# Patient Record
Sex: Female | Born: 1955
Health system: Southern US, Community
[De-identification: ages and names within clinical notes are randomized; demographics above are authoritative.]

## PROBLEM LIST (undated history)

## (undated) DIAGNOSIS — F419 Anxiety disorder, unspecified: Secondary | ICD-10-CM

## (undated) DIAGNOSIS — T7840XA Allergy, unspecified, initial encounter: Secondary | ICD-10-CM

## (undated) DIAGNOSIS — R42 Dizziness and giddiness: Secondary | ICD-10-CM

## (undated) DIAGNOSIS — Z8673 Personal history of transient ischemic attack (TIA), and cerebral infarction without residual deficits: Secondary | ICD-10-CM

## (undated) DIAGNOSIS — E785 Hyperlipidemia, unspecified: Secondary | ICD-10-CM

## (undated) DIAGNOSIS — C801 Malignant (primary) neoplasm, unspecified: Secondary | ICD-10-CM

## (undated) DIAGNOSIS — M1990 Unspecified osteoarthritis, unspecified site: Secondary | ICD-10-CM

## (undated) DIAGNOSIS — M810 Age-related osteoporosis without current pathological fracture: Secondary | ICD-10-CM

## (undated) DIAGNOSIS — R519 Headache, unspecified: Secondary | ICD-10-CM

## (undated) HISTORY — DX: Hyperlipidemia, unspecified: E78.5

## (undated) HISTORY — PX: TOTAL VAGINAL HYSTERECTOMY: SHX2548

## (undated) HISTORY — PX: VAGINAL HYSTERECTOMY: SUR661

## (undated) HISTORY — DX: Headache, unspecified: R51.9

## (undated) HISTORY — PX: MASTECTOMY: SHX3

## (undated) HISTORY — PX: LYMPH NODE BIOPSY: SHX201

## (undated) HISTORY — PX: COLONOSCOPY: SHX174

## (undated) HISTORY — DX: Personal history of transient ischemic attack (TIA), and cerebral infarction without residual deficits: Z86.73

## (undated) HISTORY — PX: BREAST LUMPECTOMY: SHX2

## (undated) HISTORY — PX: CARDIAC CATHETERIZATION: SHX172

## (undated) HISTORY — DX: Age-related osteoporosis without current pathological fracture: M81.0

## (undated) HISTORY — DX: Allergy, unspecified, initial encounter: T78.40XA

## (undated) HISTORY — PX: BREAST CYST ASPIRATION: SHX578

---

## 2004-04-12 DIAGNOSIS — C50919 Malignant neoplasm of unspecified site of unspecified female breast: Secondary | ICD-10-CM

## 2004-04-12 HISTORY — PX: BREAST EXCISIONAL BIOPSY: SUR124

## 2004-04-12 HISTORY — DX: Malignant neoplasm of unspecified site of unspecified female breast: C50.919

## 2004-08-12 ENCOUNTER — Ambulatory Visit: Payer: Self-pay | Admitting: Obstetrics and Gynecology

## 2004-08-20 ENCOUNTER — Ambulatory Visit: Payer: Self-pay | Admitting: General Surgery

## 2004-08-31 ENCOUNTER — Ambulatory Visit: Payer: Self-pay | Admitting: General Surgery

## 2004-08-31 ENCOUNTER — Ambulatory Visit: Payer: Self-pay | Admitting: Surgery

## 2004-09-30 ENCOUNTER — Ambulatory Visit: Payer: Self-pay | Admitting: Gynecologic Oncology

## 2005-01-06 ENCOUNTER — Ambulatory Visit: Payer: Self-pay | Admitting: General Surgery

## 2005-01-18 ENCOUNTER — Ambulatory Visit: Payer: Self-pay | Admitting: General Surgery

## 2005-01-25 ENCOUNTER — Ambulatory Visit: Payer: Self-pay | Admitting: Oncology

## 2005-05-26 ENCOUNTER — Ambulatory Visit: Payer: Self-pay | Admitting: Oncology

## 2005-06-16 ENCOUNTER — Ambulatory Visit: Payer: Self-pay | Admitting: Oncology

## 2005-06-16 ENCOUNTER — Ambulatory Visit: Payer: Self-pay | Admitting: Family Medicine

## 2005-06-22 ENCOUNTER — Encounter: Admission: RE | Admit: 2005-06-22 | Discharge: 2005-06-22 | Payer: Self-pay | Admitting: Oncology

## 2005-09-29 ENCOUNTER — Ambulatory Visit: Payer: Self-pay | Admitting: Oncology

## 2006-01-05 ENCOUNTER — Ambulatory Visit: Payer: Self-pay | Admitting: Oncology

## 2006-03-28 ENCOUNTER — Ambulatory Visit: Payer: Self-pay | Admitting: Gastroenterology

## 2006-04-29 ENCOUNTER — Ambulatory Visit: Payer: Self-pay | Admitting: Oncology

## 2006-06-29 ENCOUNTER — Ambulatory Visit: Payer: Self-pay | Admitting: Oncology

## 2006-07-06 ENCOUNTER — Ambulatory Visit: Payer: Self-pay | Admitting: Oncology

## 2006-07-11 ENCOUNTER — Ambulatory Visit: Payer: Self-pay | Admitting: Family Medicine

## 2006-07-12 ENCOUNTER — Ambulatory Visit: Payer: Self-pay | Admitting: Oncology

## 2006-08-03 ENCOUNTER — Ambulatory Visit: Payer: Self-pay | Admitting: Oncology

## 2006-12-23 ENCOUNTER — Ambulatory Visit: Payer: Self-pay | Admitting: Oncology

## 2007-01-11 ENCOUNTER — Ambulatory Visit: Payer: Self-pay | Admitting: Oncology

## 2007-02-06 ENCOUNTER — Ambulatory Visit: Payer: Self-pay | Admitting: Oncology

## 2007-06-11 ENCOUNTER — Ambulatory Visit: Payer: Self-pay | Admitting: Oncology

## 2007-06-23 ENCOUNTER — Ambulatory Visit: Payer: Self-pay | Admitting: Oncology

## 2007-08-09 ENCOUNTER — Ambulatory Visit: Payer: Self-pay | Admitting: Oncology

## 2007-12-12 ENCOUNTER — Ambulatory Visit: Payer: Self-pay | Admitting: Oncology

## 2007-12-25 ENCOUNTER — Ambulatory Visit: Payer: Self-pay | Admitting: Oncology

## 2008-01-11 ENCOUNTER — Ambulatory Visit: Payer: Self-pay | Admitting: Oncology

## 2008-02-11 ENCOUNTER — Ambulatory Visit: Payer: Self-pay | Admitting: Oncology

## 2008-03-10 ENCOUNTER — Encounter: Admission: RE | Admit: 2008-03-10 | Discharge: 2008-03-10 | Payer: Self-pay | Admitting: Oncology

## 2008-03-12 ENCOUNTER — Ambulatory Visit: Payer: Self-pay | Admitting: Oncology

## 2008-07-11 ENCOUNTER — Ambulatory Visit: Payer: Self-pay | Admitting: Oncology

## 2008-08-06 ENCOUNTER — Ambulatory Visit: Payer: Self-pay | Admitting: Oncology

## 2008-08-10 ENCOUNTER — Ambulatory Visit: Payer: Self-pay | Admitting: Oncology

## 2008-09-10 ENCOUNTER — Ambulatory Visit: Payer: Self-pay | Admitting: Oncology

## 2008-10-01 ENCOUNTER — Ambulatory Visit: Payer: Self-pay | Admitting: Oncology

## 2008-10-10 ENCOUNTER — Ambulatory Visit: Payer: Self-pay | Admitting: Oncology

## 2009-01-29 ENCOUNTER — Ambulatory Visit: Payer: Self-pay | Admitting: Oncology

## 2009-02-04 ENCOUNTER — Ambulatory Visit: Payer: Self-pay | Admitting: Oncology

## 2009-02-10 ENCOUNTER — Ambulatory Visit: Payer: Self-pay | Admitting: Oncology

## 2009-02-11 ENCOUNTER — Ambulatory Visit: Payer: Self-pay | Admitting: Oncology

## 2009-03-12 ENCOUNTER — Ambulatory Visit: Payer: Self-pay | Admitting: Oncology

## 2009-04-12 ENCOUNTER — Ambulatory Visit: Payer: Self-pay | Admitting: Oncology

## 2009-04-12 HISTORY — PX: AUGMENTATION MAMMAPLASTY: SUR837

## 2009-07-28 ENCOUNTER — Ambulatory Visit: Payer: Self-pay | Admitting: Oncology

## 2009-08-04 ENCOUNTER — Encounter: Admission: RE | Admit: 2009-08-04 | Discharge: 2009-08-04 | Payer: Self-pay | Admitting: Oncology

## 2009-08-10 ENCOUNTER — Ambulatory Visit: Payer: Self-pay | Admitting: Oncology

## 2009-08-12 ENCOUNTER — Ambulatory Visit: Payer: Self-pay | Admitting: Oncology

## 2009-09-10 ENCOUNTER — Ambulatory Visit: Payer: Self-pay | Admitting: Oncology

## 2010-02-11 ENCOUNTER — Ambulatory Visit: Payer: Self-pay | Admitting: Surgery

## 2010-02-25 ENCOUNTER — Inpatient Hospital Stay: Payer: Self-pay | Admitting: Surgery

## 2010-03-10 ENCOUNTER — Ambulatory Visit: Payer: Self-pay | Admitting: Cardiovascular Disease

## 2010-05-05 ENCOUNTER — Ambulatory Visit: Payer: Self-pay

## 2010-07-30 ENCOUNTER — Ambulatory Visit: Payer: Self-pay | Admitting: Surgery

## 2011-04-23 ENCOUNTER — Ambulatory Visit: Payer: Self-pay

## 2011-04-29 ENCOUNTER — Ambulatory Visit: Payer: Self-pay | Admitting: Internal Medicine

## 2011-08-19 ENCOUNTER — Ambulatory Visit: Payer: Self-pay | Admitting: Surgery

## 2012-11-14 ENCOUNTER — Ambulatory Visit: Payer: Self-pay | Admitting: Surgery

## 2013-02-21 ENCOUNTER — Ambulatory Visit: Payer: Self-pay | Admitting: Family Medicine

## 2013-11-28 ENCOUNTER — Ambulatory Visit: Payer: Self-pay | Admitting: Obstetrics and Gynecology

## 2014-09-13 ENCOUNTER — Encounter: Payer: Self-pay | Admitting: Family Medicine

## 2014-09-13 ENCOUNTER — Ambulatory Visit (INDEPENDENT_AMBULATORY_CARE_PROVIDER_SITE_OTHER): Payer: BLUE CROSS/BLUE SHIELD | Admitting: Family Medicine

## 2014-09-13 VITALS — BP 120/80 | HR 72 | Ht 63.0 in | Wt 106.0 lb

## 2014-09-13 DIAGNOSIS — C801 Malignant (primary) neoplasm, unspecified: Secondary | ICD-10-CM | POA: Insufficient documentation

## 2014-09-13 DIAGNOSIS — J309 Allergic rhinitis, unspecified: Secondary | ICD-10-CM | POA: Insufficient documentation

## 2014-09-13 DIAGNOSIS — M719 Bursopathy, unspecified: Secondary | ICD-10-CM | POA: Insufficient documentation

## 2014-09-13 DIAGNOSIS — H832X9 Labyrinthine dysfunction, unspecified ear: Secondary | ICD-10-CM

## 2014-09-13 DIAGNOSIS — R079 Chest pain, unspecified: Secondary | ICD-10-CM | POA: Diagnosis not present

## 2014-09-13 DIAGNOSIS — M2240 Chondromalacia patellae, unspecified knee: Secondary | ICD-10-CM | POA: Insufficient documentation

## 2014-09-13 DIAGNOSIS — M5090 Cervical disc disorder, unspecified, unspecified cervical region: Secondary | ICD-10-CM | POA: Insufficient documentation

## 2014-09-13 MED ORDER — MECLIZINE HCL 32 MG PO TABS: 32.0000 mg | ORAL_TABLET | Freq: Three times a day (TID) | ORAL | Status: DC | PRN

## 2014-09-13 NOTE — Progress Notes (Signed)
Name: Amanda Osborn   MRN: [id number]    DOB: 1956/02/02   Date:09/13/2014       Progress Note  Subjective  Chief Complaint  Chief Complaint  Patient presents with  . Dizziness    B/P was elevated yesterday- "feel drunk"  . Chest Pain    Dizziness This is a new problem. The current episode started 1 to 4 weeks ago. The problem occurs intermittently. The problem has been waxing and waning. Associated symptoms include vertigo. Pertinent negatives include no abdominal pain, chest pain, chills, coughing, fatigue, nausea, sore throat or vomiting. The symptoms are aggravated by bending and twisting. She has tried nothing for the symptoms. The treatment provided mild relief.  Chest Pain  This is a new problem. The current episode started in the past 7 days. The onset quality is sudden. The problem has been waxing and waning. The pain is present in the substernal region. The quality of the pain is described as sharp. Associated symptoms include dizziness. Pertinent negatives include no abdominal pain, cough, nausea or vomiting. There are no known risk factors.    No problem-specific assessment & plan notes found for this encounter.   Past Medical History  Diagnosis Date  . Allergy   . Osteoporosis     Past Surgical History  Procedure Laterality Date  . Vaginal hysterectomy    . Breast lumpectomy    . Mastectomy Bilateral     History reviewed. No pertinent family history.  History   Social History  . Marital Status: Married    Spouse Name: N/A  . Number of Children: N/A  . Years of Education: N/A   Occupational History  . Not on file.   Social History Main Topics  . Smoking status: Never Smoker   . Smokeless tobacco: Not on file  . Alcohol Use: No  . Drug Use: No  . Sexual Activity: Not on file   Other Topics Concern  . Not on file   Social History Narrative  . No narrative on file     No Known Allergies   Review of Systems  Constitutional: Negative for  chills and fatigue.  HENT: Negative for sore throat.   Respiratory: Negative for cough.   Cardiovascular: Negative for chest pain.  Gastrointestinal: Negative for nausea, vomiting and abdominal pain.  Neurological: Positive for dizziness and vertigo.     Objective  Filed Vitals:   09/13/14 1502  BP: 120/80  Pulse: 72  Height: 5\' 3"  (1.6 m)  Weight: 106 lb (48.081 kg)    Physical Exam  Constitutional: She is oriented to person, place, and time and well-developed, well-nourished, and in no distress. No distress.  HENT:  Head: Normocephalic.  Right Ear: External ear normal.  Left Ear: External ear normal.  Eyes: Conjunctivae and EOM are normal. Pupils are equal, round, and reactive to light.  Neck: No JVD present.  Cardiovascular: Normal rate and regular rhythm.   No murmur heard. Pulmonary/Chest: Effort normal and breath sounds normal.  Neurological: She is alert and oriented to person, place, and time. She has normal reflexes. No cranial nerve deficit.  Skin: Skin is warm and dry. She is not diaphoretic. No erythema.  Psychiatric: Mood and affect normal.      No results found for this or any previous visit (from the past 2160 hour(s)).   Assessment & Plan  Problem List Items Addressed This Visit    None    Visit Diagnoses    Hyperactive labyrinth, unspecified laterality    -  Primary    Relevant Medications    meclizine (ANTIVERT) 32 MG tablet    Chest pain at rest        Relevant Orders    EKG 12-Lead         Dr. Otilio Miu [facility]  09/13/2014

## 2014-09-23 ENCOUNTER — Other Ambulatory Visit: Payer: Self-pay | Admitting: Unknown Physician Specialty

## 2014-09-23 DIAGNOSIS — R42 Dizziness and giddiness: Secondary | ICD-10-CM

## 2014-09-30 ENCOUNTER — Ambulatory Visit
Admission: RE | Admit: 2014-09-30 | Discharge: 2014-09-30 | Disposition: A | Discharge status: Home / Self Care | Payer: BLUE CROSS/BLUE SHIELD | Source: Ambulatory Visit | Attending: Unknown Physician Specialty | Admitting: Unknown Physician Specialty

## 2014-09-30 DIAGNOSIS — R42 Dizziness and giddiness: Secondary | ICD-10-CM | POA: Diagnosis not present

## 2014-09-30 MED ORDER — GADOBENATE DIMEGLUMINE 529 MG/ML IV SOLN
10.0000 mL | Freq: Once | INTRAVENOUS | Status: AC | PRN
  Administered 2014-09-30: 9 mL via INTRAVENOUS

## 2014-11-01 ENCOUNTER — Other Ambulatory Visit: Payer: Self-pay | Admitting: Obstetrics and Gynecology

## 2014-11-01 DIAGNOSIS — N644 Mastodynia: Secondary | ICD-10-CM

## 2014-11-08 ENCOUNTER — Other Ambulatory Visit: Payer: Self-pay | Admitting: Obstetrics and Gynecology

## 2014-11-08 DIAGNOSIS — N644 Mastodynia: Secondary | ICD-10-CM

## 2014-11-11 ENCOUNTER — Ambulatory Visit
Admission: RE | Admit: 2014-11-11 | Discharge: 2014-11-11 | Disposition: A | Discharge status: Home / Self Care | Payer: BLUE CROSS/BLUE SHIELD | Source: Ambulatory Visit | Attending: Obstetrics and Gynecology | Admitting: Obstetrics and Gynecology

## 2014-11-11 ENCOUNTER — Ambulatory Visit: Payer: BLUE CROSS/BLUE SHIELD

## 2014-11-11 DIAGNOSIS — N644 Mastodynia: Secondary | ICD-10-CM | POA: Insufficient documentation

## 2014-11-11 DIAGNOSIS — Z9882 Breast implant status: Secondary | ICD-10-CM | POA: Insufficient documentation

## 2014-12-03 ENCOUNTER — Other Ambulatory Visit: Payer: Self-pay | Admitting: Obstetrics and Gynecology

## 2014-12-03 DIAGNOSIS — N644 Mastodynia: Secondary | ICD-10-CM

## 2014-12-13 ENCOUNTER — Ambulatory Visit
Admission: RE | Admit: 2014-12-13 | Discharge: 2014-12-13 | Disposition: A | Discharge status: Home / Self Care | Payer: BLUE CROSS/BLUE SHIELD | Source: Ambulatory Visit | Attending: Obstetrics and Gynecology | Admitting: Obstetrics and Gynecology

## 2014-12-13 DIAGNOSIS — N644 Mastodynia: Secondary | ICD-10-CM

## 2014-12-13 MED ORDER — GADOBENATE DIMEGLUMINE 529 MG/ML IV SOLN
9.0000 mL | Freq: Once | INTRAVENOUS | Status: AC | PRN
  Administered 2014-12-13: 9 mL via INTRAVENOUS

## 2015-03-18 ENCOUNTER — Ambulatory Visit (INDEPENDENT_AMBULATORY_CARE_PROVIDER_SITE_OTHER): Payer: BLUE CROSS/BLUE SHIELD | Admitting: Family Medicine

## 2015-03-18 ENCOUNTER — Encounter: Payer: Self-pay | Admitting: Family Medicine

## 2015-03-18 VITALS — BP 120/70 | HR 70 | Ht 63.0 in | Wt 106.0 lb

## 2015-03-18 DIAGNOSIS — J40 Bronchitis, not specified as acute or chronic: Secondary | ICD-10-CM | POA: Diagnosis not present

## 2015-03-18 DIAGNOSIS — J0100 Acute maxillary sinusitis, unspecified: Secondary | ICD-10-CM | POA: Diagnosis not present

## 2015-03-18 DIAGNOSIS — C801 Malignant (primary) neoplasm, unspecified: Secondary | ICD-10-CM | POA: Insufficient documentation

## 2015-03-18 MED ORDER — LEVOFLOXACIN 500 MG PO TABS: 500.0000 mg | ORAL_TABLET | Freq: Every day | ORAL | Status: DC

## 2015-03-18 NOTE — Progress Notes (Signed)
Name: Amanda Osborn   MRN: [id number]    DOB: 07-23-1955   Date:03/18/2015       Progress Note  Subjective  Chief Complaint  Chief Complaint  Patient presents with  . Sinusitis    congestion- went to walk in clinic 6 days ago- gave Amox TID x 10 days- not getting any better    Sinusitis This is a new problem. The current episode started in the past 7 days. The problem has been waxing and waning since onset. The maximum temperature recorded prior to her arrival was 100.4 - 100.9 F. The pain is mild. Associated symptoms include congestion, coughing, diaphoresis, ear pain, headaches, a hoarse voice, shortness of breath, sinus pressure, sneezing and a sore throat. Pertinent negatives include no chills, neck pain or swollen glands. Past treatments include acetaminophen and antibiotics. The treatment provided no relief.  Cough This is a new problem. The current episode started in the past 7 days. The problem has been waxing and waning. The cough is non-productive. Associated symptoms include ear pain, headaches, nasal congestion, postnasal drip, a sore throat and shortness of breath. Pertinent negatives include no chest pain, chills, fever, heartburn, myalgias, rash, weight loss or wheezing. She has tried OTC cough suppressant for the symptoms. The treatment provided no relief. There is no history of asthma, COPD or environmental allergies.    No problem-specific assessment & plan notes found for this encounter.   Past Medical History  Diagnosis Date  . Allergy   . Osteoporosis     Past Surgical History  Procedure Laterality Date  . Vaginal hysterectomy    . Breast lumpectomy    . Mastectomy Bilateral   . Breast excisional biopsy Left 2006    breast ca  . Breast cyst aspiration Bilateral   . Augmentation mammaplasty Bilateral 2011    Family History  Problem Relation Age of Onset  . Breast cancer Paternal Aunt 2  . Breast cancer Paternal Grandmother 58    Social History    Social History  . Marital Status: Married    Spouse Name: N/A  . Number of Children: N/A  . Years of Education: N/A   Occupational History  . Not on file.   Social History Main Topics  . Smoking status: Never Smoker   . Smokeless tobacco: Not on file  . Alcohol Use: No  . Drug Use: No  . Sexual Activity: Yes   Other Topics Concern  . Not on file   Social History Narrative    No Known Allergies   Review of Systems  Constitutional: Positive for diaphoresis. Negative for fever, chills, weight loss and malaise/fatigue.  HENT: Positive for congestion, ear pain, hoarse voice, postnasal drip, sinus pressure, sneezing and sore throat. Negative for ear discharge.   Eyes: Negative for blurred vision.  Respiratory: Positive for cough and shortness of breath. Negative for sputum production and wheezing.   Cardiovascular: Negative for chest pain, palpitations and leg swelling.  Gastrointestinal: Negative for heartburn, nausea, abdominal pain, diarrhea, constipation, blood in stool and melena.  Genitourinary: Negative for dysuria, urgency, frequency and hematuria.  Musculoskeletal: Negative for myalgias, back pain, joint pain and neck pain.  Skin: Negative for rash.  Neurological: Positive for headaches. Negative for dizziness, tingling, sensory change and focal weakness.  Endo/Heme/Allergies: Negative for environmental allergies and polydipsia. Does not bruise/bleed easily.  Psychiatric/Behavioral: Negative for depression and suicidal ideas. The patient is not nervous/anxious and does not have insomnia.      Objective  Filed  Vitals:   03/18/15 1349  BP: 120/70  Pulse: 70  Height: 5\' 3"  (1.6 m)  Weight: 106 lb (48.081 kg)    Physical Exam  Constitutional: She is well-developed, well-nourished, and in no distress. No distress.  HENT:  Head: Normocephalic and atraumatic.  Right Ear: External ear normal.  Left Ear: External ear normal.  Nose: Nose normal.  Mouth/Throat:  Oropharynx is clear and moist.  Eyes: Conjunctivae and EOM are normal. Pupils are equal, round, and reactive to light. Right eye exhibits no discharge. Left eye exhibits no discharge.  Neck: Normal range of motion. Neck supple. No JVD present. No thyromegaly present.  Cardiovascular: Normal rate, regular rhythm, normal heart sounds and intact distal pulses.  Exam reveals no gallop and no friction rub.   No murmur heard. Pulmonary/Chest: Effort normal and breath sounds normal. She has no wheezes. She has no rales. She exhibits no tenderness.  Abdominal: Soft. Bowel sounds are normal. She exhibits no mass. There is no tenderness. There is no guarding.  Musculoskeletal: Normal range of motion. She exhibits no edema.  Lymphadenopathy:    She has no cervical adenopathy.  Neurological: She is alert. She has normal reflexes.  Skin: Skin is warm and dry. She is not diaphoretic.  Psychiatric: Mood and affect normal.      Assessment & Plan  Problem List Items Addressed This Visit    None    Visit Diagnoses    Acute maxillary sinusitis, recurrence not specified    -  Primary    Relevant Medications    levofloxacin (LEVAQUIN) 500 MG tablet    Bronchitis        Relevant Medications    levofloxacin (LEVAQUIN) 500 MG tablet         Dr. Deanna Jones [facility]  03/18/2015

## 2015-03-25 ENCOUNTER — Ambulatory Visit
Admission: RE | Admit: 2015-03-25 | Discharge: 2015-03-25 | Disposition: A | Discharge status: Home / Self Care | Payer: BLUE CROSS/BLUE SHIELD | Source: Ambulatory Visit | Attending: Family Medicine | Admitting: Family Medicine

## 2015-03-25 ENCOUNTER — Encounter: Payer: Self-pay | Admitting: Family Medicine

## 2015-03-25 ENCOUNTER — Ambulatory Visit (INDEPENDENT_AMBULATORY_CARE_PROVIDER_SITE_OTHER): Payer: BLUE CROSS/BLUE SHIELD | Admitting: Family Medicine

## 2015-03-25 VITALS — BP 120/80 | HR 72 | Ht 63.0 in | Wt 106.0 lb

## 2015-03-25 DIAGNOSIS — J0110 Acute frontal sinusitis, unspecified: Secondary | ICD-10-CM

## 2015-03-25 DIAGNOSIS — J40 Bronchitis, not specified as acute or chronic: Secondary | ICD-10-CM

## 2015-03-25 DIAGNOSIS — R509 Fever, unspecified: Secondary | ICD-10-CM | POA: Diagnosis not present

## 2015-03-25 LAB — POCT URINALYSIS DIPSTICK
Bilirubin, UA: NEGATIVE
Glucose, UA: NEGATIVE
Ketones, UA: NEGATIVE
Leukocytes, UA: NEGATIVE
Nitrite, UA: NEGATIVE
PROTEIN UA: NEGATIVE
SPEC GRAV UA: 1.015
UROBILINOGEN UA: 0.2
pH, UA: 6

## 2015-03-25 MED ORDER — DOXYCYCLINE HYCLATE 100 MG PO TABS: 100.0000 mg | ORAL_TABLET | Freq: Two times a day (BID) | ORAL | Status: DC

## 2015-03-25 MED ORDER — MONTELUKAST SODIUM 10 MG PO TABS: 10.0000 mg | ORAL_TABLET | Freq: Every day | ORAL | Status: DC

## 2015-03-25 NOTE — Progress Notes (Signed)
Name: Amanda Osborn   MRN: [id number]    DOB: September 12, 1955   Date:03/25/2015       Progress Note  Subjective  Chief Complaint  Chief Complaint  Patient presents with  . Sinusitis    finished Levaquin yesterday  . Back Pain    noticed a spot of blood in urine- started drinking water- back feels better today    Sinusitis This is a chronic problem. The current episode started 1 to 4 weeks ago. The problem has been gradually worsening since onset. There has been no fever. The pain is mild. Associated symptoms include congestion, coughing, headaches, neck pain, shortness of breath and sinus pressure. Pertinent negatives include no chills, diaphoresis, ear pain, hoarse voice, sneezing, sore throat or swollen glands. Past treatments include antibiotics and oral decongestants. The treatment provided mild relief.  Back Pain Associated symptoms include headaches. Pertinent negatives include no abdominal pain, chest pain, dysuria, fever, tingling or weight loss.    No problem-specific assessment & plan notes found for this encounter.   Past Medical History  Diagnosis Date  . Allergy   . Osteoporosis     Past Surgical History  Procedure Laterality Date  . Vaginal hysterectomy    . Breast lumpectomy    . Mastectomy Bilateral   . Breast excisional biopsy Left 2006    breast ca  . Breast cyst aspiration Bilateral   . Augmentation mammaplasty Bilateral 2011    Family History  Problem Relation Age of Onset  . Breast cancer Paternal Aunt 4  . Breast cancer Paternal Grandmother 18    Social History   Social History  . Marital Status: Married    Spouse Name: N/A  . Number of Children: N/A  . Years of Education: N/A   Occupational History  . Not on file.   Social History Main Topics  . Smoking status: Never Smoker   . Smokeless tobacco: Not on file  . Alcohol Use: No  . Drug Use: No  . Sexual Activity: Yes   Other Topics Concern  . Not on file   Social History  Narrative    No Known Allergies   Review of Systems  Constitutional: Negative for fever, chills, weight loss, malaise/fatigue and diaphoresis.  HENT: Positive for congestion and sinus pressure. Negative for ear discharge, ear pain, hoarse voice, sneezing and sore throat.   Eyes: Negative for blurred vision.  Respiratory: Positive for cough and shortness of breath. Negative for sputum production and wheezing.   Cardiovascular: Negative for chest pain, palpitations and leg swelling.  Gastrointestinal: Negative for heartburn, nausea, abdominal pain, diarrhea, constipation, blood in stool and melena.  Genitourinary: Negative for dysuria, urgency, frequency and hematuria.  Musculoskeletal: Positive for back pain and neck pain. Negative for myalgias and joint pain.  Skin: Negative for rash.  Neurological: Positive for headaches. Negative for dizziness, tingling, sensory change and focal weakness.  Endo/Heme/Allergies: Negative for environmental allergies and polydipsia. Does not bruise/bleed easily.  Psychiatric/Behavioral: Negative for depression and suicidal ideas. The patient is not nervous/anxious and does not have insomnia.      Objective  Filed Vitals:   03/25/15 1059  BP: 120/80  Pulse: 72  Height: 5\' 3"  (1.6 m)  Weight: 106 lb (48.081 kg)  SpO2: 99%    Physical Exam  Constitutional: She is well-developed, well-nourished, and in no distress. No distress.  HENT:  Head: Normocephalic and atraumatic.  Right Ear: External ear normal.  Left Ear: External ear normal.  Nose: Nose normal.  Mouth/Throat: Oropharynx is clear and moist.  Eyes: Conjunctivae and EOM are normal. Pupils are equal, round, and reactive to light. Right eye exhibits no discharge. Left eye exhibits no discharge.  Neck: Normal range of motion. Neck supple. No JVD present. No thyromegaly present.  Cardiovascular: Normal rate, regular rhythm, normal heart sounds and intact distal pulses.  Exam reveals no gallop  and no friction rub.   No murmur heard. Pulmonary/Chest: Effort normal and breath sounds normal. No respiratory distress. She has no wheezes. She has no rales. She exhibits no tenderness.  Abdominal: Soft. Bowel sounds are normal. She exhibits no mass. There is no tenderness. There is no guarding.  Musculoskeletal: Normal range of motion. She exhibits no edema.  Lymphadenopathy:    She has no cervical adenopathy.  Neurological: She is alert.  Skin: Skin is warm and dry. She is not diaphoretic.  Psychiatric: Mood and affect normal.  Nursing note and vitals reviewed.     Assessment & Plan  Problem List Items Addressed This Visit    None    Visit Diagnoses    Bronchitis    -  Primary    Relevant Orders    CBC w/Diff/Platelet    DG Chest 2 View    Acute frontal sinusitis, recurrence not specified        Relevant Medications    doxycycline (VIBRA-TABS) 100 MG tablet    Other Relevant Orders    CBC w/Diff/Platelet    Fever and chills        Relevant Orders    POCT Urinalysis Dipstick (Completed)         Dr. [facility]  03/25/2015

## 2015-03-26 LAB — CBC WITH DIFFERENTIAL/PLATELET
BASOS ABS: 0 10*3/uL (ref 0.0–0.2)
Basos: 1 %
EOS (ABSOLUTE): 0.1 10*3/uL (ref 0.0–0.4)
Eos: 1 %
HEMATOCRIT: 38.8 % (ref 34.0–46.6)
HEMOGLOBIN: 13 g/dL (ref 11.1–15.9)
Immature Grans (Abs): 0 10*3/uL (ref 0.0–0.1)
Immature Granulocytes: 0 %
LYMPHS ABS: 1.9 10*3/uL (ref 0.7–3.1)
LYMPHS: 38 %
MCH: 30.7 pg (ref 26.6–33.0)
MCHC: 33.5 g/dL (ref 31.5–35.7)
MCV: 92 fL (ref 79–97)
MONOS ABS: 0.3 10*3/uL (ref 0.1–0.9)
Monocytes: 5 %
NEUTROS ABS: 2.7 10*3/uL (ref 1.4–7.0)
Neutrophils: 55 %
Platelets: 216 10*3/uL (ref 150–379)
RBC: 4.24 x10E6/uL (ref 3.77–5.28)
RDW: 13.4 % (ref 12.3–15.4)
WBC: 5 10*3/uL (ref 3.4–10.8)

## 2015-03-31 ENCOUNTER — Ambulatory Visit (INDEPENDENT_AMBULATORY_CARE_PROVIDER_SITE_OTHER): Payer: BLUE CROSS/BLUE SHIELD | Admitting: Family Medicine

## 2015-03-31 ENCOUNTER — Encounter: Payer: Self-pay | Admitting: Family Medicine

## 2015-03-31 ENCOUNTER — Ambulatory Visit
Admission: RE | Admit: 2015-03-31 | Discharge: 2015-03-31 | Disposition: A | Discharge status: Home / Self Care | Payer: BLUE CROSS/BLUE SHIELD | Source: Ambulatory Visit | Attending: Family Medicine | Admitting: Family Medicine

## 2015-03-31 VITALS — BP 120/80 | HR 78 | Ht 63.0 in | Wt 106.0 lb

## 2015-03-31 DIAGNOSIS — M5136 Other intervertebral disc degeneration, lumbar region: Secondary | ICD-10-CM | POA: Insufficient documentation

## 2015-03-31 DIAGNOSIS — M519 Unspecified thoracic, thoracolumbar and lumbosacral intervertebral disc disorder: Secondary | ICD-10-CM

## 2015-03-31 DIAGNOSIS — M8580 Other specified disorders of bone density and structure, unspecified site: Secondary | ICD-10-CM | POA: Insufficient documentation

## 2015-03-31 DIAGNOSIS — M4647 Discitis, unspecified, lumbosacral region: Secondary | ICD-10-CM | POA: Diagnosis present

## 2015-03-31 DIAGNOSIS — M545 Low back pain: Secondary | ICD-10-CM | POA: Insufficient documentation

## 2015-03-31 DIAGNOSIS — G8929 Other chronic pain: Secondary | ICD-10-CM | POA: Insufficient documentation

## 2015-03-31 LAB — POCT URINALYSIS DIPSTICK
BILIRUBIN UA: NEGATIVE
Glucose, UA: NEGATIVE
KETONES UA: NEGATIVE
LEUKOCYTES UA: NEGATIVE
NITRITE UA: NEGATIVE
PH UA: 5
PROTEIN UA: NEGATIVE
RBC UA: NEGATIVE
Spec Grav, UA: 1.01
Urobilinogen, UA: 0.2

## 2015-03-31 MED ORDER — CYCLOBENZAPRINE HCL 10 MG PO TABS: 10.0000 mg | ORAL_TABLET | Freq: Three times a day (TID) | ORAL | Status: DC | PRN

## 2015-03-31 MED ORDER — PREDNISONE 10 MG PO TABS: 10.0000 mg | ORAL_TABLET | Freq: Every day | ORAL | Status: DC

## 2015-03-31 MED ORDER — TRAMADOL HCL 50 MG PO TABS: 50.0000 mg | ORAL_TABLET | Freq: Three times a day (TID) | ORAL | Status: DC | PRN

## 2015-03-31 NOTE — Patient Instructions (Addendum)
Sciatica Sciatica is pain, weakness, numbness, or tingling along the path of the sciatic nerve. The nerve starts in the lower back and runs down the back of each leg. The nerve controls the muscles in the lower leg and in the back of the knee, while also providing sensation to the back of the thigh, lower leg, and the sole of your foot. Sciatica is a symptom of another medical condition. For instance, nerve damage or certain conditions, such as a herniated disk or bone spur on the spine, pinch or put pressure on the sciatic nerve. This causes the pain, weakness, or other sensations normally associated with sciatica. Generally, sciatica only affects one side of the body. CAUSES   Herniated or slipped disc.  Degenerative disk disease.  A pain disorder involving the narrow muscle in the buttocks (piriformis syndrome).  Pelvic injury or fracture.  Pregnancy.  Tumor (rare). SYMPTOMS  Symptoms can vary from mild to very severe. The symptoms usually travel from the low back to the buttocks and down the back of the leg. Symptoms can include:  Mild tingling or dull aches in the lower back, leg, or hip.  Numbness in the back of the calf or sole of the foot.  Burning sensations in the lower back, leg, or hip.  Sharp pains in the lower back, leg, or hip.  Leg weakness.  Severe back pain inhibiting movement. These symptoms may get worse with coughing, sneezing, laughing, or prolonged sitting or standing. Also, being overweight may worsen symptoms. DIAGNOSIS  Your caregiver will perform a physical exam to look for common symptoms of sciatica. He or she may ask you to do certain movements or activities that would trigger sciatic nerve pain. Other tests may be performed to find the cause of the sciatica. These may include:  Blood tests.  X-rays.  Imaging tests, such as an MRI or CT scan. TREATMENT  Treatment is directed at the cause of the sciatic pain. Sometimes, treatment is not necessary  and the pain and discomfort goes away on its own. If treatment is needed, your caregiver may suggest:  Over-the-counter medicines to relieve pain.  Prescription medicines, such as anti-inflammatory medicine, muscle relaxants, or narcotics.  Applying heat or ice to the painful area.  Steroid injections to lessen pain, irritation, and inflammation around the nerve.  Reducing activity during periods of pain.  Exercising and stretching to strengthen your abdomen and improve flexibility of your spine. Your caregiver may suggest losing weight if the extra weight makes the back pain worse.  Physical therapy.  Surgery to eliminate what is pressing or pinching the nerve, such as a bone spur or part of a herniated disk. HOME CARE INSTRUCTIONS   Only take over-the-counter or prescription medicines for pain or discomfort as directed by your caregiver.  Apply ice to the affected area for 20 minutes, 3-4 times a day for the first 48-72 hours. Then try heat in the same way.  Exercise, stretch, or perform your usual activities if these do not aggravate your pain.  Attend physical therapy sessions as directed by your caregiver.  Keep all follow-up appointments as directed by your caregiver.  Do not wear high heels or shoes that do not provide proper support.  Check your mattress to see if it is too soft. A firm mattress may lessen your pain and discomfort. SEEK IMMEDIATE MEDICAL CARE IF:   You lose control of your bowel or bladder (incontinence).  You have increasing weakness in the lower back, pelvis, buttocks,   or legs.  You have redness or swelling of your back.  You have a burning sensation when you urinate.  You have pain that gets worse when you lie down or awakens you at night.  Your pain is worse than you have experienced in the past.  Your pain is lasting longer than 4 weeks.  You are suddenly losing weight without reason. MAKE SURE YOU:  Understand these  instructions.  Will watch your condition.  Will get help right away if you are not doing well or get worse.   This information is not intended to replace advice given to you by your health care provider. Make sure you discuss any questions you have with your health care provider.   Document Released: 03/23/2001 Document Revised: 12/18/2014 Document Reviewed: 08/08/2011 Elsevier Interactive Patient Education 2016 Elsevier Inc[employer] Degenerative Disk Disease Degenerative disk disease is a condition caused by the changes that occur in spinal disks as you grow older. Spinal disks are soft and compressible disks located between the bones of your spine (vertebrae). These disks act like shock absorbers. Degenerative disk disease can affect the whole spine. However, the neck and lower back are most commonly affected. Many changes can occur in the spinal disks with aging, such as:  The spinal disks may dry and shrink.  Small tears may occur in the tough, outer covering of the disk (annulus).  The disk space may become smaller due to loss of water.  Abnormal growths in the bone (spurs) may occur. This can put pressure on the nerve roots exiting the spinal canal, causing pain.  The spinal canal may become narrowed. RISK FACTORS   Being overweight.  Having a family history of degenerative disk disease.  Smoking.  There is increased risk if you are doing heavy lifting or have a sudden injury. SIGNS AND SYMPTOMS  Symptoms vary from person to person and may include:  Pain that varies in intensity. Some people have no pain, while others have severe pain. The location of the pain depends on the part of your backbone that is affected.  You will have neck or arm pain if a disk in the neck area is affected.  You will have pain in your back, buttocks, or legs if a disk in the lower back is affected.  Pain that becomes worse while bending, reaching up, or with twisting movements.  Pain that may start  gradually and then get worse as time passes. It may also start after a major or minor injury.  Numbness or tingling in the arms or legs. DIAGNOSIS  Your health care provider will ask you about your symptoms and about activities or habits that may cause the pain. He or she may also ask about any injuries, diseases, or treatments you have had. Your health care provider will examine you to check for the range of movement that is possible in the affected area, to check for strength in your extremities, and to check for sensation in the areas of the arms and legs supplied by different nerve roots. You may also have:   An X-ray of the spine.  Other imaging tests, such as MRI. TREATMENT  Your health care provider will advise you on the best plan for treatment. Treatment may include:  Medicines.  Rehabilitation exercises. HOME CARE INSTRUCTIONS   Follow proper lifting and walking techniques as advised by your health care provider.  Maintain good posture.  Exercise regularly as advised by your health care provider.  Perform relaxation exercises.  Change your sitting, standing, and sleeping habits as advised by your health care provider.  Change positions frequently.  Lose weight or maintain a healthy weight as advised by your health care provider.  Do not use any tobacco products, including cigarettes, chewing tobacco, or electronic cigarettes. If you need help quitting, ask your health care provider.  Wear supportive footwear.  Take medicines only as directed by your health care provider. SEEK MEDICAL CARE IF:   Your pain does not go away within 1-4 weeks.  You have significant appetite or weight loss. SEEK IMMEDIATE MEDICAL CARE IF:   Your pain is severe.  You notice weakness in your arms, hands, or legs.  You begin to lose control of your bladder or bowel movements.  You have fevers or night sweats. MAKE SURE YOU:   Understand these instructions.  Will watch your  condition.  Will get help right away if you are not doing well or get worse.   This information is not intended to replace advice given to you by your health care provider. Make sure you discuss any questions you have with your health care provider.   Document Released: 01/24/2007 Document Revised: 04/19/2014 Document Reviewed: 07/31/2013 Elsevier Interactive Patient Education 2016 Elsevier Inc. Back Pain, Adult Back pain is very common in adults.The cause of back pain is rarely dangerous and the pain often gets better over time.The cause of your back pain may not be known. Some common causes of back pain include:  Strain of the muscles or ligaments supporting the spine.  Wear and tear (degeneration) of the spinal disks.  Arthritis.  Direct injury to the back. For many people, back pain may return. Since back pain is rarely dangerous, most people can learn to manage this condition on their own. HOME CARE INSTRUCTIONS Watch your back pain for any changes. The following actions may help to lessen any discomfort you are feeling:  Remain active. It is stressful on your back to sit or stand in one place for long periods of time. Do not sit, drive, or stand in one place for more than 30 minutes at a time. Take short walks on even surfaces as soon as you are able.Try to increase the length of time you walk each day.  Exercise regularly as directed by your health care provider. Exercise helps your back heal faster. It also helps avoid future injury by keeping your muscles strong and flexible.  Do not stay in bed.Resting more than 1-2 days can delay your recovery.  Pay attention to your body when you bend and lift. The most comfortable positions are those that put less stress on your recovering back. Always use proper lifting techniques, including:  Bending your knees.  Keeping the load close to your body.  Avoiding twisting.  Find a comfortable position to sleep. Use a firm mattress  and lie on your side with your knees slightly bent. If you lie on your back, put a pillow under your knees.  Avoid feeling anxious or stressed.Stress increases muscle tension and can worsen back pain.It is important to recognize when you are anxious or stressed and learn ways to manage it, such as with exercise.  Take medicines only as directed by your health care provider. Over-the-counter medicines to reduce pain and inflammation are often the most helpful.Your health care provider may prescribe muscle relaxant drugs.These medicines help dull your pain so you can more quickly return to your normal activities and healthy exercise.  Apply ice to the injured area:  Put ice in a plastic bag.  Place a towel between your skin and the bag.  Leave the ice on for 20 minutes, 2-3 times a day for the first 2-3 days. After that, ice and heat may be alternated to reduce pain and spasms.  Maintain a healthy weight. Excess weight puts extra stress on your back and makes it difficult to maintain good posture. SEEK MEDICAL CARE IF:  You have pain that is not relieved with rest or medicine.  You have increasing pain going down into the legs or buttocks.  You have pain that does not improve in one week.  You have night pain.  You lose weight.  You have a fever or chills. SEEK IMMEDIATE MEDICAL CARE IF:   You develop new bowel or bladder control problems.  You have unusual weakness or numbness in your arms or legs.  You develop nausea or vomiting.  You develop abdominal pain.  You feel faint.   This information is not intended to replace advice given to you by your health care provider. Make sure you discuss any questions you have with your health care provider.   Document Released: 03/29/2005 Document Revised: 04/19/2014 Document Reviewed: 07/31/2013 Elsevier Interactive Patient Education [employer].

## 2015-03-31 NOTE — Progress Notes (Signed)
Name: Amanda Osborn   MRN: [id number]    DOB: [birth year]   Date:03/31/2015       Progress Note  Subjective  Chief Complaint  Chief Complaint  Patient presents with  . Back Pain    seeing blood in urine now    Back Pain This is a new problem. The current episode started in the past 7 days. The problem occurs constantly. The problem has been waxing and waning since onset. The pain is present in the sacro-iliac and lumbar spine. The quality of the pain is described as aching. The pain radiates to the left thigh. The pain is at a severity of 8/10. The pain is moderate. The symptoms are aggravated by bending and twisting. Pertinent negatives include no abdominal pain, bladder incontinence, bowel incontinence, chest pain, dysuria, fever, headaches, leg pain, numbness, paresis, paresthesias, pelvic pain, perianal numbness, tingling, weakness or weight loss. She has tried heat and NSAIDs for the symptoms. The treatment provided mild relief.    No problem-specific assessment & plan notes found for this encounter.   Past Medical History  Diagnosis Date  . Allergy   . Osteoporosis     Past Surgical History  Procedure Laterality Date  . Vaginal hysterectomy    . Breast lumpectomy    . Mastectomy Bilateral   . Breast excisional biopsy Left 2006    breast ca  . Breast cyst aspiration Bilateral   . Augmentation mammaplasty Bilateral 2011    Family History  Problem Relation Age of Onset  . Breast cancer Paternal Aunt 77  . Breast cancer Paternal Grandmother 67    Social History   Social History  . Marital Status: Married    Spouse Name: N/A  . Number of Children: N/A  . Years of Education: N/A   Occupational History  . Not on file.   Social History Main Topics  . Smoking status: Never Smoker   . Smokeless tobacco: Not on file  . Alcohol Use: No  . Drug Use: No  . Sexual Activity: Yes   Other Topics Concern  . Not on file   Social History Narrative    No Known  Allergies   Review of Systems  Constitutional: Negative for fever, chills, weight loss and malaise/fatigue.  HENT: Negative for ear discharge, ear pain and sore throat.   Eyes: Negative for blurred vision.  Respiratory: Negative for cough, sputum production, shortness of breath and wheezing.   Cardiovascular: Negative for chest pain, palpitations and leg swelling.  Gastrointestinal: Negative for heartburn, nausea, abdominal pain, diarrhea, constipation, blood in stool, melena and bowel incontinence.  Genitourinary: Positive for frequency. Negative for bladder incontinence, dysuria, urgency, hematuria and pelvic pain.  Musculoskeletal: Positive for back pain. Negative for myalgias, joint pain and neck pain.  Skin: Negative for rash.  Neurological: Negative for dizziness, tingling, sensory change, focal weakness, weakness, numbness, headaches and paresthesias.  Endo/Heme/Allergies: Negative for environmental allergies and polydipsia. Does not bruise/bleed easily.  Psychiatric/Behavioral: Negative for depression and suicidal ideas. The patient is not nervous/anxious and does not have insomnia.      Objective  Filed Vitals:   03/31/15 1050  BP: 120/80  Pulse: 78  Height: 5\' 3"  (1.6 m)  Weight: 106 lb (48.081 kg)    Physical Exam  Constitutional: She is well-developed, well-nourished, and in no distress. No distress.  HENT:  Head: Normocephalic and atraumatic.  Right Ear: External ear normal.  Left Ear: External ear normal.  Nose: Nose normal.  Mouth/Throat: Oropharynx is  clear and moist.  Eyes: Conjunctivae and EOM are normal. Pupils are equal, round, and reactive to light. Right eye exhibits no discharge. Left eye exhibits no discharge.  Neck: Normal range of motion. Neck supple. No JVD present. No thyromegaly present.  Cardiovascular: Normal rate, regular rhythm, normal heart sounds and intact distal pulses.  Exam reveals no gallop and no friction rub.   No murmur  heard. Pulmonary/Chest: Effort normal and breath sounds normal.  Abdominal: Soft. Bowel sounds are normal. She exhibits no mass. There is no tenderness. There is no guarding.  Musculoskeletal: Normal range of motion. She exhibits no edema.  Lymphadenopathy:    She has no cervical adenopathy.  Neurological: She is alert. She has normal reflexes.  Skin: Skin is warm and dry. She is not diaphoretic.  Psychiatric: Mood and affect normal.      Assessment & Plan  Problem List Items Addressed This Visit    None    Visit Diagnoses    Lumbosacral disc disease    -  Primary    Relevant Medications    cyclobenzaprine (FLEXERIL) 10 MG tablet    traMADol (ULTRAM) 50 MG tablet    predniSONE (DELTASONE) 10 MG tablet    Other Relevant Orders    DG Lumbar Spine Complete (Completed)    POCT Urinalysis Dipstick (Completed)         Dr. [facility]  03/31/2015

## 2015-04-25 ENCOUNTER — Other Ambulatory Visit: Payer: Self-pay | Admitting: Family Medicine

## 2015-05-16 ENCOUNTER — Encounter: Payer: BLUE CROSS/BLUE SHIELD | Admitting: Physical Therapy

## 2015-05-29 ENCOUNTER — Ambulatory Visit: Payer: BLUE CROSS/BLUE SHIELD

## 2015-05-29 ENCOUNTER — Ambulatory Visit: Payer: BLUE CROSS/BLUE SHIELD | Attending: Neurology

## 2015-05-29 DIAGNOSIS — M545 Low back pain, unspecified: Secondary | ICD-10-CM

## 2015-05-29 NOTE — Therapy (Signed)
[place] MAIN [facility] SERVICES [address] [place], [place], [address] Phone: [phone]   Fax:  [phone]  Physical Therapy Evaluation  Patient Details  Name: Amanda Osborn MRN: [id number] Date of Birth: November 14, 1955 Referring Provider: Dr Dorise Hiss  Encounter Date: 05/29/2015      PT End of Session - 05/29/15 1748    Visit Number 1   Number of Visits 9   Date for PT Re-Evaluation 06/26/15   PT Start Time 1630   PT Stop Time 1730   PT Time Calculation (min) 60 min   Activity Tolerance Patient tolerated treatment well   Behavior During Therapy [facility] for tasks assessed/performed      Past Medical History  Diagnosis Date  . Allergy   . Osteoporosis     Past Surgical History  Procedure Laterality Date  . Vaginal hysterectomy    . Breast lumpectomy    . Mastectomy Bilateral   . Breast excisional biopsy Left 2006    breast ca  . Breast cyst aspiration Bilateral   . Augmentation mammaplasty Bilateral 2011    There were no vitals filed for this visit.  Visit Diagnosis:  Bilateral low back pain without sciatica - Plan: PT plan of care cert/re-cert      Subjective Assessment - 05/29/15 1634    Subjective pt states she had onset of LBP 6 months ago that began after she lifted boxes from the floor and has not changed since onset. pt states that she had Xray that showed thinning space between vertebrae. pt states that the pain travels down leg occasionally and has had no previous back pain. pt states tossing and turning at night increases pain but can usually sleep best on back. pt reports that she walks on treadmill a few miles 3 times a week.   Limitations Sitting;Lifting;Standing   How long can you sit comfortably? 30 to 45 min   How long can you stand comfortably? 30 to 45 min   Currently in Pain? Yes   Pain Score 4    Pain Location Back   Pain Orientation Lower   Pain Descriptors / Indicators Aching;Sharp   Pain Type  Chronic pain   Pain Onset More than a month ago   Aggravating Factors  sitting to standing, lifting boxes   Pain Relieving Factors ibiprofen 600 mg, heat            [facility] PT Assessment - 05/29/15 0001    Assessment   Medical Diagnosis LBP   Referring Provider Dr Dorise Hiss   Onset Date/Surgical Date 11/26/14   Next MD Visit none scheduled   Prior Therapy none   Precautions   Precautions None   Restrictions   Weight Bearing Restrictions No   Balance Screen   Has the patient fallen in the past 6 months No   [place] residence   Living Arrangements Spouse/significant other   Available Help at Discharge Family   Type of [place] to enter   Entrance Stairs-Number of Steps 5   Entrance Stairs-Rails Left   Home Layout Two level   Alternate Level Stairs-Number of Steps 15   Alternate Level Stairs-Rails Right   Home Equipment None   Prior Function   Level of [place] Independent   Vocation Full time employment   Vocation Requirements bending, lifting boxes        POSTURE/OBSERVATION: Pt has slight rounded shoulders bilaterally with  forward head posture and increased lumbar lordosis  PROM/AROM:  Lumbar flexion WNL 50% loss of lumbar extension sidebending WNL but painful to each side STRENGTH:  Graded on a 0-5 scale Muscle Group  Left  Right    Shoulder Flex       Shoulder Abd       Shoulder Ex       Horizontal Abd       Horizontal Add       Elbow Flex      Elbow Ex    Wrist Flex    Wrist Ex    Hip Flex  5 5  Hip Abd  4+ 4+  Hip Add  4 4  Hip Ext  4- 4-  Hip IR/ER       Knee Flex  3+ 4-  Knee Ext  4+ 5  Ankle DF  5 5  Ankle PF        Dermatomes: WNL bilaterally Mytomes: WNL bilaterally  Palpation: Pt had tenderness to palpation near S1 to the coccyx but no reproducible pain. Pt has hypomobility of lumbar spine near L4 to pelvis  SPECIAL TESTS: Elys (-) [place] Mczeal test (+) Prone  instability test (-) SLS (-)     Therapeutic exercise:  Diaphragmatic breathing in supine 10 x 3 sets Lumbar flexion stretch (extended child's pose) hold 25 sec x 2 sets  Pt required mod cues for correct sequencing        PT Education - 05/29/15 1747    Education provided Yes   Education Details pt was educated in plan of care, exam findings, work Writer) Educated Patient   Methods Explanation;Demonstration;Tactile cues;Verbal cues   Comprehension Verbalized understanding;Returned demonstration;Verbal cues required;Tactile cues required             PT Long Term Goals - 05/29/15 1803    PT LONG TERM GOAL #1   Title pt will increase sitting time without pain to 1 hr in order to be able sit at work   Baseline 30 min   Time 4   Period Weeks   Status New   PT LONG TERM GOAL #2   Title pt will have a decrease in LBP to 2/10 moving from sit to stand    Baseline 4/10   Time 4   Period Weeks   Status New   PT LONG TERM GOAL #3   Title pt will be independent in HEP for long term pain relief   Time 4   Period Weeks   Status New   PT LONG TERM GOAL #4   Title pt will be able to lift 25 lbs from floor to chest level with no more than 2/10 pain level   Time 4   Period Weeks   Status New            Plan - 05/29/15 1749    Clinical Impression Statement pt presents with LBP occasionally into buttock bilaterally onset of 6 months ago while lifting boxes at work with no change in symptoms. pt denies any numbness or tingling. pt presents with increased tension of paraspinal muscles in lumbar spine with increased  lumbar lordosis and anterior pelvic tilt. pt demonstrates hip and core weakness, impaired ROM, and reduced flexibility. pt has difficulty and increased pain in positional transfers with an increase in pain in sidebending. Pt demonstrates hypomobility of lumbar spine with PA mobilizations. pt will benefit from skilled therapy in order to decrease  LBP and  return to ADLs.   Pt will benefit from skilled therapeutic intervention in order to improve on the following deficits Decreased activity tolerance;Hypomobility;Pain;Decreased strength;Impaired flexibility;Decreased range of motion   Rehab Potential Good   PT Frequency 2x / week   PT Duration 4 weeks   PT Treatment/Interventions ADLs/Self Care Home Management;Electrical Stimulation;Traction;Ultrasound;Therapeutic exercise;Manual techniques;Neuromuscular re-education;Taping   PT Next Visit Plan HEP   PT Home Exercise Plan diaphragmatic breathing, lumbar flexion stretch (extended child pose)   Consulted and Agree with Plan of Care Patient              Problem List Patient Active Problem List   Diagnosis Date Noted  . Malignant neoplastic disease ([place]) 03/18/2015  . Chondromalacia of patella 09/13/2014  . Bursitis 09/13/2014  . Allergic rhinitis 09/13/2014  . Cancer ([place]) 09/13/2014  . Cervical disc disease 09/13/2014   Domingo Pulse, SPT This entire session was performed under direct supervision and direction of a licensed therapist/therapist assistant . I have personally read, edited and approve of the note as written. Gorden Harms. Tortorici, PT, DPT [phone]  Tortorici,Ashley 05/29/2015, 6:46 PM  [place] MAIN [facility] SERVICES [address] [place], [place], [address] Phone: [phone]   Fax:  [phone]  Name: BRITTLEY SOBERS MRN: [id number] Date of Birth: [birth year]

## 2015-06-08 ENCOUNTER — Other Ambulatory Visit: Payer: Self-pay | Admitting: Family Medicine

## 2015-06-09 ENCOUNTER — Ambulatory Visit: Payer: BLUE CROSS/BLUE SHIELD

## 2015-06-09 DIAGNOSIS — M545 Low back pain, unspecified: Secondary | ICD-10-CM

## 2015-06-09 NOTE — Therapy (Signed)
[place] MAIN [facility] SERVICES [address] [place], [place], [address] Phone: [phone]   Fax:  [phone]  Physical Therapy Treatment  Patient Details  Name: Amanda Osborn MRN: [id number] Date of Birth: Nov 28, 1955 Referring Provider: Dr Dorise Hiss  Encounter Date: 06/09/2015      PT End of Session - 06/09/15 1818    Visit Number 2   Number of Visits 9   Date for PT Re-Evaluation 06/26/15   PT Start Time 1700   PT Stop Time 1745   PT Time Calculation (min) 45 min   Activity Tolerance Patient tolerated treatment well   Behavior During Therapy [facility] for tasks assessed/performed      Past Medical History  Diagnosis Date  . Allergy   . Osteoporosis     Past Surgical History  Procedure Laterality Date  . Vaginal hysterectomy    . Breast lumpectomy    . Mastectomy Bilateral   . Breast excisional biopsy Left 2006    breast ca  . Breast cyst aspiration Bilateral   . Augmentation mammaplasty Bilateral 2011    There were no vitals filed for this visit.  Visit Diagnosis:  Bilateral low back pain without sciatica      Subjective Assessment - 06/09/15 1713    Subjective pt reports her pain is a little better. she reports she has the most difficulty getting out of a chair without pain.    Limitations Sitting;Lifting;Standing   How long can you sit comfortably? 30 to 45 min   How long can you stand comfortably? 30 to 45 min   Currently in Pain? Yes   Pain Score 2    Pain Descriptors / Indicators --  lower back   Pain Onset More than a month ago      therex   Diaphragmatic breathing (initiating TA contraction) with march  Diaphragmatic breathing (initiating TA contraction) with bridge Diaphragmatic breathing (initiating TA contraction) with LTR  Side plank from knees 15s x 3 each side Prone swimmer LE only 2x10  Low row red band 2x10 pavloc press red band 2x10 each side     Pt requires min verbal and tactile cues  for proper exercise performance                              PT Long Term Goals - 05/29/15 1803    PT LONG TERM GOAL #1   Title pt will increase sitting time without pain to 1 hr in order to be able sit at work   Baseline 30 min   Time 4   Period Weeks   Status New   PT LONG TERM GOAL #2   Title pt will have a decrease in LBP to 2/10 moving from sit to stand    Baseline 4/10   Time 4   Period Weeks   Status New   PT LONG TERM GOAL #3   Title pt will be independent in HEP for long term pain relief   Time 4   Period Weeks   Status New   PT LONG TERM GOAL #4   Title pt will be able to lift 25 lbs from floor to chest level with no more than 2/10 pain level   Time 4   Period Weeks   Status New               Plan - 06/09/15 1818    Clinical  Impression Statement pt did well with progression of therex. some pain with prone swimmers with over active paraspinals. PT was able to modify. did include repeated extension exercises due to reported activities of extension preference in history.    Pt will benefit from skilled therapeutic intervention in order to improve on the following deficits Decreased activity tolerance;Hypomobility;Pain;Decreased strength;Impaired flexibility;Decreased range of motion;Dizziness   Rehab Potential Good   PT Frequency 2x / week   PT Duration 4 weeks   PT Treatment/Interventions ADLs/Self Care Home Management;Electrical Stimulation;Traction;Ultrasound;Therapeutic exercise;Manual techniques;Neuromuscular re-education;Taping   PT Next Visit Plan HEP   PT Home Exercise Plan diaphragmatic breathing, lumbar flexion stretch (extended child pose)   Consulted and Agree with Plan of Care Patient        Problem List Patient Active Problem List   Diagnosis Date Noted  . Malignant neoplastic disease ([place]) 03/18/2015  . Chondromalacia of patella 09/13/2014  . Bursitis 09/13/2014  . Allergic rhinitis 09/13/2014  . Cancer ([place])  09/13/2014  . Cervical disc disease 09/13/2014   Gorden Harms. [name], PT, DPT [phone]  [name] 06/09/2015, 6:37 PM  [place] MAIN [facility] SERVICES [address] [place], [place], [address] Phone: [phone]   Fax:  [phone]  Name: Amanda Osborn MRN: [id number] Date of Birth: 08-03-1955

## 2015-06-09 NOTE — Patient Instructions (Addendum)
Diaphragmatic breathing (initiating TA contraction) with march  Diaphragmatic breathing (initiating TA contraction) with bridge Diaphragmatic breathing (initiating TA contraction) with LTR  Side plank from knees 15s x 3 each side Prone swimmer LE only 2x10  Low row red band 2x10 pavloc press red band 2x10 each side

## 2015-06-12 ENCOUNTER — Ambulatory Visit: Payer: BLUE CROSS/BLUE SHIELD

## 2015-06-14 ENCOUNTER — Other Ambulatory Visit: Payer: Self-pay | Admitting: Family Medicine

## 2015-06-16 ENCOUNTER — Ambulatory Visit: Payer: BLUE CROSS/BLUE SHIELD | Attending: Neurology

## 2015-06-16 DIAGNOSIS — M545 Low back pain, unspecified: Secondary | ICD-10-CM

## 2015-06-16 NOTE — Therapy (Signed)
[place] MAIN [facility] SERVICES [address] [place], [place], [address] Phone: [phone]   Fax:  [phone]  Physical Therapy Treatment  Patient Details  Name: Amanda Osborn MRN: [id number] Date of Birth: October 05, 1955 Referring Provider: Dr Dorise Hiss  Encounter Date: 06/16/2015      PT End of Session - 06/16/15 1712    Visit Number 3   Number of Visits 9   Date for PT Re-Evaluation 06/26/15   PT Start Time 1700   PT Stop Time 1730   PT Time Calculation (min) 30 min   Activity Tolerance Patient tolerated treatment well   Behavior During Therapy [facility] for tasks assessed/performed      Past Medical History  Diagnosis Date  . Allergy   . Osteoporosis     Past Surgical History  Procedure Laterality Date  . Vaginal hysterectomy    . Breast lumpectomy    . Mastectomy Bilateral   . Breast excisional biopsy Left 2006    breast ca  . Breast cyst aspiration Bilateral   . Augmentation mammaplasty Bilateral 2011    There were no vitals filed for this visit.  Visit Diagnosis:  Bilateral low back pain without sciatica      Subjective Assessment - 06/16/15 1710    Subjective pt reports her pain is a little better. she reports she has the most difficulty getting out of a chair without pain. she reports she has not done her HEP much   Limitations Sitting;Lifting;Standing   How long can you sit comfortably? 30 to 45 min   How long can you stand comfortably? 30 to 45 min   Currently in Pain? Yes   Pain Score 2    Pain Location Back   Pain Orientation Right;Left   Pain Descriptors / Indicators Aching   Pain Onset More than a month ago   Aggravating Factors  sit to stand           Therex:    Diaphragmatic breathing (initiating TA contraction) with march  Diaphragmatic breathing (initiating TA contraction) with bridge Diaphragmatic breathing (initiating TA contraction) with LTR  Side plank from knees 15s x 3 each  side Prone swimmer LE only 2x10  Low row red band 2x10 pavloc press red band 2x10 each side   Pt requires min verbal and tactile cues for proper exercise performance                     PT Education - 06/16/15 1711    Education provided Yes   Education Details improtance of HEP   Person(s) Educated Patient   Methods Explanation   Comprehension Verbalized understanding             PT Long Term Goals - 05/29/15 1803    PT LONG TERM GOAL #1   Title pt will increase sitting time without pain to 1 hr in order to be able sit at work   Baseline 30 min   Time 4   Period Weeks   Status New   PT LONG TERM GOAL #2   Title pt will have a decrease in LBP to 2/10 moving from sit to stand    Baseline 4/10   Time 4   Period Weeks   Status New   PT LONG TERM GOAL #3   Title pt will be independent in HEP for long term pain relief   Time 4   Period Weeks   Status New  PT LONG TERM GOAL #4   Title pt will be able to lift 25 lbs from floor to chest level with no more than 2/10 pain level   Time 4   Period Weeks   Status New               Plan - 06/16/15 1712    Clinical Impression Statement pt has not made much progress since eval, however she reports she hasnt been very compliant to HEP. PT strongly recommends she resume her HEP. session today consisted of reinstruction of HEP with pt needing mod cues.    Pt will benefit from skilled therapeutic intervention in order to improve on the following deficits Decreased activity tolerance;Hypomobility;Pain;Decreased strength;Impaired flexibility;Decreased range of motion;Dizziness   Rehab Potential Good   PT Frequency 2x / week   PT Duration 4 weeks   PT Treatment/Interventions ADLs/Self Care Home Management;Electrical Stimulation;Traction;Ultrasound;Therapeutic exercise;Manual techniques;Neuromuscular re-education;Taping   PT Next Visit Plan HEP   PT Home Exercise Plan diaphragmatic breathing, lumbar flexion  stretch (extended child pose)   Consulted and Agree with Plan of Care Patient        Problem List Patient Active Problem List   Diagnosis Date Noted  . Malignant neoplastic disease ([place]) 03/18/2015  . Chondromalacia of patella 09/13/2014  . Bursitis 09/13/2014  . Allergic rhinitis 09/13/2014  . Cancer ([place]) 09/13/2014  . Cervical disc disease 09/13/2014   Gorden Harms. Zyeir Dymek, PT, DPT [phone]  [name] 06/16/2015, 5:14 PM  [place] [facility] MAIN [facility] SERVICES [address] [place], [place], [address] Phone: [phone]   Fax:  [phone]  Name: Amanda Osborn MRN: [id number] Date of Birth: Jan 23, 1956

## 2015-06-18 ENCOUNTER — Ambulatory Visit: Payer: BLUE CROSS/BLUE SHIELD

## 2015-06-23 ENCOUNTER — Ambulatory Visit: Payer: BLUE CROSS/BLUE SHIELD

## 2015-06-23 DIAGNOSIS — M545 Low back pain, unspecified: Secondary | ICD-10-CM

## 2015-06-24 NOTE — Therapy (Signed)
[place] MAIN [facility] SERVICES [address] [place], [place], [address] Phone: [phone]   Fax:  [phone]  Physical Therapy Treatment  Patient Details  Name: KAIDEN LICEA MRN: [id number] Date of Birth: [birth year] Referring Provider: Dr Dorise Hiss  Encounter Date: 06/23/2015      PT End of Session - 06/24/15 0914    Visit Number 4   Number of Visits 9   Date for PT Re-Evaluation 06/26/15   Activity Tolerance Patient tolerated treatment well   Behavior During Therapy [facility] for tasks assessed/performed      Past Medical History  Diagnosis Date  . Allergy   . Osteoporosis     Past Surgical History  Procedure Laterality Date  . Vaginal hysterectomy    . Breast lumpectomy    . Mastectomy Bilateral   . Breast excisional biopsy Left 2006    breast ca  . Breast cyst aspiration Bilateral   . Augmentation mammaplasty Bilateral 2011    There were no vitals filed for this visit.  Visit Diagnosis:  Bilateral low back pain without sciatica      Subjective Assessment - 06/24/15 0913    Subjective pt reports her pain is about the same. she reports she did her exercises "some" but not as she should. she feels like her back is weak.    Limitations Sitting;Lifting;Standing   How long can you sit comfortably? 30 to 45 min   How long can you stand comfortably? 30 to 45 min   Currently in Pain? Yes   Pain Score 5    Pain Location Back   Pain Orientation Lower   Pain Onset More than a month ago     MHP to lower back x 5 min prior to manual therapy no charge   Manual therapy: Extensive Soft tissue massage and AISTM using edge tool to lumbar paraspinals including efflurage, muscle kneading and muscle stripping Pt has good fascial mobility CPA mobilizations to L1-5 grade 2-3 2x20s each side  Unilateral PAs to L4-5 grade 3 mobilizations 2 x 15s Sacral distraction 10s hold x 5   Hi Volt Estim to lumbar paraspinals 40mA Bilaterally x  10 min with MHP combo. Pt in prone                          PT Education - 06/24/15 0914    Education provided Yes   Education Details exercsie compliance    Person(s) Educated Patient   Methods Explanation             PT Long Term Goals - 05/29/15 1803    PT LONG TERM GOAL #1   Title pt will increase sitting time without pain to 1 hr in order to be able sit at work   Baseline 30 min   Time 4   Period Weeks   Status New   PT LONG TERM GOAL #2   Title pt will have a decrease in LBP to 2/10 moving from sit to stand    Baseline 4/10   Time 4   Period Weeks   Status New   PT LONG TERM GOAL #3   Title pt will be independent in HEP for long term pain relief   Time 4   Period Weeks   Status New   PT LONG TERM GOAL #4   Title pt will be able to lift 25 lbs from floor to chest level with no more than  2/10 pain level   Time 4   Period Weeks   Status New               Plan - 06/24/15 0915    Clinical Impression Statement pt responded well to manual therapy today reporting no pain at end of session. majority of pain seems to be muscular into lumbar paraspinals and multifidi, like due to over use due to weakness. again encouraged pt to be compliant with HEP despite her busy schedule.    Pt will benefit from skilled therapeutic intervention in order to improve on the following deficits Decreased activity tolerance;Hypomobility;Pain;Decreased strength;Impaired flexibility;Decreased range of motion;Dizziness   Rehab Potential Good   PT Frequency 2x / week   PT Duration 4 weeks   PT Treatment/Interventions ADLs/Self Care Home Management;Electrical Stimulation;Traction;Ultrasound;Therapeutic exercise;Manual techniques;Neuromuscular re-education;Taping   PT Next Visit Plan HEP   PT Home Exercise Plan diaphragmatic breathing, lumbar flexion stretch (extended child pose)   Consulted and Agree with Plan of Care Patient        Problem List Patient Active  Problem List   Diagnosis Date Noted  . Malignant neoplastic disease ([place]) 03/18/2015  . Chondromalacia of patella 09/13/2014  . Bursitis 09/13/2014  . Allergic rhinitis 09/13/2014  . Cancer ([place]) 09/13/2014  . Cervical disc disease 09/13/2014   Gorden Harms. Keyvin Rison, PT, DPT [phone]  Deunte Bledsoe 06/24/2015, 9:48 AM  [place] MAIN [facility] SERVICES [address] [place], [place], [address] Phone: [phone]   Fax:  [phone]  Name: GIULIANNA KRENKE MRN: [id number] Date of Birth: 27-Apr-1955

## 2015-06-25 ENCOUNTER — Ambulatory Visit: Payer: BLUE CROSS/BLUE SHIELD

## 2015-06-25 DIAGNOSIS — M545 Low back pain, unspecified: Secondary | ICD-10-CM

## 2015-06-25 NOTE — Therapy (Signed)
[place] MAIN [facility] SERVICES [address] [place], [place], [address] Phone: [phone]   Fax:  [phone]  Physical Therapy Treatment  Patient Details  Name: ALEXEA GARRAHAN MRN: [id number] Date of Birth: 08-30-1955 Referring Provider: Dr Dorise Hiss  Encounter Date: 06/25/2015      PT End of Session - 06/25/15 1736    Visit Number 5   Number of Visits 9   Date for PT Re-Evaluation 06/26/15   PT Start Time [id number]   PT Stop Time 1802   PT Time Calculation (min) 37 min   Activity Tolerance Patient tolerated treatment well   Behavior During Therapy [facility] for tasks assessed/performed      Past Medical History  Diagnosis Date  . Allergy   . Osteoporosis     Past Surgical History  Procedure Laterality Date  . Vaginal hysterectomy    . Breast lumpectomy    . Mastectomy Bilateral   . Breast excisional biopsy Left 2006    breast ca  . Breast cyst aspiration Bilateral   . Augmentation mammaplasty Bilateral 2011    There were no vitals filed for this visit.  Visit Diagnosis:  Bilateral low back pain without sciatica      Subjective Assessment - 06/25/15 1734    Subjective pt reports her back pain is a little better    Limitations Sitting;Lifting;Standing   How long can you sit comfortably? 30 to 45 min   How long can you stand comfortably? 30 to 45 min   Currently in Pain? Yes   Pain Score 4    Pain Location Back   Pain Orientation Lower   Pain Onset More than a month ago             MHP to lower back x 5 min prior to manual therapy no charge   Manual therapy: Extensive Soft tissue massage to lumbar paraspinals including efflurage, muscle kneading and muscle stripping  CPA mobilizations to L1-5 grade 3 2x20s each level Unilateral PAs to L4-5 grade 3 mobilizations 2 x 15s Sacral distraction 10s hold x 5                                 PT Education - 06/25/15 1736    Education provided  Yes   Education Details exercise complinace    Person(s) Educated Patient   Methods Explanation   Comprehension Verbalized understanding             PT Long Term Goals - 05/29/15 1803    PT LONG TERM GOAL #1   Title pt will increase sitting time without pain to 1 hr in order to be able sit at work   Baseline 30 min   Time 4   Period Weeks   Status New   PT LONG TERM GOAL #2   Title pt will have a decrease in LBP to 2/10 moving from sit to stand    Baseline 4/10   Time 4   Period Weeks   Status New   PT LONG TERM GOAL #3   Title pt will be independent in HEP for long term pain relief   Time 4   Period Weeks   Status New   PT LONG TERM GOAL #4   Title pt will be able to lift 25 lbs from floor to chest level with no more than 2/10 pain level   Time 4  Period Weeks   Status New               Plan - 06/25/15 1803    Clinical Impression Statement pt again responds well to manual therapy, however pt likely continue to have returning symptoms if she is not compliant to her HEP. pt again is asked to do her HEP to carry over results from sessions.    Pt will benefit from skilled therapeutic intervention in order to improve on the following deficits Decreased activity tolerance;Hypomobility;Pain;Decreased strength;Impaired flexibility;Decreased range of motion;Dizziness   Rehab Potential Good   PT Frequency 2x / week   PT Duration 4 weeks   PT Treatment/Interventions ADLs/Self Care Home Management;Electrical Stimulation;Traction;Ultrasound;Therapeutic exercise;Manual techniques;Neuromuscular re-education;Taping   PT Next Visit Plan HEP   PT Home Exercise Plan diaphragmatic breathing, lumbar flexion stretch (extended child pose)   Consulted and Agree with Plan of Care Patient        Problem List Patient Active Problem List   Diagnosis Date Noted  . Malignant neoplastic disease ([place]) 03/18/2015  . Chondromalacia of patella 09/13/2014  . Bursitis 09/13/2014  .  Allergic rhinitis 09/13/2014  . Cancer ([place]) 09/13/2014  . Cervical disc disease 09/13/2014   Gorden Harms. Germaine Shenker, PT, DPT [phone]  Marliyah Reid 06/25/2015, 6:04 PM  [place] MAIN [facility] SERVICES [address] [place], [place], [address] Phone: [phone]   Fax:  [phone]  Name: JENNY MURDICK MRN: [id number] Date of Birth: [birth year]

## 2015-06-26 ENCOUNTER — Ambulatory Visit (INDEPENDENT_AMBULATORY_CARE_PROVIDER_SITE_OTHER): Payer: BLUE CROSS/BLUE SHIELD | Admitting: Family Medicine

## 2015-06-26 ENCOUNTER — Encounter: Payer: Self-pay | Admitting: Family Medicine

## 2015-06-26 VITALS — BP 120/70 | HR 80 | Ht 63.0 in | Wt 109.0 lb

## 2015-06-26 DIAGNOSIS — M5126 Other intervertebral disc displacement, lumbar region: Secondary | ICD-10-CM

## 2015-06-26 DIAGNOSIS — M519 Unspecified thoracic, thoracolumbar and lumbosacral intervertebral disc disorder: Secondary | ICD-10-CM

## 2015-06-26 DIAGNOSIS — M545 Low back pain: Secondary | ICD-10-CM | POA: Diagnosis not present

## 2015-06-26 DIAGNOSIS — M4647 Discitis, unspecified, lumbosacral region: Secondary | ICD-10-CM

## 2015-06-26 DIAGNOSIS — M47816 Spondylosis without myelopathy or radiculopathy, lumbar region: Secondary | ICD-10-CM

## 2015-06-26 MED ORDER — TRAMADOL HCL 50 MG PO TABS: 50.0000 mg | ORAL_TABLET | Freq: Three times a day (TID) | ORAL | Status: DC | PRN

## 2015-06-26 NOTE — Progress Notes (Signed)
Name: Amanda Osborn   MRN: [id number]    DOB: 03-29-1956   Date:06/26/2015       Progress Note  Subjective  Chief Complaint  Chief Complaint  Patient presents with  . Back Pain    going to PT 2 times per week- needs refill on Tramadol    Back Pain This is a recurrent problem. The current episode started more than 1 month ago. The problem occurs intermittently. The problem has been waxing and waning since onset. The pain is present in the lumbar spine. The quality of the pain is described as aching. The pain is at a severity of 9/10. The symptoms are aggravated by bending and twisting. Associated symptoms include leg pain. Pertinent negatives include no abdominal pain, bladder incontinence, bowel incontinence, chest pain, dysuria, fever, headaches, paresthesias, tingling, weakness or weight loss. She has tried analgesics and NSAIDs (physical therapy) for the symptoms. The treatment provided mild relief.    No problem-specific assessment & plan notes found for this encounter.   Past Medical History  Diagnosis Date  . Allergy   . Osteoporosis     Past Surgical History  Procedure Laterality Date  . Vaginal hysterectomy    . Breast lumpectomy    . Mastectomy Bilateral   . Breast excisional biopsy Left 2006    breast ca  . Breast cyst aspiration Bilateral   . Augmentation mammaplasty Bilateral 2011    Family History  Problem Relation Age of Onset  . Breast cancer Paternal Aunt 79  . Breast cancer Paternal Grandmother 50    Social History   Social History  . Marital Status: Married    Spouse Name: N/A  . Number of Children: N/A  . Years of Education: N/A   Occupational History  . Not on file.   Social History Main Topics  . Smoking status: Never Smoker   . Smokeless tobacco: Not on file  . Alcohol Use: No  . Drug Use: No  . Sexual Activity: Yes   Other Topics Concern  . Not on file   Social History Narrative    No Known Allergies   Review of Systems   Constitutional: Negative for fever, chills, weight loss and malaise/fatigue.  HENT: Negative for ear discharge, ear pain and sore throat.   Eyes: Negative for blurred vision.  Respiratory: Negative for cough, sputum production, shortness of breath and wheezing.   Cardiovascular: Negative for chest pain, palpitations and leg swelling.  Gastrointestinal: Negative for heartburn, nausea, abdominal pain, diarrhea, constipation, blood in stool, melena and bowel incontinence.  Genitourinary: Negative for bladder incontinence, dysuria, urgency, frequency and hematuria.  Musculoskeletal: Positive for back pain. Negative for myalgias, joint pain and neck pain.  Skin: Negative for rash.  Neurological: Negative for dizziness, tingling, sensory change, focal weakness, weakness, headaches and paresthesias.  Endo/Heme/Allergies: Negative for environmental allergies and polydipsia. Does not bruise/bleed easily.  Psychiatric/Behavioral: Negative for depression and suicidal ideas. The patient is not nervous/anxious and does not have insomnia.      Objective  Filed Vitals:   06/26/15 1540  BP: 120/70  Pulse: 80  Height: 5\' 3"  (1.6 m)  Weight: 109 lb (49.442 kg)    Physical Exam  Constitutional: She is well-developed, well-nourished, and in no distress. No distress.  HENT:  Head: Normocephalic and atraumatic.  Right Ear: External ear normal.  Left Ear: External ear normal.  Nose: Nose normal.  Mouth/Throat: Oropharynx is clear and moist.  Eyes: Conjunctivae and EOM are normal. Pupils are equal,  round, and reactive to light. Right eye exhibits no discharge. Left eye exhibits no discharge.  Neck: Normal range of motion. Neck supple. No JVD present. No thyromegaly present.  Cardiovascular: Normal rate, regular rhythm, normal heart sounds and intact distal pulses.  Exam reveals no gallop and no friction rub.   No murmur heard. Pulmonary/Chest: Effort normal and breath sounds normal.  Abdominal: Soft.  Bowel sounds are normal. She exhibits no mass. There is no tenderness. There is no guarding.  Musculoskeletal: Normal range of motion. She exhibits no edema.       Lumbar back: She exhibits spasm. She exhibits no tenderness and no bony tenderness.  Lymphadenopathy:    She has no cervical adenopathy.  Neurological: She is alert. She has normal motor skills, normal sensation, normal strength and normal reflexes. She has a normal Straight Leg Raise Test.  Skin: Skin is warm and dry. She is not diaphoretic.  Psychiatric: Mood and affect normal.  Nursing note and vitals reviewed.     Assessment & Plan  Problem List Items Addressed This Visit    None    Visit Diagnoses    Lumbar herniated disc    -  Primary    Lumbosacral disc disease        Relevant Medications    traMADol (ULTRAM) 50 MG tablet    Other Relevant Orders    MR Lumbar Spine Wo Contrast    Facet syndrome, lumbar        Relevant Medications    traMADol (ULTRAM) 50 MG tablet         Dr. Wah [facility]  06/26/2015

## 2015-07-01 ENCOUNTER — Other Ambulatory Visit: Payer: Self-pay

## 2015-07-01 ENCOUNTER — Ambulatory Visit
Admission: RE | Admit: 2015-07-01 | Discharge: 2015-07-01 | Disposition: A | Discharge status: Home / Self Care | Payer: BLUE CROSS/BLUE SHIELD | Source: Ambulatory Visit | Attending: Family Medicine | Admitting: Family Medicine

## 2015-07-01 DIAGNOSIS — M5136 Other intervertebral disc degeneration, lumbar region: Secondary | ICD-10-CM | POA: Insufficient documentation

## 2015-07-01 DIAGNOSIS — M4647 Discitis, unspecified, lumbosacral region: Secondary | ICD-10-CM | POA: Diagnosis not present

## 2015-07-01 DIAGNOSIS — M519 Unspecified thoracic, thoracolumbar and lumbosacral intervertebral disc disorder: Secondary | ICD-10-CM

## 2015-07-22 ENCOUNTER — Encounter: Payer: Self-pay | Admitting: Family Medicine

## 2015-07-22 ENCOUNTER — Ambulatory Visit (INDEPENDENT_AMBULATORY_CARE_PROVIDER_SITE_OTHER): Payer: BLUE CROSS/BLUE SHIELD | Admitting: Family Medicine

## 2015-07-22 VITALS — BP 104/64 | HR 98 | Ht 63.0 in | Wt 107.0 lb

## 2015-07-22 DIAGNOSIS — J40 Bronchitis, not specified as acute or chronic: Secondary | ICD-10-CM | POA: Diagnosis not present

## 2015-07-22 DIAGNOSIS — J0100 Acute maxillary sinusitis, unspecified: Secondary | ICD-10-CM

## 2015-07-22 MED ORDER — DOXYCYCLINE HYCLATE 100 MG PO TABS: 100.0000 mg | ORAL_TABLET | Freq: Two times a day (BID) | ORAL | Status: DC

## 2015-07-22 MED ORDER — FLUTICASONE PROPIONATE 50 MCG/ACT NA SUSP: 2.0000 | Freq: Every day | NASAL | Status: DC

## 2015-07-22 MED ORDER — ALBUTEROL SULFATE HFA 108 (90 BASE) MCG/ACT IN AERS: 2.0000 | INHALATION_SPRAY | Freq: Four times a day (QID) | RESPIRATORY_TRACT | Status: DC | PRN

## 2015-07-22 MED ORDER — MONTELUKAST SODIUM 10 MG PO TABS: 10.0000 mg | ORAL_TABLET | Freq: Every day | ORAL | Status: DC

## 2015-07-22 MED ORDER — GUAIFENESIN-CODEINE 100-10 MG/5ML PO SYRP: 5.0000 mL | ORAL_SOLUTION | Freq: Three times a day (TID) | ORAL | Status: DC | PRN

## 2015-07-22 NOTE — Progress Notes (Signed)
Name: Amanda Osborn   MRN: [id number]    DOB: 1955-07-09   Date:07/22/2015       Progress Note  Subjective  Chief Complaint  Chief Complaint  Patient presents with  . Sinusitis    cough- no production, cong    Sinusitis This is a recurrent problem. The current episode started 1 to 4 weeks ago. The problem has been gradually worsening since onset. There has been no fever. Associated symptoms include congestion, coughing, sinus pressure, sneezing, a sore throat and swollen glands. Pertinent negatives include no chills, diaphoresis, ear pain, headaches, hoarse voice, neck pain or shortness of breath. Past treatments include acetaminophen. The treatment provided mild relief.    No problem-specific assessment & plan notes found for this encounter.   Past Medical History  Diagnosis Date  . Allergy   . Osteoporosis     Past Surgical History  Procedure Laterality Date  . Vaginal hysterectomy    . Breast lumpectomy    . Mastectomy Bilateral   . Breast excisional biopsy Left 2006    breast ca  . Breast cyst aspiration Bilateral   . Augmentation mammaplasty Bilateral 2011    Family History  Problem Relation Age of Onset  . Breast cancer Paternal Aunt 70  . Breast cancer Paternal Grandmother 66    Social History   Social History  . Marital Status: Married    Spouse Name: N/A  . Number of Children: N/A  . Years of Education: N/A   Occupational History  . Not on file.   Social History Main Topics  . Smoking status: Never Smoker   . Smokeless tobacco: Not on file  . Alcohol Use: No  . Drug Use: No  . Sexual Activity: Yes   Other Topics Concern  . Not on file   Social History Narrative    No Known Allergies   Review of Systems  Constitutional: Negative for fever, chills, weight loss, malaise/fatigue and diaphoresis.  HENT: Positive for congestion, sinus pressure, sneezing and sore throat. Negative for ear discharge, ear pain and hoarse voice.   Eyes: Negative  for blurred vision.  Respiratory: Positive for cough. Negative for sputum production, shortness of breath and wheezing.   Cardiovascular: Negative for chest pain, palpitations and leg swelling.  Gastrointestinal: Negative for heartburn, nausea, abdominal pain, diarrhea, constipation, blood in stool and melena.  Genitourinary: Negative for dysuria, urgency, frequency and hematuria.  Musculoskeletal: Negative for myalgias, back pain, joint pain and neck pain.  Skin: Negative for rash.  Neurological: Negative for dizziness, tingling, sensory change, focal weakness and headaches.  Endo/Heme/Allergies: Negative for environmental allergies and polydipsia. Does not bruise/bleed easily.  Psychiatric/Behavioral: Negative for depression and suicidal ideas. The patient is not nervous/anxious and does not have insomnia.      Objective  Filed Vitals:   07/22/15 1107  BP: 104/64  Pulse: 98  Height: 5\' 3"  (1.6 m)  Weight: 107 lb (48.535 kg)    Physical Exam  Constitutional: She is well-developed, well-nourished, and in no distress. No distress.  HENT:  Head: Normocephalic and atraumatic.  Right Ear: External ear normal.  Left Ear: External ear normal.  Nose: Nose normal.  Mouth/Throat: Oropharynx is clear and moist.  Eyes: Conjunctivae and EOM are normal. Pupils are equal, round, and reactive to light. Right eye exhibits no discharge. Left eye exhibits no discharge.  Neck: Normal range of motion. Neck supple. No JVD present. No thyromegaly present.  Cardiovascular: Normal rate, regular rhythm, normal heart sounds and intact  distal pulses.  Exam reveals no gallop and no friction rub.   No murmur heard. Pulmonary/Chest: Effort normal and breath sounds normal.  Abdominal: Soft. Bowel sounds are normal. She exhibits no mass. There is no tenderness. There is no guarding.  Musculoskeletal: Normal range of motion. She exhibits no edema.  Lymphadenopathy:    She has no cervical adenopathy.   Neurological: She is alert. She has normal reflexes.  Skin: Skin is warm and dry. She is not diaphoretic.  Psychiatric: Mood and affect normal.  Nursing note and vitals reviewed.     Assessment & Plan  Problem List Items Addressed This Visit    None    Visit Diagnoses    Bronchitis    -  Primary    Relevant Medications    montelukast (SINGULAIR) 10 MG tablet    doxycycline (VIBRA-TABS) 100 MG tablet    albuterol (PROVENTIL HFA;VENTOLIN HFA) 108 (90 Base) MCG/ACT inhaler    guaiFENesin-codeine (ROBITUSSIN AC) 100-10 MG/5ML syrup    fluticasone (FLONASE) 50 MCG/ACT nasal spray    Acute maxillary sinusitis, recurrence not specified        Relevant Medications    montelukast (SINGULAIR) 10 MG tablet    doxycycline (VIBRA-TABS) 100 MG tablet    albuterol (PROVENTIL HFA;VENTOLIN HFA) 108 (90 Base) MCG/ACT inhaler    guaiFENesin-codeine (ROBITUSSIN AC) 100-10 MG/5ML syrup    fluticasone (FLONASE) 50 MCG/ACT nasal spray         Dr. Tiney Zipper [facility]  07/22/2015

## 2015-08-06 DIAGNOSIS — M5136 Other intervertebral disc degeneration, lumbar region: Secondary | ICD-10-CM | POA: Diagnosis not present

## 2015-08-06 DIAGNOSIS — M5416 Radiculopathy, lumbar region: Secondary | ICD-10-CM | POA: Diagnosis not present

## 2015-08-28 DIAGNOSIS — M5136 Other intervertebral disc degeneration, lumbar region: Secondary | ICD-10-CM | POA: Diagnosis not present

## 2015-08-28 DIAGNOSIS — M5416 Radiculopathy, lumbar region: Secondary | ICD-10-CM | POA: Diagnosis not present

## 2015-09-29 DIAGNOSIS — M545 Low back pain: Secondary | ICD-10-CM | POA: Diagnosis not present

## 2015-09-29 DIAGNOSIS — G8929 Other chronic pain: Secondary | ICD-10-CM | POA: Diagnosis not present

## 2015-09-29 DIAGNOSIS — M5126 Other intervertebral disc displacement, lumbar region: Secondary | ICD-10-CM | POA: Diagnosis not present

## 2015-11-06 ENCOUNTER — Other Ambulatory Visit: Payer: Self-pay | Admitting: Family Medicine

## 2015-11-06 DIAGNOSIS — J0100 Acute maxillary sinusitis, unspecified: Secondary | ICD-10-CM

## 2015-11-06 DIAGNOSIS — J40 Bronchitis, not specified as acute or chronic: Secondary | ICD-10-CM

## 2016-01-01 DIAGNOSIS — N941 Unspecified dyspareunia: Secondary | ICD-10-CM | POA: Diagnosis not present

## 2016-01-01 DIAGNOSIS — N952 Postmenopausal atrophic vaginitis: Secondary | ICD-10-CM | POA: Diagnosis not present

## 2016-01-01 DIAGNOSIS — M810 Age-related osteoporosis without current pathological fracture: Secondary | ICD-10-CM | POA: Diagnosis not present

## 2016-01-01 DIAGNOSIS — Z01411 Encounter for gynecological examination (general) (routine) with abnormal findings: Secondary | ICD-10-CM | POA: Diagnosis not present

## 2016-01-01 DIAGNOSIS — Z1211 Encounter for screening for malignant neoplasm of colon: Secondary | ICD-10-CM | POA: Diagnosis not present

## 2016-01-05 ENCOUNTER — Other Ambulatory Visit: Payer: Self-pay | Admitting: Obstetrics and Gynecology

## 2016-01-05 DIAGNOSIS — Z1239 Encounter for other screening for malignant neoplasm of breast: Secondary | ICD-10-CM

## 2016-01-23 ENCOUNTER — Ambulatory Visit
Admission: RE | Admit: 2016-01-23 | Discharge: 2016-01-23 | Disposition: A | Discharge status: Home / Self Care | Payer: BLUE CROSS/BLUE SHIELD | Source: Ambulatory Visit | Attending: Obstetrics and Gynecology | Admitting: Obstetrics and Gynecology

## 2016-01-23 DIAGNOSIS — Z1239 Encounter for other screening for malignant neoplasm of breast: Secondary | ICD-10-CM

## 2016-01-23 DIAGNOSIS — M810 Age-related osteoporosis without current pathological fracture: Secondary | ICD-10-CM | POA: Diagnosis not present

## 2016-01-23 DIAGNOSIS — Z853 Personal history of malignant neoplasm of breast: Secondary | ICD-10-CM | POA: Diagnosis not present

## 2016-01-23 MED ORDER — GADOBENATE DIMEGLUMINE 529 MG/ML IV SOLN
9.0000 mL | Freq: Once | INTRAVENOUS | Status: AC | PRN
  Administered 2016-01-23: 9 mL via INTRAVENOUS

## 2016-02-04 ENCOUNTER — Other Ambulatory Visit: Payer: Self-pay | Admitting: Family Medicine

## 2016-02-04 DIAGNOSIS — J40 Bronchitis, not specified as acute or chronic: Secondary | ICD-10-CM

## 2016-02-04 DIAGNOSIS — J0100 Acute maxillary sinusitis, unspecified: Secondary | ICD-10-CM

## 2016-02-09 ENCOUNTER — Ambulatory Visit (INDEPENDENT_AMBULATORY_CARE_PROVIDER_SITE_OTHER): Payer: BLUE CROSS/BLUE SHIELD | Admitting: Family Medicine

## 2016-02-09 ENCOUNTER — Encounter: Payer: Self-pay | Admitting: Family Medicine

## 2016-02-09 VITALS — BP 122/82 | HR 64 | Temp 98.1°F | Ht 63.0 in | Wt 110.0 lb

## 2016-02-09 DIAGNOSIS — J0100 Acute maxillary sinusitis, unspecified: Secondary | ICD-10-CM | POA: Diagnosis not present

## 2016-02-09 MED ORDER — GUAIFENESIN-CODEINE 100-10 MG/5ML PO SYRP: 5.0000 mL | ORAL_SOLUTION | Freq: Three times a day (TID) | ORAL | 0 refills | Status: DC | PRN

## 2016-02-09 MED ORDER — FLUTICASONE PROPIONATE 50 MCG/ACT NA SUSP: 2.0000 | Freq: Every day | NASAL | 6 refills | Status: DC

## 2016-02-09 MED ORDER — CEFUROXIME AXETIL 500 MG PO TABS: 500.0000 mg | ORAL_TABLET | Freq: Two times a day (BID) | ORAL | 0 refills | Status: DC

## 2016-02-09 NOTE — Progress Notes (Signed)
Name: ASAL TIGNOR   MRN: [id number]    DOB: 1955-11-16   Date:02/09/2016       Progress Note  Subjective  Chief Complaint  Chief Complaint  Patient presents with  . Allergic Rhinitis     "messed in flowers" aggravated allergies. Has taken 8 days of Doxy- feels better but "can't get over the hump"    Sinusitis  This is a new problem. The current episode started in the past 7 days. The problem has been waxing and waning since onset. The fever has been present for 5 days or more. Her pain is at a severity of 6/10. The pain is moderate. Pertinent negatives include no chills, congestion, coughing, diaphoresis, ear pain, headaches, hoarse voice, neck pain, shortness of breath, sinus pressure, sneezing, sore throat or swollen glands. Past treatments include nothing. The treatment provided moderate relief.    No problem-specific Assessment & Plan notes found for this encounter.   Past Medical History:  Diagnosis Date  . Allergy   . Osteoporosis     Past Surgical History:  Procedure Laterality Date  . AUGMENTATION MAMMAPLASTY Bilateral 2011  . BREAST CYST ASPIRATION Bilateral   . BREAST EXCISIONAL BIOPSY Left 2006   breast ca  . BREAST LUMPECTOMY    . MASTECTOMY Bilateral   . VAGINAL HYSTERECTOMY      Family History  Problem Relation Age of Onset  . Breast cancer Paternal Aunt 78  . Breast cancer Paternal Grandmother 41    Social History   Social History  . Marital status: Married    Spouse name: N/A  . Number of children: N/A  . Years of education: N/A   Occupational History  . Not on file.   Social History Main Topics  . Smoking status: Never Smoker  . Smokeless tobacco: Not on file  . Alcohol use No  . Drug use: No  . Sexual activity: Yes   Other Topics Concern  . Not on file   Social History Narrative  . No narrative on file    Allergies  Allergen Reactions  . Singulair [Montelukast]     headache     Review of Systems  Constitutional:  Negative for chills, diaphoresis, fever, malaise/fatigue and weight loss.  HENT: Negative for congestion, ear discharge, ear pain, hoarse voice, sinus pressure, sneezing and sore throat.   Eyes: Negative for blurred vision.  Respiratory: Negative for cough, sputum production, shortness of breath and wheezing.   Cardiovascular: Negative for chest pain, palpitations and leg swelling.  Gastrointestinal: Negative for abdominal pain, blood in stool, constipation, diarrhea, heartburn, melena and nausea.  Genitourinary: Negative for dysuria, frequency, hematuria and urgency.  Musculoskeletal: Negative for back pain, joint pain, myalgias and neck pain.  Skin: Negative for rash.  Neurological: Negative for dizziness, tingling, sensory change, focal weakness and headaches.  Endo/Heme/Allergies: Negative for environmental allergies and polydipsia. Does not bruise/bleed easily.  Psychiatric/Behavioral: Negative for depression and suicidal ideas. The patient is not nervous/anxious and does not have insomnia.      Objective  Vitals:   02/09/16 1335  BP: 122/82  Pulse: 64  Temp: 98.1 F (36.7 C)  SpO2: 99%  Weight: 110 lb (49.9 kg)  Height: 5\' 3"  (1.6 m)    Physical Exam  Constitutional: She is well-developed, well-nourished, and in no distress. No distress.  HENT:  Head: Normocephalic and atraumatic.  Right Ear: External ear normal.  Left Ear: External ear normal.  Nose: Nose normal.  Mouth/Throat: Oropharynx is clear and moist.  Eyes: Conjunctivae and EOM are normal. Pupils are equal, round, and reactive to light. Right eye exhibits no discharge. Left eye exhibits no discharge.  Neck: Normal range of motion. Neck supple. No JVD present. No thyromegaly present.  Cardiovascular: Normal rate, regular rhythm, normal heart sounds and intact distal pulses.  Exam reveals no gallop and no friction rub.   No murmur heard. Pulmonary/Chest: Effort normal and breath sounds normal. She has no wheezes.  She has no rales.  Abdominal: Soft. Bowel sounds are normal. She exhibits no mass. There is no tenderness. There is no guarding.  Musculoskeletal: Normal range of motion. She exhibits no edema.  Lymphadenopathy:    She has no cervical adenopathy.  Neurological: She is alert.  Skin: Skin is warm and dry. She is not diaphoretic.  Psychiatric: Mood and affect normal.  Nursing note and vitals reviewed.     Assessment & Plan  Problem List Items Addressed This Visit    None    Visit Diagnoses    Acute maxillary sinusitis, recurrence not specified    -  Primary   adding sudafed to loratadine   Relevant Medications   cefUROXime (CEFTIN) 500 MG tablet   guaiFENesin-codeine (ROBITUSSIN AC) 100-10 MG/5ML syrup   fluticasone (FLONASE) 50 MCG/ACT nasal spray        Dr. Carlisia Geno [facility]  02/09/16

## 2016-02-27 DIAGNOSIS — Z1211 Encounter for screening for malignant neoplasm of colon: Secondary | ICD-10-CM | POA: Diagnosis not present

## 2016-03-08 ENCOUNTER — Encounter: Payer: Self-pay | Admitting: Internal Medicine

## 2016-03-08 ENCOUNTER — Ambulatory Visit (INDEPENDENT_AMBULATORY_CARE_PROVIDER_SITE_OTHER): Payer: BLUE CROSS/BLUE SHIELD | Admitting: Internal Medicine

## 2016-03-08 VITALS — BP 118/82 | HR 84 | Temp 98.3°F | Resp 16 | Ht 63.0 in | Wt 109.0 lb

## 2016-03-08 DIAGNOSIS — J0110 Acute frontal sinusitis, unspecified: Secondary | ICD-10-CM | POA: Diagnosis not present

## 2016-03-08 MED ORDER — SULFAMETHOXAZOLE-TRIMETHOPRIM 800-160 MG PO TABS: 1.0000 | ORAL_TABLET | Freq: Two times a day (BID) | ORAL | 0 refills | Status: DC

## 2016-03-08 NOTE — Progress Notes (Signed)
Date:  03/08/2016   Name:  Amanda Osborn   DOB:  [birth year]   MRN:  [id number]   Chief Complaint: Allergies (Feels this is from dust and sinuses. ) Sinus Problem  This is a recurrent problem. The current episode started in the past 7 days. The problem has been rapidly worsening since onset. There has been no fever. Associated symptoms include congestion, coughing, sinus pressure and a sore throat. Pertinent negatives include no chills or ear pain. Past treatments include spray decongestants and oral decongestants (claritin and singulair). The treatment provided no relief.  Started after being in her attic to get out Christmas decorations.  Review of Systems  Constitutional: Negative for chills, fever and unexpected weight change.  HENT: Positive for congestion, sinus pressure, sore throat and voice change. Negative for ear pain, hearing loss and trouble swallowing.   Respiratory: Positive for cough. Negative for chest tightness, wheezing and stridor.   Cardiovascular: Negative for chest pain and palpitations.  Neurological: Negative for dizziness.    Patient Active Problem List   Diagnosis Date Noted  . Malignant neoplastic disease ([place]) 03/18/2015  . Chondromalacia of patella 09/13/2014  . Bursitis 09/13/2014  . Allergic rhinitis 09/13/2014  . Cancer ([place]) 09/13/2014  . Cervical disc disease 09/13/2014    Prior to Admission medications   Medication Sig Start Date End Date Taking? Authorizing Provider  acyclovir (ZOVIRAX) 800 MG tablet Take 1 tablet by mouth 2 (two) times daily.   Yes Historical Provider, MD  albuterol (PROVENTIL HFA;VENTOLIN HFA) 108 (90 Base) MCG/ACT inhaler Inhale 2 puffs into the lungs every 6 (six) hours as needed for wheezing or shortness of breath. 07/22/15  Yes Juline Patch, MD  fluticasone (FLONASE) 50 MCG/ACT nasal spray Place 2 sprays into both nostrils daily. 07/22/15  Yes Juline Patch, MD  guaiFENesin-codeine (ROBITUSSIN AC) 100-10 MG/5ML syrup  Take 5 mLs by mouth 3 (three) times daily as needed for cough. 02/09/16  Yes Juline Patch, MD  ibandronate (BONIVA) 150 MG tablet Take 1 tablet by mouth every 30 (thirty) days. 11/28/13  Yes Historical Provider, MD  loratadine (CLARITIN) 10 MG tablet Take 1 tablet by mouth daily. otc   Yes Historical Provider, MD  montelukast (SINGULAIR) 10 MG tablet Take 10 mg by mouth at bedtime.   Yes Historical Provider, MD  Multiple Vitamin (MULTI-VITAMINS) TABS Take 1 tablet by mouth daily.   Yes Historical Provider, MD  polyethylene glycol powder (GLYCOLAX/MIRALAX) powder Take by mouth. 02/27/16  Yes Historical Provider, MD  zolpidem (AMBIEN) 5 MG tablet Take by mouth. 02/24/16  Yes Historical Provider, MD    No Known Allergies  Past Surgical History:  Procedure Laterality Date  . AUGMENTATION MAMMAPLASTY Bilateral 2011  . BREAST CYST ASPIRATION Bilateral   . BREAST EXCISIONAL BIOPSY Left 2006   breast ca  . BREAST LUMPECTOMY    . MASTECTOMY Bilateral   . VAGINAL HYSTERECTOMY      Social History  Substance Use Topics  . Smoking status: Never Smoker  . Smokeless tobacco: Never Used  . Alcohol use No     Medication list has been reviewed and updated.   Physical Exam  Constitutional: She is oriented to person, place, and time. She appears well-developed and well-nourished.  HENT:  Right Ear: Tympanic membrane, external ear and ear canal normal. Tympanic membrane is not erythematous and not retracted.  Left Ear: Tympanic membrane, external ear and ear canal normal. Tympanic membrane is not erythematous and not retracted.  Nose: Right sinus exhibits frontal sinus tenderness. Right sinus exhibits no maxillary sinus tenderness. Left sinus exhibits frontal sinus tenderness. Left sinus exhibits no maxillary sinus tenderness.  Mouth/Throat: Uvula is midline and mucous membranes are normal. No oral lesions. Posterior oropharyngeal erythema present. No oropharyngeal exudate.  Cardiovascular:  Normal rate, regular rhythm and normal heart sounds.   Pulmonary/Chest: Breath sounds normal. She has no wheezes. She has no rales.  Lymphadenopathy:    She has no cervical adenopathy.  Neurological: She is alert and oriented to person, place, and time.    BP 118/82   Pulse 84   Temp 98.3 F (36.8 C) (Oral)   Resp 16   Ht 5\' 3"  (1.6 m)   Wt 109 lb (49.4 kg)   SpO2 98%   BMI 19.31 kg/m   Assessment and Plan: 1. Acute non-recurrent frontal sinusitis Continue singulair, flonase sudafed and claritin Consider ENT/allergy evaluation for recurrent sinus infections - sulfamethoxazole-trimethoprim (BACTRIM DS,SEPTRA DS) 800-160 MG tablet; Take 1 tablet by mouth 2 (two) times daily.  Dispense: 20 tablet; Refill: 0   Halina Maidens, MD [facility]  03/08/2016

## 2016-03-17 DIAGNOSIS — R5383 Other fatigue: Secondary | ICD-10-CM | POA: Diagnosis not present

## 2016-03-17 DIAGNOSIS — J309 Allergic rhinitis, unspecified: Secondary | ICD-10-CM | POA: Diagnosis not present

## 2016-03-17 DIAGNOSIS — R51 Headache: Secondary | ICD-10-CM | POA: Diagnosis not present

## 2016-03-23 ENCOUNTER — Encounter: Payer: Self-pay | Admitting: Student

## 2016-04-27 DIAGNOSIS — R42 Dizziness and giddiness: Secondary | ICD-10-CM | POA: Diagnosis not present

## 2016-04-27 DIAGNOSIS — F5104 Psychophysiologic insomnia: Secondary | ICD-10-CM | POA: Diagnosis not present

## 2016-04-27 DIAGNOSIS — R51 Headache: Secondary | ICD-10-CM | POA: Diagnosis not present

## 2016-04-27 DIAGNOSIS — G43109 Migraine with aura, not intractable, without status migrainosus: Secondary | ICD-10-CM | POA: Diagnosis not present

## 2016-05-03 ENCOUNTER — Other Ambulatory Visit: Payer: Self-pay | Admitting: Neurology

## 2016-05-03 DIAGNOSIS — R42 Dizziness and giddiness: Secondary | ICD-10-CM

## 2016-05-07 ENCOUNTER — Other Ambulatory Visit
Admission: RE | Admit: 2016-05-07 | Discharge: 2016-05-07 | Disposition: A | Discharge status: Home / Self Care | Payer: BLUE CROSS/BLUE SHIELD | Source: Ambulatory Visit | Attending: Neurology | Admitting: Neurology

## 2016-05-07 ENCOUNTER — Ambulatory Visit
Admission: RE | Admit: 2016-05-07 | Discharge: 2016-05-07 | Disposition: A | Discharge status: Home / Self Care | Payer: BLUE CROSS/BLUE SHIELD | Source: Ambulatory Visit | Attending: Neurology | Admitting: Neurology

## 2016-05-07 DIAGNOSIS — R51 Headache: Secondary | ICD-10-CM | POA: Diagnosis not present

## 2016-05-07 DIAGNOSIS — G43109 Migraine with aura, not intractable, without status migrainosus: Secondary | ICD-10-CM | POA: Diagnosis not present

## 2016-05-07 DIAGNOSIS — Z8673 Personal history of transient ischemic attack (TIA), and cerebral infarction without residual deficits: Secondary | ICD-10-CM | POA: Insufficient documentation

## 2016-05-07 DIAGNOSIS — R9082 White matter disease, unspecified: Secondary | ICD-10-CM | POA: Diagnosis not present

## 2016-05-07 DIAGNOSIS — R42 Dizziness and giddiness: Secondary | ICD-10-CM | POA: Insufficient documentation

## 2016-05-07 LAB — CREATININE, SERUM: Creatinine, Ser: 0.82 mg/dL (ref 0.44–1.00)

## 2016-05-07 LAB — BUN: BUN: 27 mg/dL — AB (ref 6–20)

## 2016-05-07 MED ORDER — GADOBENATE DIMEGLUMINE 529 MG/ML IV SOLN
9.0000 mL | Freq: Once | INTRAVENOUS | Status: AC | PRN
  Administered 2016-05-07: 9 mL via INTRAVENOUS

## 2016-06-25 ENCOUNTER — Encounter: Payer: Self-pay | Admitting: *Deleted

## 2016-06-28 ENCOUNTER — Encounter: Payer: Self-pay | Admitting: Anesthesiology

## 2016-06-28 ENCOUNTER — Ambulatory Visit: Payer: BLUE CROSS/BLUE SHIELD | Admitting: Anesthesiology

## 2016-06-28 ENCOUNTER — Ambulatory Visit
Admission: RE | Admit: 2016-06-28 | Discharge: 2016-06-28 | Disposition: A | Discharge status: Home / Self Care | Payer: BLUE CROSS/BLUE SHIELD | Source: Ambulatory Visit | Attending: Gastroenterology | Admitting: Gastroenterology

## 2016-06-28 ENCOUNTER — Encounter
Admission: RE | Disposition: A | Discharge status: Home / Self Care | Payer: Self-pay | Source: Ambulatory Visit | Attending: Gastroenterology

## 2016-06-28 DIAGNOSIS — Z79899 Other long term (current) drug therapy: Secondary | ICD-10-CM | POA: Insufficient documentation

## 2016-06-28 DIAGNOSIS — Z1211 Encounter for screening for malignant neoplasm of colon: Secondary | ICD-10-CM | POA: Diagnosis not present

## 2016-06-28 DIAGNOSIS — M810 Age-related osteoporosis without current pathological fracture: Secondary | ICD-10-CM | POA: Insufficient documentation

## 2016-06-28 DIAGNOSIS — D122 Benign neoplasm of ascending colon: Secondary | ICD-10-CM | POA: Diagnosis not present

## 2016-06-28 DIAGNOSIS — K635 Polyp of colon: Secondary | ICD-10-CM | POA: Diagnosis not present

## 2016-06-28 HISTORY — DX: Malignant (primary) neoplasm, unspecified: C80.1

## 2016-06-28 HISTORY — PX: COLONOSCOPY WITH PROPOFOL: SHX5780

## 2016-06-28 SURGERY — COLONOSCOPY WITH PROPOFOL
Anesthesia: General

## 2016-06-28 MED ORDER — LIDOCAINE 2% (20 MG/ML) 5 ML SYRINGE
INTRAMUSCULAR | Status: DC | PRN
  Administered 2016-06-28: 30 mg via INTRAVENOUS

## 2016-06-28 MED ORDER — FENTANYL CITRATE (PF) 100 MCG/2ML IJ SOLN
INTRAMUSCULAR | Status: DC | PRN
  Administered 2016-06-28: 50 ug via INTRAVENOUS

## 2016-06-28 MED ORDER — MIDAZOLAM HCL 2 MG/2ML IJ SOLN
INTRAMUSCULAR | Status: AC
  Filled 2016-06-28: qty 2

## 2016-06-28 MED ORDER — PROPOFOL 10 MG/ML IV BOLUS
INTRAVENOUS | Status: DC | PRN
  Administered 2016-06-28: 100 mg via INTRAVENOUS

## 2016-06-28 MED ORDER — SODIUM CHLORIDE 0.9 % IV SOLN
INTRAVENOUS | Status: DC
  Administered 2016-06-28: 1000 mL via INTRAVENOUS

## 2016-06-28 MED ORDER — LIDOCAINE HCL (PF) 2 % IJ SOLN
INTRAMUSCULAR | Status: AC
  Filled 2016-06-28: qty 2

## 2016-06-28 MED ORDER — FENTANYL CITRATE (PF) 100 MCG/2ML IJ SOLN
INTRAMUSCULAR | Status: AC
  Filled 2016-06-28: qty 2

## 2016-06-28 MED ORDER — PROPOFOL 10 MG/ML IV BOLUS
INTRAVENOUS | Status: AC
  Filled 2016-06-28: qty 40

## 2016-06-28 MED ORDER — PHENYLEPHRINE HCL 10 MG/ML IJ SOLN
INTRAMUSCULAR | Status: AC
  Filled 2016-06-28: qty 1

## 2016-06-28 MED ORDER — SODIUM CHLORIDE 0.9 % IV SOLN
INTRAVENOUS | Status: DC
  Administered 2016-06-28: 07:00:00 via INTRAVENOUS

## 2016-06-28 MED ORDER — MIDAZOLAM HCL 5 MG/5ML IJ SOLN
INTRAMUSCULAR | Status: DC | PRN
  Administered 2016-06-28: 1 mg via INTRAVENOUS

## 2016-06-28 MED ORDER — GLYCOPYRROLATE 0.2 MG/ML IJ SOLN
INTRAMUSCULAR | Status: AC
  Filled 2016-06-28: qty 1

## 2016-06-28 MED ORDER — PROPOFOL 10 MG/ML IV BOLUS
INTRAVENOUS | Status: AC
  Filled 2016-06-28: qty 20

## 2016-06-28 MED ORDER — SODIUM CHLORIDE 0.9 % IJ SOLN
INTRAMUSCULAR | Status: AC
  Filled 2016-06-28: qty 10

## 2016-06-28 MED ORDER — PROPOFOL 500 MG/50ML IV EMUL
INTRAVENOUS | Status: DC | PRN
  Administered 2016-06-28: 140 ug/kg/min via INTRAVENOUS

## 2016-06-28 MED ORDER — EPHEDRINE SULFATE 50 MG/ML IJ SOLN
INTRAMUSCULAR | Status: AC
  Filled 2016-06-28: qty 1

## 2016-06-28 MED ORDER — PROPOFOL 500 MG/50ML IV EMUL
INTRAVENOUS | Status: AC
  Filled 2016-06-28: qty 50

## 2016-06-28 NOTE — Transfer of Care (Signed)
Immediate Anesthesia Transfer of Care Note  Patient: Amanda Osborn  Procedure(s) Performed: Procedure(s): COLONOSCOPY WITH PROPOFOL (N/A)  Patient Location: PACU, Short Stay and Endoscopy Unit  Anesthesia Type:General  Level of Consciousness: sedated  Airway & Oxygen Therapy: Patient Spontanous Breathing and Patient connected to nasal cannula oxygen  Post-op Assessment: Report given to RN and Post -op Vital signs reviewed and stable  Post vital signs: Reviewed and stable  Last Vitals:  Vitals:   06/28/16 0712  BP: 106/85  Pulse: 74  Resp: 16  Temp: (!) 35.6 C    Last Pain:  Vitals:   06/28/16 0712  TempSrc: Tympanic         Complications: No apparent anesthesia complications

## 2016-06-28 NOTE — Anesthesia Post-op Follow-up Note (Cosign Needed)
Anesthesia QCDR form completed.        

## 2016-06-28 NOTE — Op Note (Signed)
[facility] Gastroenterology Patient Name: Amanda Osborn Procedure Date: 06/28/2016 7:36 AM MRN: [id number] Account #: [id number] Date of Birth: 29-Apr-1955 Admit Type: Outpatient Age: 61 Room: [facility] ENDO ROOM 3 Gender: Female Note Status: Finalized Procedure:            Colonoscopy Indications:          Screening for colorectal malignant neoplasm Providers:            Lollie Sails, MD Referring MD:         Juline Patch, MD (Referring MD) Medicines:            Monitored Anesthesia Care Complications:        No immediate complications. Procedure:            Pre-Anesthesia Assessment:                       - ASA Grade Assessment: II - A patient with mild                        systemic disease.                       After obtaining informed consent, the colonoscope was                        passed under direct vision. Throughout the procedure,                        the patient's blood pressure, pulse, and oxygen                        saturations were monitored continuously. The                        Colonoscope was introduced through the anus and                        advanced to the the cecum, identified by appendiceal                        orifice and ileocecal valve. The colonoscopy was                        unusually difficult due to significant looping and a                        tortuous colon. Successful completion of the procedure                        was aided by changing the patient to a supine position,                        changing the patient to a prone position, using manual                        pressure and withdrawing and reinserting the scope. The                        patient tolerated the procedure well. The quality of  the bowel preparation was good. Findings:      A 3 mm polyp was found in the ascending colon. The polyp was sessile.       The polyp was removed with a cold biopsy forceps. Resection  and       retrieval were complete.      The exam was otherwise without abnormality.      The digital rectal exam was normal. Impression:           - One 3 mm polyp in the ascending colon, removed with a                        cold biopsy forceps. Resected and retrieved.                       - The examination was otherwise normal. Recommendation:       - Await pathology results.                       - Telephone GI clinic for pathology results in 1 week. Procedure Code(s):    --- Professional ---                       [phone], Colonoscopy, flexible; with biopsy, single or                        multiple Diagnosis Code(s):    --- Professional ---                       Z12.11, Encounter for screening for malignant neoplasm                        of colon                       D12.2, Benign neoplasm of ascending colon CPT copyright 2016 American Medical Association. All rights reserved. The codes documented in this report are preliminary and upon coder review may  be revised to meet current compliance requirements. Lollie Sails, MD 06/28/2016 8:26:56 AM This report has been signed electronically. Number of Addenda: 0 Note Initiated On: 06/28/2016 7:36 AM Scope Withdrawal Time: 0 hours 19 minutes 1 second  Total Procedure Duration: 0 hours 39 minutes 49 seconds       [facility]

## 2016-06-28 NOTE — H&P (Signed)
Outpatient short stay form Pre-procedure 06/28/2016 7:29 AM Amanda Sails MD  Primary Physician: Dr. Laverta Baltimore  Reason for visit:  Colonoscopy  History of present illness:  Patient is a 61 year old female presenting today as above. She tolerated her prep well. She takes no aspirin or blood thinning agents. She is listed as taking a 81 mg aspirin apparently does not at the present. Her last colonoscopy was about 10 years ago.    Current Facility-Administered Medications:  .  0.9 %  sodium chloride infusion, , Intravenous, Continuous, Amanda Sails, MD .  0.9 %  sodium chloride infusion, , Intravenous, Continuous, Amanda Sails, MD  Prescriptions Prior to Admission  Medication Sig Dispense Refill Last Dose  . traMADol (ULTRAM) 50 MG tablet Take by mouth every 6 (six) hours as needed.     Marland Kitchen acyclovir (ZOVIRAX) 800 MG tablet Take 1 tablet by mouth 2 (two) times daily.   Taking  . ibandronate (BONIVA) 150 MG tablet Take 1 tablet by mouth every 30 (thirty) days.   Taking  . loratadine (CLARITIN) 10 MG tablet Take 1 tablet by mouth daily. otc   Taking  . Multiple Vitamin (MULTI-VITAMINS) TABS Take 1 tablet by mouth daily.   Taking  . polyethylene glycol powder (GLYCOLAX/MIRALAX) powder Take by mouth.   Taking  . zolpidem (AMBIEN) 5 MG tablet Take by mouth.   Taking  . [DISCONTINUED] albuterol (PROVENTIL HFA;VENTOLIN HFA) 108 (90 Base) MCG/ACT inhaler Inhale 2 puffs into the lungs every 6 (six) hours as needed for wheezing or shortness of breath. 1 Inhaler 0 Taking  . [DISCONTINUED] fluticasone (FLONASE) 50 MCG/ACT nasal spray Place 2 sprays into both nostrils daily. 16 g 6 Taking  . [DISCONTINUED] guaiFENesin-codeine (ROBITUSSIN AC) 100-10 MG/5ML syrup Take 5 mLs by mouth 3 (three) times daily as needed for cough. 150 mL 0 Taking  . [DISCONTINUED] sulfamethoxazole-trimethoprim (BACTRIM DS,SEPTRA DS) 800-160 MG tablet Take 1 tablet by mouth 2 (two) times daily. 20 tablet 0       Allergies  Allergen Reactions  . Montelukast Sodium      Past Medical History:  Diagnosis Date  . Allergy   . Cancer ([place])   . Osteoporosis     Review of systems:      Physical Exam    Heart and lungs: Regular rate and rhythm without rub or gallop, lungs are bilaterally clear.    HEENT: Normocephalic atraumatic eyes are anicteric    Other:     Pertinant exam for procedure: Soft nontender nondistended bowel sounds positive and normoactive.    Planned proceedures: Colonoscopy and indicated procedures.    Amanda Sails, MD Gastroenterology 06/28/2016  7:29 AM

## 2016-06-28 NOTE — Anesthesia Postprocedure Evaluation (Signed)
Anesthesia Post Note  Patient: Amanda Osborn  Procedure(s) Performed: Procedure(s) (LRB): COLONOSCOPY WITH PROPOFOL (N/A)  Patient location during evaluation: Endoscopy Anesthesia Type: General Level of consciousness: awake and alert Pain management: pain level controlled Vital Signs Assessment: post-procedure vital signs reviewed and stable Respiratory status: spontaneous breathing, nonlabored ventilation, respiratory function stable and patient connected to nasal cannula oxygen Cardiovascular status: blood pressure returned to baseline and stable Postop Assessment: no signs of nausea or vomiting Anesthetic complications: no     Last Vitals:  Vitals:   06/28/16 0850 06/28/16 0900  BP: 116/76 132/76  Pulse: 66 63  Resp: (!) 22 20  Temp:      Last Pain:  Vitals:   06/28/16 0830  TempSrc: Tympanic                 [name] S

## 2016-06-28 NOTE — Anesthesia Preprocedure Evaluation (Signed)
Anesthesia Evaluation  Patient identified by MRN, date of birth, ID band Patient awake    Reviewed: Allergy & Precautions, NPO status , Patient's Chart, lab work & pertinent test results, reviewed documented beta blocker date and time   Airway Mallampati: II  TM Distance: >3 FB     Dental  (+) Chipped   Pulmonary           Cardiovascular      Neuro/Psych    GI/Hepatic   Endo/Other    Renal/GU      Musculoskeletal   Abdominal   Peds  Hematology   Anesthesia Other Findings   Reproductive/Obstetrics                             Anesthesia Physical Anesthesia Plan  ASA: III  Anesthesia Plan: General   Post-op Pain Management:    Induction: Intravenous  Airway Management Planned:   Additional Equipment:   Intra-op Plan:   Post-operative Plan:   Informed Consent: I have reviewed the patients History and Physical, chart, labs and discussed the procedure including the risks, benefits and alternatives for the proposed anesthesia with the patient or authorized representative who has indicated his/her understanding and acceptance.     Plan Discussed with: CRNA  Anesthesia Plan Comments:         Anesthesia Quick Evaluation

## 2016-06-29 ENCOUNTER — Encounter: Payer: Self-pay | Admitting: Gastroenterology

## 2016-06-30 LAB — SURGICAL PATHOLOGY

## 2016-07-14 ENCOUNTER — Ambulatory Visit (INDEPENDENT_AMBULATORY_CARE_PROVIDER_SITE_OTHER): Payer: BLUE CROSS/BLUE SHIELD | Admitting: Family Medicine

## 2016-07-14 VITALS — BP 120/80 | HR 80 | Ht 63.0 in | Wt 102.0 lb

## 2016-07-14 DIAGNOSIS — J301 Allergic rhinitis due to pollen: Secondary | ICD-10-CM | POA: Diagnosis not present

## 2016-07-14 DIAGNOSIS — J0100 Acute maxillary sinusitis, unspecified: Secondary | ICD-10-CM

## 2016-07-14 DIAGNOSIS — J40 Bronchitis, not specified as acute or chronic: Secondary | ICD-10-CM | POA: Diagnosis not present

## 2016-07-14 DIAGNOSIS — M5090 Cervical disc disorder, unspecified, unspecified cervical region: Secondary | ICD-10-CM | POA: Diagnosis not present

## 2016-07-14 MED ORDER — DOXYCYCLINE HYCLATE 100 MG PO TABS: 100.0000 mg | ORAL_TABLET | Freq: Two times a day (BID) | ORAL | 1 refills | Status: DC

## 2016-07-14 MED ORDER — TRAMADOL HCL 50 MG PO TABS: 50.0000 mg | ORAL_TABLET | Freq: Four times a day (QID) | ORAL | 0 refills | Status: DC | PRN

## 2016-07-14 MED ORDER — GUAIFENESIN-CODEINE 100-10 MG/5ML PO SYRP: 5.0000 mL | ORAL_SOLUTION | Freq: Three times a day (TID) | ORAL | 0 refills | Status: DC | PRN

## 2016-07-14 NOTE — Progress Notes (Signed)
Name: Amanda Osborn   MRN: [id number]    DOB: 1956/01/18   Date:07/14/2016       Progress Note  Subjective  Chief Complaint  Chief Complaint  Patient presents with  . Sore Throat    otc sudafed and xyzal- loosened everything up but throat is still "killing me"    Sore Throat   This is a new problem. The current episode started in the past 7 days. The problem has been gradually worsening. The pain is worse on the right side. There has been no fever. The pain is mild. Associated symptoms include congestion, coughing and a hoarse voice. Pertinent negatives include no abdominal pain, diarrhea, drooling, ear discharge, ear pain, headaches, neck pain, shortness of breath, stridor, swollen glands or trouble swallowing. She has had no exposure to strep or mono. The treatment provided mild relief.  Sinusitis  This is a new problem. The current episode started in the past 7 days. There has been no fever. Associated symptoms include congestion, coughing and a hoarse voice. Pertinent negatives include no chills, ear pain, headaches, neck pain, shortness of breath, sore throat or swollen glands. Past treatments include nothing. The treatment provided mild relief.    No problem-specific Assessment & Plan notes found for this encounter.   Past Medical History:  Diagnosis Date  . Allergy   . Cancer ([place])   . Osteoporosis     Past Surgical History:  Procedure Laterality Date  . AUGMENTATION MAMMAPLASTY Bilateral 2011  . BREAST CYST ASPIRATION Bilateral   . BREAST EXCISIONAL BIOPSY Left 2006   breast ca  . BREAST LUMPECTOMY    . CARDIAC CATHETERIZATION    . COLONOSCOPY    . COLONOSCOPY WITH PROPOFOL N/A 06/28/2016   Procedure: COLONOSCOPY WITH PROPOFOL;  Surgeon: Lollie Sails, MD;  Location: [facility] ENDOSCOPY;  Service: Endoscopy;  Laterality: N/A;  . MASTECTOMY Bilateral   . VAGINAL HYSTERECTOMY      Family History  Problem Relation Age of Onset  . Breast cancer Paternal Aunt 73  .  Breast cancer Paternal Grandmother 54    Social History   Social History  . Marital status: Married    Spouse name: N/A  . Number of children: N/A  . Years of education: N/A   Occupational History  . Not on file.   Social History Main Topics  . Smoking status: Never Smoker  . Smokeless tobacco: Never Used  . Alcohol use No  . Drug use: No  . Sexual activity: Yes   Other Topics Concern  . Not on file   Social History Narrative  . No narrative on file    Allergies  Allergen Reactions  . Montelukast Sodium     Outpatient Medications Prior to Visit  Medication Sig Dispense Refill  . acyclovir (ZOVIRAX) 800 MG tablet Take 1 tablet by mouth 2 (two) times daily.    Marland Kitchen ibandronate (BONIVA) 150 MG tablet Take 1 tablet by mouth every 30 (thirty) days.    Marland Kitchen loratadine (CLARITIN) 10 MG tablet Take 1 tablet by mouth daily. otc    . Multiple Vitamin (MULTI-VITAMINS) TABS Take 1 tablet by mouth daily.    Marland Kitchen zolpidem (AMBIEN) 5 MG tablet Take by mouth. Dr Ouida Sills    . traMADol (ULTRAM) 50 MG tablet Take by mouth every 6 (six) hours as needed.    . polyethylene glycol powder (GLYCOLAX/MIRALAX) powder Take by mouth.     No facility-administered medications prior to visit.     Review of  Systems  Constitutional: Negative for chills, fever, malaise/fatigue and weight loss.  HENT: Positive for congestion, hoarse voice and sinus pain. Negative for drooling, ear discharge, ear pain, sore throat and trouble swallowing.   Eyes: Negative for blurred vision.  Respiratory: Positive for cough. Negative for sputum production, shortness of breath, wheezing and stridor.   Cardiovascular: Negative for chest pain, palpitations and leg swelling.  Gastrointestinal: Negative for abdominal pain, blood in stool, constipation, diarrhea, heartburn, melena and nausea.  Genitourinary: Negative for dysuria, frequency, hematuria and urgency.  Musculoskeletal: Negative for back pain, joint pain, myalgias  and neck pain.  Skin: Negative for itching and rash.  Neurological: Negative for dizziness, tingling, sensory change, focal weakness and headaches.  Endo/Heme/Allergies: Negative for environmental allergies and polydipsia. Does not bruise/bleed easily.  Psychiatric/Behavioral: Negative for depression and suicidal ideas. The patient is not nervous/anxious and does not have insomnia.      Objective  Vitals:   07/14/16 0814  BP: 120/80  Pulse: 80  Weight: 102 lb (46.3 kg)  Height: 5\' 3"  (1.6 m)    Physical Exam  Constitutional: She is well-developed, well-nourished, and in no distress. No distress.  HENT:  Head: Normocephalic and atraumatic.  Right Ear: External ear normal.  Left Ear: External ear normal.  Nose: Nose normal.  Mouth/Throat: Oropharynx is clear and moist.  Eyes: Conjunctivae and EOM are normal. Pupils are equal, round, and reactive to light. Right eye exhibits no discharge. Left eye exhibits no discharge.  Neck: Normal range of motion. Neck supple. No JVD present. No thyromegaly present.  Cardiovascular: Normal rate, regular rhythm, normal heart sounds and intact distal pulses.  Exam reveals no gallop and no friction rub.   No murmur heard. Pulmonary/Chest: Effort normal and breath sounds normal. She has no wheezes. She has no rales.  Abdominal: Soft. Bowel sounds are normal. She exhibits no mass. There is no tenderness. There is no guarding.  Musculoskeletal: Normal range of motion. She exhibits no edema.       Cervical back: She exhibits spasm. She exhibits normal range of motion, no tenderness and no bony tenderness.  Lymphadenopathy:    She has no cervical adenopathy.  Neurological: She is alert. She has normal reflexes.  Skin: Skin is warm and dry. She is not diaphoretic.  Psychiatric: Mood and affect normal.  Nursing note and vitals reviewed.     Assessment & Plan  Problem List Items Addressed This Visit      Respiratory   Allergic rhinitis      Musculoskeletal and Integument   Cervical disc disease   Relevant Medications   traMADol (ULTRAM) 50 MG tablet    Other Visit Diagnoses    Acute maxillary sinusitis, recurrence not specified    -  Primary   Relevant Medications   doxycycline (VIBRA-TABS) 100 MG tablet   guaiFENesin-codeine (ROBITUSSIN AC) 100-10 MG/5ML syrup   Bronchitis       Relevant Medications   doxycycline (VIBRA-TABS) 100 MG tablet   guaiFENesin-codeine (ROBITUSSIN AC) 100-10 MG/5ML syrup      Meds ordered this encounter  Medications  . doxycycline (VIBRA-TABS) 100 MG tablet    Sig: Take 1 tablet (100 mg total) by mouth 2 (two) times daily.    Dispense:  20 tablet    Refill:  1  . guaiFENesin-codeine (ROBITUSSIN AC) 100-10 MG/5ML syrup    Sig: Take 5 mLs by mouth 3 (three) times daily as needed for cough.    Dispense:  150 mL  Refill:  0  . traMADol (ULTRAM) 50 MG tablet    Sig: Take 1 tablet (50 mg total) by mouth every 6 (six) hours as needed.    Dispense:  30 tablet    Refill:  0      Dr. Deanna Jones [facility]  07/14/16

## 2016-08-13 DIAGNOSIS — L281 Prurigo nodularis: Secondary | ICD-10-CM | POA: Diagnosis not present

## 2017-01-07 DIAGNOSIS — G8929 Other chronic pain: Secondary | ICD-10-CM | POA: Diagnosis not present

## 2017-01-07 DIAGNOSIS — M4316 Spondylolisthesis, lumbar region: Secondary | ICD-10-CM | POA: Diagnosis not present

## 2017-01-07 DIAGNOSIS — M65341 Trigger finger, right ring finger: Secondary | ICD-10-CM | POA: Diagnosis not present

## 2017-01-07 DIAGNOSIS — M545 Low back pain: Secondary | ICD-10-CM | POA: Diagnosis not present

## 2017-01-17 ENCOUNTER — Other Ambulatory Visit: Payer: Self-pay

## 2017-01-17 ENCOUNTER — Telehealth: Payer: Self-pay

## 2017-01-17 NOTE — Telephone Encounter (Signed)
Called [place] to see if she can be seen for Dizziness, fatigue, and all together not well. She said her balance is off. She also reports having MRI and other testing done about a year ago for this and given anti depression meds to assist her and she is worse now.   I asked if she is having chest pains and she reports yes on R side Saturday but not today. She also reports increased stress in having a sick parent. I talked to Baxter Flattery who reviewed notes in chart that said she saw Manuella Ghazi in Neuro in January for this and after reading MRI results Baxter Flattery feels it best to send her back to Manuella Ghazi to monitor any changes and compare to last issues.

## 2017-01-24 DIAGNOSIS — R079 Chest pain, unspecified: Secondary | ICD-10-CM | POA: Diagnosis not present

## 2017-01-24 DIAGNOSIS — F5104 Psychophysiologic insomnia: Secondary | ICD-10-CM | POA: Diagnosis not present

## 2017-01-24 DIAGNOSIS — G43109 Migraine with aura, not intractable, without status migrainosus: Secondary | ICD-10-CM | POA: Diagnosis not present

## 2017-01-25 DIAGNOSIS — Z853 Personal history of malignant neoplasm of breast: Secondary | ICD-10-CM | POA: Diagnosis not present

## 2017-01-25 DIAGNOSIS — Z1211 Encounter for screening for malignant neoplasm of colon: Secondary | ICD-10-CM | POA: Diagnosis not present

## 2017-01-25 DIAGNOSIS — R5383 Other fatigue: Secondary | ICD-10-CM | POA: Diagnosis not present

## 2017-01-25 DIAGNOSIS — R42 Dizziness and giddiness: Secondary | ICD-10-CM | POA: Diagnosis not present

## 2017-01-25 DIAGNOSIS — I208 Other forms of angina pectoris: Secondary | ICD-10-CM | POA: Diagnosis not present

## 2017-01-25 DIAGNOSIS — R0602 Shortness of breath: Secondary | ICD-10-CM | POA: Diagnosis not present

## 2017-01-25 DIAGNOSIS — F419 Anxiety disorder, unspecified: Secondary | ICD-10-CM | POA: Diagnosis not present

## 2017-01-25 DIAGNOSIS — Z01419 Encounter for gynecological examination (general) (routine) without abnormal findings: Secondary | ICD-10-CM | POA: Diagnosis not present

## 2017-01-31 DIAGNOSIS — R899 Unspecified abnormal finding in specimens from other organs, systems and tissues: Secondary | ICD-10-CM | POA: Diagnosis not present

## 2017-02-03 DIAGNOSIS — F411 Generalized anxiety disorder: Secondary | ICD-10-CM | POA: Diagnosis not present

## 2017-02-03 DIAGNOSIS — F5105 Insomnia due to other mental disorder: Secondary | ICD-10-CM | POA: Diagnosis not present

## 2017-02-09 ENCOUNTER — Other Ambulatory Visit: Payer: Self-pay | Admitting: Family Medicine

## 2017-02-09 DIAGNOSIS — J0100 Acute maxillary sinusitis, unspecified: Secondary | ICD-10-CM

## 2017-02-09 DIAGNOSIS — J40 Bronchitis, not specified as acute or chronic: Secondary | ICD-10-CM

## 2017-02-14 ENCOUNTER — Encounter: Payer: Self-pay | Admitting: Family Medicine

## 2017-02-14 ENCOUNTER — Ambulatory Visit: Payer: BLUE CROSS/BLUE SHIELD | Admitting: Family Medicine

## 2017-02-14 VITALS — BP 100/60 | HR 64 | Ht 63.0 in | Wt 103.0 lb

## 2017-02-14 DIAGNOSIS — F4323 Adjustment disorder with mixed anxiety and depressed mood: Secondary | ICD-10-CM

## 2017-02-14 DIAGNOSIS — R5383 Other fatigue: Secondary | ICD-10-CM | POA: Diagnosis not present

## 2017-02-14 DIAGNOSIS — C801 Malignant (primary) neoplasm, unspecified: Secondary | ICD-10-CM

## 2017-02-14 DIAGNOSIS — R5381 Other malaise: Secondary | ICD-10-CM | POA: Diagnosis not present

## 2017-02-14 DIAGNOSIS — R7989 Other specified abnormal findings of blood chemistry: Secondary | ICD-10-CM

## 2017-02-14 NOTE — Progress Notes (Signed)
Name: Amanda Osborn   MRN: [id number]    DOB: Apr 25, 1955   Date:02/14/2017       Progress Note  Subjective  Chief Complaint  Chief Complaint  Patient presents with  . Follow-up    discuss thyroid labs as directed by [facility]  . sneezing    allergies- takes claritin as needed but not every day    Thyroid Problem  Presents for initial visit. Symptoms include anxiety, depressed mood and fatigue. Patient reports no cold intolerance, constipation, diaphoresis, diarrhea, dry skin, hair loss, heat intolerance, hoarse voice, leg swelling, nail problem, palpitations, tremors, visual change, weight gain or weight loss. The symptoms have been stable. Treatments tried: citalopram. The treatment provided mild relief. Her past medical history is significant for hyperlipidemia.    No problem-specific Assessment & Plan notes found for this encounter.   Past Medical History:  Diagnosis Date  . Allergy   . Cancer ([place])   . Osteoporosis     Past Surgical History:  Procedure Laterality Date  . AUGMENTATION MAMMAPLASTY Bilateral 2011  . BREAST CYST ASPIRATION Bilateral   . BREAST EXCISIONAL BIOPSY Left 2006   breast ca  . BREAST LUMPECTOMY    . CARDIAC CATHETERIZATION    . COLONOSCOPY    . MASTECTOMY Bilateral   . VAGINAL HYSTERECTOMY      Family History  Problem Relation Age of Onset  . Breast cancer Paternal Aunt 19  . Breast cancer Paternal Grandmother 71    Social History   Socioeconomic History  . Marital status: Married    Spouse name: Not on file  . Number of children: Not on file  . Years of education: Not on file  . Highest education level: Not on file  Social Needs  . Financial resource strain: Not on file  . Food insecurity - worry: Not on file  . Food insecurity - inability: Not on file  . Transportation needs - medical: Not on file  . Transportation needs - non-medical: Not on file  Occupational History  . Not on file  Tobacco Use  . Smoking status: Never  Smoker  . Smokeless tobacco: Never Used  Substance and Sexual Activity  . Alcohol use: No    Alcohol/week: 0.0 oz  . Drug use: No  . Sexual activity: Yes  Other Topics Concern  . Not on file  Social History Narrative  . Not on file    Allergies  Allergen Reactions  . Montelukast Sodium     Outpatient Medications Prior to Visit  Medication Sig Dispense Refill  . acyclovir (ZOVIRAX) 800 MG tablet Take 1 tablet by mouth 2 (two) times daily.    Marland Kitchen ibandronate (BONIVA) 150 MG tablet Take 1 tablet by mouth every 30 (thirty) days.    Marland Kitchen loratadine (CLARITIN) 10 MG tablet Take 1 tablet by mouth daily. otc    . Multiple Vitamin (MULTI-VITAMINS) TABS Take 1 tablet by mouth daily.    Marland Kitchen aspirin EC 81 MG tablet Take 1 tablet daily by mouth.    . meloxicam (MOBIC) 7.5 MG tablet Take 7.5 mg daily by mouth. Reche Dixon  1  . traMADol (ULTRAM) 50 MG tablet Take 1 tablet (50 mg total) by mouth every 6 (six) hours as needed. (Patient not taking: Reported on 02/14/2017) 30 tablet 0  . traZODone (DESYREL) 50 MG tablet Take 2 tablets at bedtime by mouth. Dr Nicolasa Ducking  0  . doxycycline (VIBRA-TABS) 100 MG tablet Take 1 tablet (100 mg total) by mouth 2 (  two) times daily. 20 tablet 1  . guaiFENesin-codeine (ROBITUSSIN AC) 100-10 MG/5ML syrup Take 5 mLs by mouth 3 (three) times daily as needed for cough. 150 mL 0  . polyethylene glycol powder (GLYCOLAX/MIRALAX) powder Take by mouth.    . zolpidem (AMBIEN) 5 MG tablet Take by mouth. Dr Ouida Sills     No facility-administered medications prior to visit.     Review of Systems  Constitutional: Positive for fatigue. Negative for chills, diaphoresis, fever, malaise/fatigue, weight gain and weight loss.  HENT: Negative for ear discharge, ear pain, hoarse voice and sore throat.   Eyes: Negative for blurred vision.  Respiratory: Negative for cough, sputum production, shortness of breath and wheezing.   Cardiovascular: Negative for chest pain, palpitations and leg  swelling.  Gastrointestinal: Negative for abdominal pain, blood in stool, constipation, diarrhea, heartburn, melena and nausea.  Genitourinary: Negative for dysuria, frequency, hematuria and urgency.  Musculoskeletal: Negative for back pain, joint pain, myalgias and neck pain.  Skin: Negative for rash.  Neurological: Negative for dizziness, tingling, tremors, sensory change, focal weakness and headaches.  Endo/Heme/Allergies: Negative for environmental allergies, cold intolerance, heat intolerance and polydipsia. Does not bruise/bleed easily.  Psychiatric/Behavioral: Negative for depression and suicidal ideas. The patient is nervous/anxious. The patient does not have insomnia.      Objective  Vitals:   02/14/17 0839  BP: 100/60  Pulse: 64  Weight: 103 lb (46.7 kg)  Height: 5\' 3"  (1.6 m)    Physical Exam  Constitutional: She is well-developed, well-nourished, and in no distress. No distress.  HENT:  Head: Normocephalic and atraumatic.  Right Ear: External ear normal.  Left Ear: External ear normal.  Nose: Nose normal.  Mouth/Throat: Oropharynx is clear and moist.  Eyes: Conjunctivae and EOM are normal. Pupils are equal, round, and reactive to light. Right eye exhibits no discharge. Left eye exhibits no discharge.  Neck: Normal range of motion. Neck supple. No JVD present. No thyromegaly present.  Cardiovascular: Normal rate, regular rhythm, normal heart sounds and intact distal pulses. Exam reveals no gallop and no friction rub.  No murmur heard. Pulmonary/Chest: Effort normal and breath sounds normal. She has no wheezes. She has no rales.  Abdominal: Soft. Bowel sounds are normal. She exhibits no mass. There is no tenderness. There is no guarding.  Musculoskeletal: Normal range of motion. She exhibits no edema.  Lymphadenopathy:    She has no cervical adenopathy.  Neurological: She is alert. She has normal reflexes.  Skin: Skin is warm and dry. She is not diaphoretic.   Psychiatric: Mood and affect normal.  Nursing note and vitals reviewed.     Assessment & Plan  Problem List Items Addressed This Visit      Other   Malignant neoplastic disease ([place])   Relevant Medications   aspirin EC 81 MG tablet    Other Visit Diagnoses    Malaise and fatigue    -  Primary   Relevant Orders   Ambulatory referral to Endocrinology   Adjustment reaction with anxiety and depression       recheck with Dr Nicolasa Ducking   Abnormal thyroid blood test       Relevant Orders   Ambulatory referral to Endocrinology      No orders of the defined types were placed in this encounter.     Dr. [facility]  02/14/17

## 2017-02-14 NOTE — Patient Instructions (Signed)
Venlafaxine tablets What is this medicine? VENLAFAXINE (VEN la fax een) is used to treat depression, anxiety and panic disorder. This medicine may be used for other purposes; ask your health care provider or pharmacist if you have questions. COMMON BRAND NAME(S): Effexor What should I tell my health care provider before I take this medicine? They need to know if you have any of these conditions: -bleeding disorders -glaucoma -heart disease -high blood pressure -high cholesterol -kidney disease -liver disease -low levels of sodium in the blood -mania or bipolar disorder -seizures -suicidal thoughts, plans, or attempt; a previous suicide attempt by you or a family -take medicines that treat or prevent blood clots -thyroid disease -an unusual or allergic reaction to venlafaxine, desvenlafaxine, other medicines, foods, dyes, or preservatives -pregnant or trying to get pregnant -breast-feeding How should I use this medicine? Take this medicine by mouth with a glass of water. Follow the directions on the prescription label. Take it with food. Take your medicine at regular intervals. Do not take your medicine more often than directed. Do not stop taking this medicine suddenly except upon the advice of your doctor. Stopping this medicine too quickly may cause serious side effects or your condition may worsen. A special MedGuide will be given to you by the pharmacist with each prescription and refill. Be sure to read this information carefully each time. Talk to your pediatrician regarding the use of this medicine in children. Special care may be needed. Overdosage: If you think you have taken too much of this medicine contact a poison control center or emergency room at once. NOTE: This medicine is only for you. Do not share this medicine with others. What if I miss a dose? If you miss a dose, take it as soon as you can. If it is almost time for your next dose, take only that dose. Do not take  double or extra doses. What may interact with this medicine? Do not take this medicine with any of the following medications: -certain medicines for fungal infections like fluconazole, itraconazole, ketoconazole, posaconazole, voriconazole -cisapride -desvenlafaxine -dofetilide -dronedarone -duloxetine -levomilnacipran -linezolid -MAOIs like Carbex, Eldepryl, Marplan, Nardil, and Parnate -methylene blue (injected into a vein) -milnacipran -pimozide -thioridazine -ziprasidone This medicine may also interact with the following medications: -amphetamines -aspirin and aspirin-like medicines -certain medicines for depression, anxiety, or psychotic disturbances -certain medicines for migraine headaches like almotriptan, eletriptan, frovatriptan, naratriptan, rizatriptan, sumatriptan, zolmitriptan -certain medicines for sleep -certain medicines that treat or prevent blood clots like dalteparin, enoxaparin, warfarin -cimetidine -clozapine -diuretics -fentanyl -furazolidone -indinavir -isoniazid -lithium -metoprolol -NSAIDS, medicines for pain and inflammation, like ibuprofen or naproxen -other medicines that prolong the QT interval (cause an abnormal heart rhythm) -procarbazine -rasagiline -supplements like St. John's wort, kava kava, valerian -tramadol -tryptophan This list may not describe all possible interactions. Give your health care provider a list of all the medicines, herbs, non-prescription drugs, or dietary supplements you use. Also tell them if you smoke, drink alcohol, or use illegal drugs. Some items may interact with your medicine. What should I watch for while using this medicine? Tell your doctor if your symptoms do not get better or if they get worse. Visit your doctor or health care professional for regular checks on your progress. Because it may take several weeks to see the full effects of this medicine, it is important to continue your treatment as prescribed  by your doctor. Patients and their families should watch out for new or worsening thoughts of suicide or depression. Also   watch out for sudden changes in feelings such as feeling anxious, agitated, panicky, irritable, hostile, aggressive, impulsive, severely restless, overly excited and hyperactive, or not being able to sleep. If this happens, especially at the beginning of treatment or after a change in dose, call your health care professional. This medicine can cause an increase in blood pressure. Check with your doctor for instructions on monitoring your blood pressure while taking this medicine. You may get drowsy or dizzy. Do not drive, use machinery, or do anything that needs mental alertness until you know how this medicine affects you. Do not stand or sit up quickly, especially if you are an older patient. This reduces the risk of dizzy or fainting spells. Alcohol may interfere with the effect of this medicine. Avoid alcoholic drinks. Your mouth may get dry. Chewing sugarless gum, sucking hard candy and drinking plenty of water will help. Contact your doctor if the problem does not go away or is severe. What side effects may I notice from receiving this medicine? Side effects that you should report to your doctor or health care professional as soon as possible: -allergic reactions like skin rash, itching or hives, swelling of the face, lips, or tongue -anxious -breathing problems -confusion -changes in vision -chest pain -confusion -elevated mood, decreased need for sleep, racing thoughts, impulsive behavior -eye pain -fast, irregular heartbeat -feeling faint or lightheaded, falls -feeling agitated, angry, or irritable -hallucination, loss of contact with reality -high blood pressure -loss of balance or coordination -palpitations -redness, blistering, peeling or loosening of the skin, including inside the mouth -restlessness, pacing, inability to keep still -seizures -stiff  muscles -suicidal thoughts or other mood changes -trouble passing urine or change in the amount of urine -trouble sleeping -unusual bleeding or bruising -unusually weak or tired -vomiting Side effects that usually do not require medical attention (report to your doctor or health care professional if they continue or are bothersome): -change in sex drive or performance -change in appetite or weight -constipation -dizziness -dry mouth -headache -increased sweating -nausea -tired This list may not describe all possible side effects. Call your doctor for medical advice about side effects. You may report side effects to FDA at 1-800-FDA-1088. Where should I keep my medicine? Keep out of the reach of children. Store at a controlled temperature between 20 and 25 degrees C (68 and 77 degrees F), in a dry place. Throw away any unused medicine after the expiration date. NOTE: This sheet is a summary. It may not cover all possible information. If you have questions about this medicine, talk to your doctor, pharmacist, or health care provider.  2018 Elsevier/Gold Standard (2015-08-28 18:42:26)  

## 2017-02-18 ENCOUNTER — Ambulatory Visit: Payer: BLUE CROSS/BLUE SHIELD | Admitting: Family Medicine

## 2017-02-18 ENCOUNTER — Encounter: Payer: Self-pay | Admitting: Family Medicine

## 2017-02-18 VITALS — BP 120/80 | HR 60 | Ht 63.0 in | Wt 108.0 lb

## 2017-02-18 DIAGNOSIS — J029 Acute pharyngitis, unspecified: Secondary | ICD-10-CM

## 2017-02-18 DIAGNOSIS — J40 Bronchitis, not specified as acute or chronic: Secondary | ICD-10-CM

## 2017-02-18 DIAGNOSIS — B379 Candidiasis, unspecified: Secondary | ICD-10-CM

## 2017-02-18 LAB — POCT RAPID STREP A (OFFICE): Rapid Strep A Screen: NEGATIVE

## 2017-02-18 MED ORDER — AMOXICILLIN 500 MG PO CAPS: 500.0000 mg | ORAL_CAPSULE | Freq: Three times a day (TID) | ORAL | 0 refills | Status: DC

## 2017-02-18 MED ORDER — FLUCONAZOLE 150 MG PO TABS: 150.0000 mg | ORAL_TABLET | Freq: Once | ORAL | 0 refills | Status: AC

## 2017-02-18 MED ORDER — GUAIFENESIN-CODEINE 100-10 MG/5ML PO SYRP: 5.0000 mL | ORAL_SOLUTION | Freq: Three times a day (TID) | ORAL | 0 refills | Status: DC | PRN

## 2017-02-18 MED ORDER — NYSTATIN 100000 UNIT/ML MT SUSP: 5.0000 mL | Freq: Four times a day (QID) | OROMUCOSAL | 0 refills | Status: DC

## 2017-02-18 MED ORDER — ACYCLOVIR 800 MG PO TABS: 800.0000 mg | ORAL_TABLET | Freq: Two times a day (BID) | ORAL | 1 refills | Status: DC

## 2017-02-18 NOTE — Progress Notes (Signed)
Name: Amanda Osborn   MRN: [id number]    DOB: 28-Jan-1956   Date:02/18/2017       Progress Note  Subjective  Chief Complaint  Chief Complaint  Patient presents with  . Sore Throat    and cough- little yellow production    Sore Throat   This is a new problem. The current episode started in the past 7 days. The problem has been gradually worsening. The pain is worse on the left side. There has been no fever. The pain is moderate. Associated symptoms include congestion, coughing, headaches and trouble swallowing. Pertinent negatives include no abdominal pain, diarrhea, ear discharge, ear pain, neck pain or shortness of breath. She has had no exposure to strep or mono. She has tried nothing for the symptoms. The treatment provided no relief.  Cough  This is a new problem. The current episode started in the past 7 days. The problem has been gradually worsening. The cough is productive of purulent sputum (yellow/green). Associated symptoms include headaches. Pertinent negatives include no chest pain, chills, ear congestion, ear pain, fever, heartburn, myalgias, nasal congestion, postnasal drip, rash, sore throat, shortness of breath, weight loss or wheezing. She has tried OTC inhaler for the symptoms. There is no history of environmental allergies.    No problem-specific Assessment & Plan notes found for this encounter.   Past Medical History:  Diagnosis Date  . Allergy   . Cancer ([place])   . Osteoporosis     Past Surgical History:  Procedure Laterality Date  . AUGMENTATION MAMMAPLASTY Bilateral 2011  . BREAST CYST ASPIRATION Bilateral   . BREAST EXCISIONAL BIOPSY Left 2006   breast ca  . BREAST LUMPECTOMY    . CARDIAC CATHETERIZATION    . COLONOSCOPY    . MASTECTOMY Bilateral   . VAGINAL HYSTERECTOMY      Family History  Problem Relation Age of Onset  . Breast cancer Paternal Aunt 40  . Breast cancer Paternal Grandmother 44    Social History   Socioeconomic History  .  Marital status: Married    Spouse name: Not on file  . Number of children: Not on file  . Years of education: Not on file  . Highest education level: Not on file  Social Needs  . Financial resource strain: Not on file  . Food insecurity - worry: Not on file  . Food insecurity - inability: Not on file  . Transportation needs - medical: Not on file  . Transportation needs - non-medical: Not on file  Occupational History  . Not on file  Tobacco Use  . Smoking status: Never Smoker  . Smokeless tobacco: Never Used  Substance and Sexual Activity  . Alcohol use: No    Alcohol/week: 0.0 oz  . Drug use: No  . Sexual activity: Yes  Other Topics Concern  . Not on file  Social History Narrative  . Not on file    Allergies  Allergen Reactions  . Montelukast Sodium     Outpatient Medications Prior to Visit  Medication Sig Dispense Refill  . acyclovir (ZOVIRAX) 800 MG tablet Take 1 tablet by mouth 2 (two) times daily.    Marland Kitchen aspirin EC 81 MG tablet Take 1 tablet daily by mouth.    . ibandronate (BONIVA) 150 MG tablet Take 1 tablet by mouth every 30 (thirty) days.    Marland Kitchen loratadine (CLARITIN) 10 MG tablet Take 1 tablet by mouth daily. otc    . meloxicam (MOBIC) 7.5 MG tablet Take 7.5  mg daily by mouth. Reche Dixon  1  . Multiple Vitamin (MULTI-VITAMINS) TABS Take 1 tablet by mouth daily.    . traZODone (DESYREL) 50 MG tablet Take 2 tablets at bedtime by mouth. Dr Nicolasa Ducking  0  . traMADol (ULTRAM) 50 MG tablet Take 1 tablet (50 mg total) by mouth every 6 (six) hours as needed. (Patient not taking: Reported on 02/14/2017) 30 tablet 0   No facility-administered medications prior to visit.     Review of Systems  Constitutional: Negative for chills, fever, malaise/fatigue and weight loss.  HENT: Positive for congestion and trouble swallowing. Negative for ear discharge, ear pain, postnasal drip and sore throat.   Eyes: Negative for blurred vision.  Respiratory: Positive for cough. Negative for  sputum production, shortness of breath and wheezing.   Cardiovascular: Negative for chest pain, palpitations and leg swelling.  Gastrointestinal: Negative for abdominal pain, blood in stool, constipation, diarrhea, heartburn, melena and nausea.  Genitourinary: Negative for dysuria, frequency, hematuria and urgency.  Musculoskeletal: Negative for back pain, joint pain, myalgias and neck pain.  Skin: Negative for rash.  Neurological: Positive for headaches. Negative for dizziness, tingling, sensory change and focal weakness.  Endo/Heme/Allergies: Negative for environmental allergies and polydipsia. Does not bruise/bleed easily.  Psychiatric/Behavioral: Negative for depression and suicidal ideas. The patient is not nervous/anxious and does not have insomnia.      Objective  Vitals:   02/18/17 0933  BP: 120/80  Pulse: 60  Weight: 108 lb (49 kg)  Height: 5\' 3"  (1.6 m)    Physical Exam  Constitutional: She is well-developed, well-nourished, and in no distress. No distress.  HENT:  Head: Normocephalic and atraumatic.  Right Ear: Tympanic membrane, external ear and ear canal normal.  Left Ear: Tympanic membrane, external ear and ear canal normal.  Nose: Nose normal.  Mouth/Throat: Uvula is midline. Oropharyngeal exudate and posterior oropharyngeal erythema present. No posterior oropharyngeal edema.  leukoplakia/palate  Eyes: Conjunctivae and EOM are normal. Pupils are equal, round, and reactive to light. Right eye exhibits no discharge. Left eye exhibits no discharge.  Neck: Normal range of motion. Neck supple. No JVD present. No thyromegaly present.  Cardiovascular: Normal rate, regular rhythm, normal heart sounds and intact distal pulses. Exam reveals no gallop and no friction rub.  No murmur heard. Pulmonary/Chest: Effort normal and breath sounds normal. She has no wheezes. She has no rales.  Abdominal: Soft. Bowel sounds are normal. She exhibits no mass. There is no tenderness. There  is no guarding.  Musculoskeletal: Normal range of motion. She exhibits no edema.  Lymphadenopathy:    She has no cervical adenopathy.  Neurological: She is alert. She has normal reflexes.  Skin: Skin is warm and dry. She is not diaphoretic.  Psychiatric: Mood and affect normal.  Nursing note and vitals reviewed.     Assessment & Plan  Problem List Items Addressed This Visit    None    Visit Diagnoses    Pharyngitis, unspecified etiology    -  Primary   Relevant Medications   acyclovir (ZOVIRAX) 800 MG tablet   nystatin (MYCOSTATIN) 100000 UNIT/ML suspension   Other Relevant Orders   POCT rapid strep A (Completed)   Bronchitis       Relevant Medications   amoxicillin (AMOXIL) 500 MG capsule   guaiFENesin-codeine (ROBITUSSIN AC) 100-10 MG/5ML syrup   Candidiasis       Relevant Medications   acyclovir (ZOVIRAX) 800 MG tablet   nystatin (MYCOSTATIN) 100000 UNIT/ML suspension   fluconazole (  DIFLUCAN) 150 MG tablet      Meds ordered this encounter  Medications  . amoxicillin (AMOXIL) 500 MG capsule    Sig: Take 1 capsule (500 mg total) 3 (three) times daily by mouth.    Dispense:  30 capsule    Refill:  0  . acyclovir (ZOVIRAX) 800 MG tablet    Sig: Take 1 tablet (800 mg total) 2 (two) times daily by mouth.    Dispense:  20 tablet    Refill:  1  . nystatin (MYCOSTATIN) 100000 UNIT/ML suspension    Sig: Take 5 mLs (500,000 Units total) 4 (four) times daily by mouth.    Dispense:  60 mL    Refill:  0  . guaiFENesin-codeine (ROBITUSSIN AC) 100-10 MG/5ML syrup    Sig: Take 5 mLs 3 (three) times daily as needed by mouth for cough.    Dispense:  150 mL    Refill:  0  . fluconazole (DIFLUCAN) 150 MG tablet    Sig: Take 1 tablet (150 mg total) once for 1 dose by mouth.    Dispense:  1 tablet    Refill:  0      Dr. Otilio Miu [facility]  02/18/17

## 2017-02-21 ENCOUNTER — Other Ambulatory Visit: Payer: Self-pay | Admitting: Family Medicine

## 2017-02-21 DIAGNOSIS — J029 Acute pharyngitis, unspecified: Secondary | ICD-10-CM

## 2017-02-28 ENCOUNTER — Other Ambulatory Visit: Payer: Self-pay

## 2017-03-01 ENCOUNTER — Ambulatory Visit: Payer: BLUE CROSS/BLUE SHIELD | Admitting: Family Medicine

## 2017-03-01 ENCOUNTER — Ambulatory Visit
Admission: RE | Admit: 2017-03-01 | Discharge: 2017-03-01 | Disposition: A | Discharge status: Home / Self Care | Payer: BLUE CROSS/BLUE SHIELD | Source: Ambulatory Visit | Attending: Family Medicine | Admitting: Family Medicine

## 2017-03-01 ENCOUNTER — Encounter: Payer: Self-pay | Admitting: Family Medicine

## 2017-03-01 VITALS — BP 116/78 | HR 77 | Temp 98.5°F | Resp 16 | Ht 63.0 in | Wt 108.4 lb

## 2017-03-01 DIAGNOSIS — S20212A Contusion of left front wall of thorax, initial encounter: Secondary | ICD-10-CM | POA: Insufficient documentation

## 2017-03-01 DIAGNOSIS — S29011A Strain of muscle and tendon of front wall of thorax, initial encounter: Secondary | ICD-10-CM

## 2017-03-01 DIAGNOSIS — X58XXXA Exposure to other specified factors, initial encounter: Secondary | ICD-10-CM | POA: Diagnosis not present

## 2017-03-01 DIAGNOSIS — R7989 Other specified abnormal findings of blood chemistry: Secondary | ICD-10-CM | POA: Diagnosis not present

## 2017-03-01 DIAGNOSIS — M810 Age-related osteoporosis without current pathological fracture: Secondary | ICD-10-CM | POA: Diagnosis not present

## 2017-03-01 NOTE — Progress Notes (Signed)
Name: Amanda Osborn   MRN: [id number]    DOB: [birth year]   Date:03/02/2017       Progress Note  Subjective  Chief Complaint  Chief Complaint  Patient presents with  . Chest Pain    rib pain on left side near back. Saturday started all of sudden.   . Fatigue    Chest Pain   This is a new problem. The current episode started in the past 7 days. The onset quality is sudden. The problem occurs constantly. The problem has been gradually worsening. The pain is present in the lateral region. The pain is at a severity of 6/10. The pain is moderate. The quality of the pain is described as stabbing. The pain does not radiate. Pertinent negatives include no abdominal pain, back pain, claudication, cough, diaphoresis, dizziness, exertional chest pressure, fever, headaches, hemoptysis, irregular heartbeat, leg pain, lower extremity edema, malaise/fatigue, nausea, near-syncope, numbness, orthopnea, palpitations, PND, shortness of breath, sputum production, syncope, vomiting or weakness.    No problem-specific Assessment & Plan notes found for this encounter.   Past Medical History:  Diagnosis Date  . Allergy   . Cancer ([place])   . Osteoporosis     Past Surgical History:  Procedure Laterality Date  . AUGMENTATION MAMMAPLASTY Bilateral 2011  . BREAST CYST ASPIRATION Bilateral   . BREAST EXCISIONAL BIOPSY Left 2006   breast ca  . BREAST LUMPECTOMY    . CARDIAC CATHETERIZATION    . COLONOSCOPY    . COLONOSCOPY WITH PROPOFOL N/A 06/28/2016   Procedure: COLONOSCOPY WITH PROPOFOL;  Surgeon: Lollie Sails, MD;  Location: [facility] ENDOSCOPY;  Service: Endoscopy;  Laterality: N/A;  . MASTECTOMY Bilateral   . VAGINAL HYSTERECTOMY      Family History  Problem Relation Age of Onset  . Breast cancer Paternal Aunt 70  . Breast cancer Paternal Grandmother 11    Social History   Socioeconomic History  . Marital status: Married    Spouse name: Not on file  . Number of children: Not on file  .  Years of education: Not on file  . Highest education level: Not on file  Social Needs  . Financial resource strain: Not on file  . Food insecurity - worry: Not on file  . Food insecurity - inability: Not on file  . Transportation needs - medical: Not on file  . Transportation needs - non-medical: Not on file  Occupational History  . Not on file  Tobacco Use  . Smoking status: Never Smoker  . Smokeless tobacco: Never Used  Substance and Sexual Activity  . Alcohol use: No    Alcohol/week: 0.0 oz  . Drug use: No  . Sexual activity: Yes  Other Topics Concern  . Not on file  Social History Narrative  . Not on file    Allergies  Allergen Reactions  . Montelukast Sodium     Outpatient Medications Prior to Visit  Medication Sig Dispense Refill  . ibandronate (BONIVA) 150 MG tablet Take 1 tablet by mouth every 30 (thirty) days.    . meloxicam (MOBIC) 7.5 MG tablet Take 7.5 mg daily by mouth. Reche Dixon  1  . Multiple Vitamin (MULTI-VITAMINS) TABS Take 1 tablet by mouth daily.    . traMADol (ULTRAM) 50 MG tablet Take 1 tablet (50 mg total) by mouth every 6 (six) hours as needed. 30 tablet 0  . traZODone (DESYREL) 50 MG tablet Take 2 tablets at bedtime by mouth. Dr Nicolasa Ducking  0  . loratadine (  CLARITIN) 10 MG tablet Take 1 tablet by mouth daily. otc    . acyclovir (ZOVIRAX) 800 MG tablet Take 1 tablet by mouth 2 (two) times daily.    Marland Kitchen acyclovir (ZOVIRAX) 800 MG tablet Take 1 tablet (800 mg total) 2 (two) times daily by mouth. 20 tablet 1  . amoxicillin (AMOXIL) 500 MG capsule Take 1 capsule (500 mg total) 3 (three) times daily by mouth. 30 capsule 0  . aspirin EC 81 MG tablet Take 1 tablet daily by mouth.    Marland Kitchen guaiFENesin-codeine (ROBITUSSIN AC) 100-10 MG/5ML syrup Take 5 mLs 3 (three) times daily as needed by mouth for cough. 150 mL 0  . nystatin (MYCOSTATIN) 100000 UNIT/ML suspension TAKE 5 MLS (500,000 UNITS TOTAL) 4 (FOUR) TIMES DAILY BY MOUTH. 60 mL 0   No facility-administered  medications prior to visit.     Review of Systems  Constitutional: Negative for chills, diaphoresis, fever, malaise/fatigue and weight loss.  HENT: Negative for ear discharge, ear pain and sore throat.   Eyes: Negative for blurred vision.  Respiratory: Negative for cough, hemoptysis, sputum production, shortness of breath and wheezing.   Cardiovascular: Positive for chest pain. Negative for palpitations, orthopnea, claudication, leg swelling, syncope, PND and near-syncope.  Gastrointestinal: Negative for abdominal pain, blood in stool, constipation, diarrhea, heartburn, melena, nausea and vomiting.  Genitourinary: Negative for dysuria, frequency, hematuria and urgency.  Musculoskeletal: Negative for back pain, joint pain, myalgias and neck pain.  Skin: Negative for rash.  Neurological: Negative for dizziness, tingling, sensory change, focal weakness, weakness, numbness and headaches.  Endo/Heme/Allergies: Negative for environmental allergies and polydipsia. Does not bruise/bleed easily.  Psychiatric/Behavioral: Negative for depression and suicidal ideas. The patient is not nervous/anxious and does not have insomnia.      Objective  Vitals:   03/01/17 1358  BP: 116/78  Pulse: 77  Resp: 16  Temp: 98.5 F (36.9 C)  SpO2: 97%  Weight: 108 lb 6.4 oz (49.2 kg)  Height: 5\' 3"  (1.6 m)    Physical Exam  Constitutional: She is well-developed, well-nourished, and in no distress. No distress.  HENT:  Head: Normocephalic and atraumatic.  Right Ear: External ear normal.  Left Ear: External ear normal.  Nose: Nose normal.  Mouth/Throat: Oropharynx is clear and moist.  Eyes: Conjunctivae and EOM are normal. Pupils are equal, round, and reactive to light. Right eye exhibits no discharge. Left eye exhibits no discharge.  Neck: Normal range of motion. Neck supple. No JVD present. No thyromegaly present.  Cardiovascular: Normal rate, regular rhythm, normal heart sounds and intact distal  pulses. Exam reveals no gallop and no friction rub.  No murmur heard. Pulmonary/Chest: Effort normal and breath sounds normal. She has no wheezes. She has no rales. Chest wall is not dull to percussion. She exhibits tenderness and bony tenderness. She exhibits no mass, no laceration, no crepitus, no edema, no deformity, no swelling and no retraction.  Abdominal: Soft. Bowel sounds are normal. She exhibits no mass. There is no tenderness. There is no guarding.  Musculoskeletal: Normal range of motion. She exhibits no edema.  Lymphadenopathy:    She has no cervical adenopathy.  Neurological: She is alert. She has normal reflexes.  Skin: Skin is warm and dry. She is not diaphoretic.  Psychiatric: Mood and affect normal.  Nursing note and vitals reviewed.     Assessment & Plan  Problem List Items Addressed This Visit    None    Visit Diagnoses    Contusion of rib on  left side, initial encounter    -  Primary   Relevant Orders   DG Ribs Unilateral Left   Intercostal muscle strain, initial encounter       Age-related osteoporosis without current pathological fracture          No orders of the defined types were placed in this encounter.     Dr. [facility]  03/02/17

## 2017-03-01 NOTE — Patient Instructions (Signed)
Rib Fracture ° °A rib fracture is a break or crack in one of the bones of the ribs. The ribs are a group of long, curved bones that wrap around your chest and attach to your spine. They protect your lungs and other organs in the chest cavity. A broken or cracked rib is often painful, but most do not cause other problems. Most rib fractures heal on their own over time. However, rib fractures can be more serious if multiple ribs are broken or if broken ribs move out of place and push against other structures. °What are the causes? °· A direct blow to the chest. For example, this could happen during contact sports, a car accident, or a fall against a hard object. °· Repetitive movements with high force, such as pitching a baseball or having severe coughing spells. °What are the signs or symptoms? °· Pain when you breathe in or cough. °· Pain when someone presses on the injured area. °How is this diagnosed? °Your caregiver will perform a physical exam. Various imaging tests may be ordered to confirm the diagnosis and to look for related injuries. These tests may include a chest X-ray, computed tomography (CT), magnetic resonance imaging (MRI), or a bone scan. °How is this treated? °Rib fractures usually heal on their own in 1-3 months. The longer healing period is often associated with a continued cough or other aggravating activities. During the healing period, pain control is very important. Medication is usually given to control pain. Hospitalization or surgery may be needed for more severe injuries, such as those in which multiple ribs are broken or the ribs have moved out of place. °Follow these instructions at home: °· Avoid strenuous activity and any activities or movements that cause pain. Be careful during activities and avoid bumping the injured rib. °· Gradually increase activity as directed by your caregiver. °· Only take over-the-counter or prescription medications as directed by your caregiver. Do not take  other medications without asking your caregiver first. °· Apply ice to the injured area for the first 1-2 days after you have been treated or as directed by your caregiver. Applying ice helps to reduce inflammation and pain. °? Put ice in a plastic bag. °? Place a towel between your skin and the bag. °? Leave the ice on for 15-20 minutes at a time, every 2 hours while you are awake. °· Perform deep breathing as directed by your caregiver. This will help prevent pneumonia, which is a common complication of a broken rib. Your caregiver may instruct you to: °? Take deep breaths several times a day. °? Try to cough several times a day, holding a pillow against the injured area. °? Use a device called an incentive spirometer to practice deep breathing several times a day. °· Drink enough fluids to keep your urine clear or pale yellow. This will help you avoid constipation. °· Do not wear a rib belt or binder. These restrict breathing, which can lead to pneumonia. °Get help right away if: °· You have a fever. °· You have difficulty breathing or shortness of breath. °· You develop a continual cough, or you cough up thick or bloody sputum. °· You feel sick to your stomach (nausea), throw up (vomit), or have abdominal pain. °· You have worsening pain not controlled with medications. °This information is not intended to replace advice given to you by your health care provider. Make sure you discuss any questions you have with your health care provider. °Document Released: 03/29/2005   Document Revised: 09/04/2015 Document Reviewed: 05/31/2012 °[employer] Interactive Patient Education © 2018 [employer] ° °

## 2017-03-10 ENCOUNTER — Other Ambulatory Visit: Payer: Self-pay | Admitting: Obstetrics and Gynecology

## 2017-03-10 DIAGNOSIS — Z1239 Encounter for other screening for malignant neoplasm of breast: Secondary | ICD-10-CM

## 2017-03-11 DIAGNOSIS — R0602 Shortness of breath: Secondary | ICD-10-CM | POA: Diagnosis not present

## 2017-03-11 DIAGNOSIS — I208 Other forms of angina pectoris: Secondary | ICD-10-CM | POA: Diagnosis not present

## 2017-03-15 ENCOUNTER — Other Ambulatory Visit: Payer: Self-pay | Admitting: Obstetrics and Gynecology

## 2017-03-17 ENCOUNTER — Other Ambulatory Visit: Payer: Self-pay | Admitting: Obstetrics and Gynecology

## 2017-03-17 DIAGNOSIS — Z1239 Encounter for other screening for malignant neoplasm of breast: Secondary | ICD-10-CM

## 2017-03-18 DIAGNOSIS — R0602 Shortness of breath: Secondary | ICD-10-CM | POA: Diagnosis not present

## 2017-03-18 DIAGNOSIS — R42 Dizziness and giddiness: Secondary | ICD-10-CM | POA: Diagnosis not present

## 2017-03-18 DIAGNOSIS — F419 Anxiety disorder, unspecified: Secondary | ICD-10-CM | POA: Diagnosis not present

## 2017-03-18 DIAGNOSIS — I208 Other forms of angina pectoris: Secondary | ICD-10-CM | POA: Diagnosis not present

## 2017-04-12 DIAGNOSIS — I639 Cerebral infarction, unspecified: Secondary | ICD-10-CM

## 2017-04-12 HISTORY — DX: Cerebral infarction, unspecified: I63.9

## 2017-04-15 ENCOUNTER — Ambulatory Visit
Admission: RE | Admit: 2017-04-15 | Discharge: 2017-04-15 | Disposition: A | Discharge status: Home / Self Care | Payer: BLUE CROSS/BLUE SHIELD | Source: Ambulatory Visit | Attending: Obstetrics and Gynecology | Admitting: Obstetrics and Gynecology

## 2017-04-15 DIAGNOSIS — Z853 Personal history of malignant neoplasm of breast: Secondary | ICD-10-CM | POA: Diagnosis not present

## 2017-04-15 DIAGNOSIS — F5105 Insomnia due to other mental disorder: Secondary | ICD-10-CM | POA: Diagnosis not present

## 2017-04-15 DIAGNOSIS — F411 Generalized anxiety disorder: Secondary | ICD-10-CM | POA: Diagnosis not present

## 2017-04-15 DIAGNOSIS — Z1239 Encounter for other screening for malignant neoplasm of breast: Secondary | ICD-10-CM

## 2017-04-15 MED ORDER — GADOBENATE DIMEGLUMINE 529 MG/ML IV SOLN
9.0000 mL | Freq: Once | INTRAVENOUS | Status: AC | PRN
Start: 2017-04-15 — End: 2017-04-15
  Administered 2017-04-15: 9 mL via INTRAVENOUS

## 2017-05-12 ENCOUNTER — Ambulatory Visit: Payer: BLUE CROSS/BLUE SHIELD | Admitting: Family Medicine

## 2017-05-12 ENCOUNTER — Encounter: Payer: Self-pay | Admitting: Family Medicine

## 2017-05-12 VITALS — BP 130/80 | HR 64 | Ht 63.0 in | Wt 106.0 lb

## 2017-05-12 DIAGNOSIS — S76312A Strain of muscle, fascia and tendon of the posterior muscle group at thigh level, left thigh, initial encounter: Secondary | ICD-10-CM

## 2017-05-12 DIAGNOSIS — J0140 Acute pansinusitis, unspecified: Secondary | ICD-10-CM

## 2017-05-12 DIAGNOSIS — H6983 Other specified disorders of Eustachian tube, bilateral: Secondary | ICD-10-CM | POA: Diagnosis not present

## 2017-05-12 MED ORDER — AMOXICILLIN-POT CLAVULANATE 875-125 MG PO TABS: 1.0000 | ORAL_TABLET | Freq: Two times a day (BID) | ORAL | 0 refills | Status: DC

## 2017-05-12 MED ORDER — GUAIFENESIN-CODEINE 100-10 MG/5ML PO SYRP: 5.0000 mL | ORAL_SOLUTION | Freq: Three times a day (TID) | ORAL | 0 refills | Status: DC | PRN

## 2017-05-12 NOTE — Progress Notes (Signed)
Name: Amanda Osborn   MRN: [id number]    DOB: 08-24-1955   Date:05/12/2017       Progress Note  Subjective  Chief Complaint  Chief Complaint  Patient presents with  . ear pressure    bilateral pain with pressure in head  . Leg Pain    L) thigh pain- husband noticed a bruise on back of leg/ sore    Leg Pain   The incident occurred 5 to 7 days ago. There was no injury mechanism. The pain is present in the left leg (hamstring). The quality of the pain is described as aching. The pain is at a severity of 4/10. The pain is moderate. The pain has been constant since onset. Pertinent negatives include no inability to bear weight, loss of motion, loss of sensation, muscle weakness, numbness or tingling. She has tried NSAIDs for the symptoms. The treatment provided mild relief.  Otalgia   There is pain in both ears. This is a new problem. The current episode started 1 to 4 weeks ago (10 days). The problem occurs constantly. The problem has been gradually worsening. There has been no fever. The pain is mild. Pertinent negatives include no abdominal pain, coughing, diarrhea, ear discharge, headaches, hearing loss, neck pain, rash, rhinorrhea, sore throat or vomiting. She has tried nothing for the symptoms. The treatment provided mild relief.  Sinusitis  This is a new problem. The current episode started in the past 7 days. The problem has been gradually worsening since onset. There has been no fever. Her pain is at a severity of 5/10 (behind eyes). The pain is mild. Associated symptoms include congestion, ear pain, sinus pressure and sneezing. Pertinent negatives include no chills, coughing, diaphoresis, headaches, neck pain, shortness of breath or sore throat. (Post nasal drainage) Past treatments include oral decongestants. The treatment provided no relief.    No problem-specific Assessment & Plan notes found for this encounter.   Past Medical History:  Diagnosis Date  . Allergy   . Cancer ([place])    . Osteoporosis     Past Surgical History:  Procedure Laterality Date  . AUGMENTATION MAMMAPLASTY Bilateral 2011  . BREAST CYST ASPIRATION Bilateral   . BREAST EXCISIONAL BIOPSY Left 2006   breast ca  . BREAST LUMPECTOMY    . CARDIAC CATHETERIZATION    . COLONOSCOPY    . COLONOSCOPY WITH PROPOFOL N/A 06/28/2016   Procedure: COLONOSCOPY WITH PROPOFOL;  Surgeon: Lollie Sails, MD;  Location: [facility] ENDOSCOPY;  Service: Endoscopy;  Laterality: N/A;  . MASTECTOMY Bilateral   . VAGINAL HYSTERECTOMY      Family History  Problem Relation Age of Onset  . Breast cancer Paternal Aunt 62  . Breast cancer Paternal Grandmother 49    Social History   Socioeconomic History  . Marital status: Married    Spouse name: Not on file  . Number of children: Not on file  . Years of education: Not on file  . Highest education level: Not on file  Social Needs  . Financial resource strain: Not on file  . Food insecurity - worry: Not on file  . Food insecurity - inability: Not on file  . Transportation needs - medical: Not on file  . Transportation needs - non-medical: Not on file  Occupational History  . Not on file  Tobacco Use  . Smoking status: Never Smoker  . Smokeless tobacco: Never Used  Substance and Sexual Activity  . Alcohol use: No    Alcohol/week: 0.0 oz  .  Drug use: No  . Sexual activity: Yes  Other Topics Concern  . Not on file  Social History Narrative  . Not on file    Allergies  Allergen Reactions  . Montelukast Sodium     Outpatient Medications Prior to Visit  Medication Sig Dispense Refill  . ibandronate (BONIVA) 150 MG tablet Take 1 tablet by mouth every 30 (thirty) days.    Marland Kitchen loratadine (CLARITIN) 10 MG tablet Take 1 tablet by mouth daily. otc    . meloxicam (MOBIC) 7.5 MG tablet Take 7.5 mg daily by mouth. Reche Dixon  1  . Multiple Vitamin (MULTI-VITAMINS) TABS Take 1 tablet by mouth daily.    . traMADol (ULTRAM) 50 MG tablet Take 1 tablet (50 mg total)  by mouth every 6 (six) hours as needed. 30 tablet 0  . traZODone (DESYREL) 50 MG tablet Take 2 tablets at bedtime by mouth. Dr Nicolasa Ducking  0   No facility-administered medications prior to visit.     Review of Systems  Constitutional: Negative for chills, diaphoresis, fever, malaise/fatigue and weight loss.  HENT: Positive for congestion, ear pain, sinus pressure and sneezing. Negative for ear discharge, hearing loss, rhinorrhea and sore throat.   Eyes: Negative for blurred vision.  Respiratory: Negative for cough, sputum production, shortness of breath and wheezing.   Cardiovascular: Negative for chest pain, palpitations and leg swelling.  Gastrointestinal: Negative for abdominal pain, blood in stool, constipation, diarrhea, heartburn, melena, nausea and vomiting.  Genitourinary: Negative for dysuria, frequency, hematuria and urgency.  Musculoskeletal: Negative for back pain, joint pain, myalgias and neck pain.  Skin: Negative for rash.  Neurological: Negative for dizziness, tingling, sensory change, focal weakness, numbness and headaches.  Endo/Heme/Allergies: Negative for environmental allergies and polydipsia. Does not bruise/bleed easily.  Psychiatric/Behavioral: Negative for depression and suicidal ideas. The patient is not nervous/anxious and does not have insomnia.      Objective  Vitals:   05/12/17 1601  BP: 130/80  Pulse: 64  Weight: 106 lb (48.1 kg)  Height: 5\' 3"  (1.6 m)    Physical Exam  Constitutional: She is well-developed, well-nourished, and in no distress. No distress.  HENT:  Head: Normocephalic and atraumatic.  Right Ear: External ear normal. Tympanic membrane is retracted.  Left Ear: External ear normal. Tympanic membrane is retracted.  Nose: Right sinus exhibits maxillary sinus tenderness and frontal sinus tenderness. Left sinus exhibits maxillary sinus tenderness and frontal sinus tenderness.  Mouth/Throat: Posterior oropharyngeal erythema present. No  oropharyngeal exudate, posterior oropharyngeal edema or tonsillar abscesses.  Eyes: Conjunctivae and EOM are normal. Pupils are equal, round, and reactive to light. Right eye exhibits no discharge. Left eye exhibits no discharge.  Neck: Normal range of motion. Neck supple. No JVD present. No thyromegaly present.  Cardiovascular: Normal rate, regular rhythm, normal heart sounds and intact distal pulses. Exam reveals no gallop and no friction rub.  No murmur heard. Pulmonary/Chest: Effort normal and breath sounds normal. No accessory muscle usage. No respiratory distress. She has no wheezes. She has no rales.  Abdominal: Soft. Bowel sounds are normal. She exhibits no mass. There is no tenderness. There is no guarding.  Musculoskeletal: Normal range of motion. She exhibits no edema.  Lymphadenopathy:       Head (right side): No submandibular adenopathy present.       Head (left side): No submandibular adenopathy present.    She has no cervical adenopathy.  Neurological: She is alert. She has normal reflexes.  Skin: Skin is warm and dry.  She is not diaphoretic.  Psychiatric: Mood and affect normal.  Nursing note and vitals reviewed.     Assessment & Plan  Problem List Items Addressed This Visit    None    Visit Diagnoses    Eustachian tube dysfunction, bilateral    -  Primary   sudafed   Acute non-recurrent pansinusitis       Relevant Medications   amoxicillin-clavulanate (AUGMENTIN) 875-125 MG tablet   guaiFENesin-codeine (ROBITUSSIN AC) 100-10 MG/5ML syrup   Strain of left hamstring, initial encounter       tylenol/strain      Meds ordered this encounter  Medications  . amoxicillin-clavulanate (AUGMENTIN) 875-125 MG tablet    Sig: Take 1 tablet by mouth 2 (two) times daily.    Dispense:  20 tablet    Refill:  0  . guaiFENesin-codeine (ROBITUSSIN AC) 100-10 MG/5ML syrup    Sig: Take 5 mLs by mouth 3 (three) times daily as needed for cough.    Dispense:  150 mL    Refill:  0       Dr. Mac[facility]  05/12/17

## 2017-05-12 NOTE — Patient Instructions (Signed)
Hamstring Strain A hamstring strain is an injury that occurs when the hamstring muscles are overstretched or overloaded. The hamstring muscles are a group of muscles at the back of the thighs. These muscles are used in straightening the hips, bending the knees, and pulling back the legs. This type of injury is often called a pulled hamstring muscle. The severity of a muscle strain is rated in degrees. First-degree strains have the least amount of muscle fiber tearing and pain. Second-degree and third-degree strains have increasingly more tearing and pain. What are the causes? Hamstring strains occur when a sudden, violent force is placed on these muscles and stretches them too far. This often occurs during activities that involve running, jumping, kicking, or weight lifting. What increases the risk? Hamstring strains are especially common in athletes. Other things that can increase your risk for this injury include:  Having low strength, endurance, or flexibility of the hamstring muscles.  Performing high-impact physical activity.  Having poor physical fitness.  Having a previous leg injury.  Having fatigued muscles.  Older age.  What are the signs or symptoms?  Pain in the back of the thigh.  Bruising.  Swelling.  Muscle spasm.  Difficulty using the muscle because of pain or lack of normal function. For severe strains, you may have a popping or snapping feeling when the injury occurs. How is this diagnosed? Your health care provider will perform a physical exam and ask about your medical history. How is this treated? Often, the best treatment for a hamstring strain is protecting, resting, icing, applying compression, and elevating the injured area. This is referred to as the PRICE method of treatment. Your health care provider may also recommend medicines to help reduce pain or inflammation. Follow these instructions at home:  Use the PRICE method of treatment to promote muscle  healing during the first 2-3 days after your injury. The PRICE method involves: ? P-Protecting the muscle from being injured again. ? R-Restricting your activity and resting the injured body part. ? I-Icing your injury. To do this, put ice in a plastic bag. Place a towel between your skin and the bag. Then, apply the ice and leave it on for 20 minutes, 2-3 times per day. After the third day, switch to moist heat packs. ? C-Applying compression to the injured area with an elastic bandage. Be careful not to wrap it too tightly. That may interfere with blood circulation or may increase swelling. ? E-Elevating the injured body part above the level of your heart as often as you can. You can do this by putting a pillow under your thigh when you sit or lie down.  Take medicines only as directed by your health care provider.  Begin exercising or stretching as directed by your health care provider.  Do not return to full activity level until your health care provider approves.  Keep all follow-up visits as directed by your health care provider. This is important. Contact a health care provider if:  You have increasing pain or swelling in the injured area.  You have numbness, tingling, or a significant loss of strength in the injured area.  Your foot or your toes become cold or turn blue. This information is not intended to replace advice given to you by your health care provider. Make sure you discuss any questions you have with your health care provider. Document Released: 12/22/2000 Document Revised: 09/04/2015 Document Reviewed: 11/12/2013 Elsevier Interactive Patient Education  2018 [employer]. Eustachian Tube Dysfunction The eustachian  tube connects the middle ear to the back of the nose. It regulates air pressure in the middle ear by allowing air to move between the ear and nose. It also helps to drain fluid from the middle ear space. When the eustachian tube does not function properly, air  pressure, fluid, or both can build up in the middle ear. Eustachian tube dysfunction can affect one or both ears. What are the causes? This condition happens when the eustachian tube becomes blocked or cannot open normally. This may result from:  Ear infections.  Colds and other upper respiratory infections.  Allergies.  Irritation, such as from cigarette smoke or acid from the stomach coming up into the esophagus (gastroesophageal reflux).  Sudden changes in air pressure, such as from descending in an airplane.  Abnormal growths in the nose or throat, such as nasal polyps, tumors, or enlarged tissue at the back of the throat (adenoids).  What increases the risk? This condition may be more likely to develop in people who smoke and people who are overweight. Eustachian tube dysfunction may also be more likely to develop in children, especially children who have:  Certain birth defects of the mouth, such as cleft palate.  Large tonsils and adenoids.  What are the signs or symptoms? Symptoms of this condition may include:  A feeling of fullness in the ear.  Ear pain.  Clicking or popping noises in the ear.  Ringing in the ear.  Hearing loss.  Loss of balance.  Symptoms may get worse when the air pressure around you changes, such as when you travel to an area of high elevation or fly on an airplane. How is this diagnosed? This condition may be diagnosed based on:  Your symptoms.  A physical exam of your ear, nose, and throat.  Tests, such as those that measure: ? The movement of your eardrum (tympanogram). ? Your hearing (audiometry).  How is this treated? Treatment depends on the cause and severity of your condition. If your symptoms are mild, you may be able to relieve your symptoms by moving air into ("popping") your ears. If you have symptoms of fluid in your ears, treatment may include:  Decongestants.  Antihistamines.  Nasal sprays or ear drops that contain  medicines that reduce swelling (steroids).  In some cases, you may need to have a procedure to drain the fluid in your eardrum (myringotomy). In this procedure, a small tube is placed in the eardrum to:  Drain the fluid.  Restore the air in the middle ear space.  Follow these instructions at home:  Take over-the-counter and prescription medicines only as told by your health care provider.  Use techniques to help pop your ears as recommended by your health care provider. These may include: ? Chewing gum. ? Yawning. ? Frequent, forceful swallowing. ? Closing your mouth, holding your nose closed, and gently blowing as if you are trying to blow air out of your nose.  Do not do any of the following until your health care provider approves: ? Travel to high altitudes. ? Fly in airplanes. ? Work in a Pension scheme manager or room. ? Scuba dive.  Keep your ears dry. Dry your ears completely after showering or bathing.  Do not smoke.  Keep all follow-up visits as told by your health care provider. This is important. Contact a health care provider if:  Your symptoms do not go away after treatment.  Your symptoms come back after treatment.  You are unable to pop your  ears.  You have: ? A fever. ? Pain in your ear. ? Pain in your head or neck. ? Fluid draining from your ear.  Your hearing suddenly changes.  You become very dizzy.  You lose your balance. This information is not intended to replace advice given to you by your health care provider. Make sure you discuss any questions you have with your health care provider. Document Released: 04/25/2015 Document Revised: 09/04/2015 Document Reviewed: 04/17/2014 Elsevier Interactive Patient Education  [employer].

## 2017-06-17 DIAGNOSIS — F5105 Insomnia due to other mental disorder: Secondary | ICD-10-CM | POA: Diagnosis not present

## 2017-06-17 DIAGNOSIS — F411 Generalized anxiety disorder: Secondary | ICD-10-CM | POA: Diagnosis not present

## 2017-09-16 DIAGNOSIS — F411 Generalized anxiety disorder: Secondary | ICD-10-CM | POA: Diagnosis not present

## 2017-09-16 DIAGNOSIS — F5105 Insomnia due to other mental disorder: Secondary | ICD-10-CM | POA: Diagnosis not present

## 2017-10-20 ENCOUNTER — Ambulatory Visit
Admission: RE | Admit: 2017-10-20 | Discharge: 2017-10-20 | Disposition: A | Discharge status: Home / Self Care | Payer: BLUE CROSS/BLUE SHIELD | Source: Ambulatory Visit | Attending: Family Medicine | Admitting: Family Medicine

## 2017-10-20 ENCOUNTER — Encounter: Payer: Self-pay | Admitting: Family Medicine

## 2017-10-20 ENCOUNTER — Ambulatory Visit: Payer: BLUE CROSS/BLUE SHIELD | Admitting: Family Medicine

## 2017-10-20 VITALS — BP 113/76 | HR 79 | Resp 16 | Ht 63.0 in | Wt 103.0 lb

## 2017-10-20 DIAGNOSIS — M5090 Cervical disc disorder, unspecified, unspecified cervical region: Secondary | ICD-10-CM

## 2017-10-20 DIAGNOSIS — R05 Cough: Secondary | ICD-10-CM | POA: Diagnosis not present

## 2017-10-20 DIAGNOSIS — I700 Atherosclerosis of aorta: Secondary | ICD-10-CM | POA: Insufficient documentation

## 2017-10-20 DIAGNOSIS — M542 Cervicalgia: Secondary | ICD-10-CM | POA: Diagnosis not present

## 2017-10-20 DIAGNOSIS — R5383 Other fatigue: Secondary | ICD-10-CM

## 2017-10-20 DIAGNOSIS — R5381 Other malaise: Secondary | ICD-10-CM

## 2017-10-20 DIAGNOSIS — R059 Cough, unspecified: Secondary | ICD-10-CM

## 2017-10-20 DIAGNOSIS — R634 Abnormal weight loss: Secondary | ICD-10-CM

## 2017-10-20 DIAGNOSIS — M50321 Other cervical disc degeneration at C4-C5 level: Secondary | ICD-10-CM | POA: Diagnosis not present

## 2017-10-20 NOTE — Assessment & Plan Note (Addendum)
recurrence Patient with recent increased in muscular neck pain/tenderness over past 2-3 months. Proceed with cervical spine xray.

## 2017-10-20 NOTE — Progress Notes (Signed)
Name: Amanda Osborn   MRN: [id number]    DOB: 1955-11-08   Date:10/20/2017       Progress Note  Subjective  Chief Complaint  Chief Complaint  Patient presents with  . Ear Pain    2 weeks tender behind ears. -  . Alopecia  . Weight Loss  . Cough  . Dizziness    few months constant     Neck Pain   This is a new problem. The current episode started more than 1 month ago ("several months"). The problem has been gradually worsening. The pain is associated with nothing. The pain is present in the occipital region. The quality of the pain is described as aching. The pain is moderate. The symptoms are aggravated by twisting and bending. Associated symptoms include headaches and weight loss. Pertinent negatives include no chest pain, fever, leg pain, numbness, pain with swallowing, paresis, tingling, trouble swallowing, visual change or weakness. She has tried acetaminophen and NSAIDs for the symptoms. The treatment provided moderate relief.  Cough  This is a chronic problem. The current episode started more than 1 month ago (2-3 months). The problem has been waxing and waning. The cough is non-productive. Associated symptoms include ear pain, headaches and weight loss. Pertinent negatives include no chest pain, chills, ear congestion, eye redness, fever, heartburn, hemoptysis, myalgias, nasal congestion, postnasal drip, rash, rhinorrhea, sore throat, shortness of breath, sweats or wheezing. Associated symptoms comments: Thirsty/. The treatment provided moderate relief. There is no history of asthma, bronchiectasis, bronchitis, COPD, emphysema, environmental allergies or pneumonia.  Other  This is a new (weight loss) problem. The current episode started more than 1 month ago. The problem has been waxing and waning. Associated symptoms include coughing, diaphoresis, fatigue, headaches and neck pain. Pertinent negatives include no abdominal pain, anorexia, change in bowel habit, chest pain, chills,  congestion, fever, myalgias, nausea, numbness, rash, sore throat, vertigo, visual change or weakness.  Thyroid Problem  Presents for initial visit. Symptoms include constipation, depressed mood, diaphoresis, diarrhea, fatigue, hair loss, palpitations and weight loss. Patient reports no anxiety, cold intolerance, dry skin, heat intolerance, hoarse voice, leg swelling, menstrual problem, nail problem, tremors, visual change or weight gain.    Cervical disc disease recurrence Patient with recent increased in muscular neck pain/tenderness over past 2-3 months. Proceed with cervical spine xray.   Past Medical History:  Diagnosis Date  . Allergy   . Cancer ([place])   . Osteoporosis     Past Surgical History:  Procedure Laterality Date  . AUGMENTATION MAMMAPLASTY Bilateral 2011  . BREAST CYST ASPIRATION Bilateral   . BREAST EXCISIONAL BIOPSY Left 2006   breast ca  . BREAST LUMPECTOMY    . CARDIAC CATHETERIZATION    . COLONOSCOPY    . COLONOSCOPY WITH PROPOFOL N/A 06/28/2016   Procedure: COLONOSCOPY WITH PROPOFOL;  Surgeon: Lollie Sails, MD;  Location: [facility] ENDOSCOPY;  Service: Endoscopy;  Laterality: N/A;  . MASTECTOMY Bilateral   . VAGINAL HYSTERECTOMY      Family History  Problem Relation Age of Onset  . Breast cancer Paternal Aunt 69  . Breast cancer Paternal Grandmother 52    Social History   Socioeconomic History  . Marital status: Married    Spouse name: Not on file  . Number of children: Not on file  . Years of education: Not on file  . Highest education level: Not on file  Occupational History  . Not on file  Social Needs  . Financial resource strain: Not  on file  . Food insecurity:    Worry: Not on file    Inability: Not on file  . Transportation needs:    Medical: Not on file    Non-medical: Not on file  Tobacco Use  . Smoking status: Never Smoker  . Smokeless tobacco: Never Used  Substance and Sexual Activity  . Alcohol use: No    Alcohol/week: 0.0 oz   . Drug use: No  . Sexual activity: Yes  Lifestyle  . Physical activity:    Days per week: Not on file    Minutes per session: Not on file  . Stress: Not on file  Relationships  . Social connections:    Talks on phone: Not on file    Gets together: Not on file    Attends religious service: Not on file    Active member of club or organization: Not on file    Attends meetings of clubs or organizations: Not on file    Relationship status: Not on file  . Intimate partner violence:    Fear of current or ex partner: Not on file    Emotionally abused: Not on file    Physically abused: Not on file    Forced sexual activity: Not on file  Other Topics Concern  . Not on file  Social History Narrative  . Not on file    Allergies  Allergen Reactions  . Montelukast Sodium     Outpatient Medications Prior to Visit  Medication Sig Dispense Refill  . ibandronate (BONIVA) 150 MG tablet Take 1 tablet by mouth every 30 (thirty) days.    . meloxicam (MOBIC) 7.5 MG tablet Take 7.5 mg daily by mouth. Reche Dixon  1  . Multiple Vitamin (MULTI-VITAMINS) TABS Take 1 tablet by mouth daily.    . traMADol (ULTRAM) 50 MG tablet Take 1 tablet (50 mg total) by mouth every 6 (six) hours as needed. 30 tablet 0  . traZODone (DESYREL) 50 MG tablet Take 2 tablets at bedtime by mouth. Dr Nicolasa Ducking  0  . venlafaxine XR (EFFEXOR-XR) 150 MG 24 hr capsule Take 150 mg by mouth daily with breakfast. Dr.kapur    . loratadine (CLARITIN) 10 MG tablet Take 1 tablet by mouth daily. otc    . amoxicillin-clavulanate (AUGMENTIN) 875-125 MG tablet Take 1 tablet by mouth 2 (two) times daily. 20 tablet 0  . guaiFENesin-codeine (ROBITUSSIN AC) 100-10 MG/5ML syrup Take 5 mLs by mouth 3 (three) times daily as needed for cough. 150 mL 0   No facility-administered medications prior to visit.     Review of Systems  Constitutional: Positive for diaphoresis, fatigue and weight loss. Negative for chills, fever, malaise/fatigue and  weight gain.  HENT: Positive for ear pain. Negative for congestion, ear discharge, hearing loss, hoarse voice, nosebleeds, postnasal drip, rhinorrhea, sinus pain, sore throat, tinnitus and trouble swallowing.   Eyes: Negative for blurred vision, pain, discharge and redness.  Respiratory: Positive for cough. Negative for hemoptysis, sputum production, shortness of breath, wheezing and stridor.   Cardiovascular: Positive for palpitations. Negative for chest pain, orthopnea, claudication, leg swelling and PND.  Gastrointestinal: Positive for constipation and diarrhea. Negative for abdominal pain, anorexia, blood in stool, change in bowel habit, heartburn, melena and nausea.  Genitourinary: Negative for dysuria, frequency, hematuria, menstrual problem and urgency.  Musculoskeletal: Positive for neck pain. Negative for back pain, joint pain and myalgias.  Skin: Negative for itching and rash.  Neurological: Positive for headaches. Negative for dizziness, vertigo, tingling, tremors, sensory  change, focal weakness, weakness and numbness.  Endo/Heme/Allergies: Negative for environmental allergies, cold intolerance, heat intolerance and polydipsia. Does not bruise/bleed easily.  Psychiatric/Behavioral: Negative for depression and suicidal ideas. The patient is not nervous/anxious and does not have insomnia.      Objective  Vitals:   10/20/17 1354  BP: 113/76  Pulse: 79  Resp: 16  SpO2: 99%  Weight: 103 lb (46.7 kg)  Height: 5\' 3"  (1.6 m)    Physical Exam  Constitutional: She is oriented to person, place, and time. She appears well-developed and well-nourished. No distress.  HENT:  Head: Normocephalic and atraumatic.  Right Ear: External ear normal.  Left Ear: External ear normal.  Nose: Nose normal.  Mouth/Throat: Oropharynx is clear and moist.  Eyes: Pupils are equal, round, and reactive to light. Conjunctivae and EOM are normal. Lids are everted and swept, no foreign bodies found. Right  eye exhibits no discharge. Left eye exhibits no discharge and no hordeolum. No foreign body present in the left eye. Right conjunctiva is not injected. Left conjunctiva is not injected. No scleral icterus.  Neck: Normal range of motion and full passive range of motion without pain. Neck supple. Normal carotid pulses, no hepatojugular reflux and no JVD present. Muscular tenderness present. No tracheal tenderness and no spinous process tenderness present. Carotid bruit is not present. No neck rigidity. No tracheal deviation present. No thyroid mass and no thyromegaly present.  Tender along sternocleidomastoids  Cardiovascular: Normal rate, regular rhythm, normal heart sounds and intact distal pulses. Exam reveals no gallop and no friction rub.  No murmur heard.  No systolic murmur is present.  No diastolic murmur is present. Pulmonary/Chest: Effort normal and breath sounds normal. No respiratory distress. She has no wheezes. She has no rales.  Abdominal: Soft. Bowel sounds are normal. She exhibits no mass. There is no hepatosplenomegaly. There is no tenderness. There is no rebound and no guarding.  Musculoskeletal: Normal range of motion. She exhibits no edema.       Cervical back: She exhibits tenderness and spasm.  Lymphadenopathy:       Head (right side): No submental, no submandibular, no tonsillar, no preauricular, no posterior auricular and no occipital adenopathy present.       Head (left side): No submental, no submandibular, no tonsillar, no preauricular, no posterior auricular and no occipital adenopathy present.    She has no cervical adenopathy.       Right cervical: No superficial cervical and no posterior cervical adenopathy present.      Left cervical: No superficial cervical and no posterior cervical adenopathy present.  Neurological: She is alert and oriented to person, place, and time. She has normal strength and normal reflexes. She displays normal reflexes. No cranial nerve  deficit.  Skin: Skin is warm and dry. No rash noted. She is not diaphoretic.  Psychiatric: She has a normal mood and affect. Her mood appears not anxious. She does not exhibit a depressed mood.  Nursing note and vitals reviewed.     Assessment & Plan  Problem List Items Addressed This Visit      Musculoskeletal and Integument   Cervical disc disease - Primary    recurrence Patient with recent increased in muscular neck pain/tenderness over past 2-3 months. Proceed with cervical spine xray.      Relevant Orders   DG Cervical Spine Complete    Other Visit Diagnoses    Cough       recent developement of nonproductive cough.  Poceed  with chest xray to eval cardiopulmonary conserns   Relevant Orders   DG Chest 2 View   Malaise and fatigue       Patient conscern of reltive recent fatigue,hair loss and perceived weight loss. Will proceed with lab evaluation of fatigue/Sed rate/cbc/tsh/renal. Conteffexor   Relevant Orders   Renal Function Panel   CBC with Differential/Platelet   Sedimentation rate   Thyroid Panel With TSH   Weight loss       Reviewed of flowsheet does not suggest recent weight loss. Procced with labs afore mentioned   Relevant Orders   DG Chest 2 View   DG Cervical Spine Complete      No orders of the defined types were placed in this encounter.     Dr. Macon [facility]  10/20/17

## 2017-10-21 ENCOUNTER — Other Ambulatory Visit: Payer: Self-pay

## 2017-10-21 DIAGNOSIS — I700 Atherosclerosis of aorta: Secondary | ICD-10-CM

## 2017-10-21 DIAGNOSIS — H6983 Other specified disorders of Eustachian tube, bilateral: Secondary | ICD-10-CM

## 2017-10-21 LAB — RENAL FUNCTION PANEL
ALBUMIN: 5.1 g/dL — AB (ref 3.6–4.8)
BUN/Creatinine Ratio: 23 (ref 12–28)
BUN: 18 mg/dL (ref 8–27)
CO2: 25 mmol/L (ref 20–29)
CREATININE: 0.77 mg/dL (ref 0.57–1.00)
Calcium: 9.2 mg/dL (ref 8.7–10.3)
Chloride: 103 mmol/L (ref 96–106)
GFR calc Af Amer: 95 mL/min/{1.73_m2} (ref >59)
GFR calc non Af Amer: 82 mL/min/{1.73_m2} (ref >59)
GLUCOSE: 104 mg/dL — AB (ref 65–99)
PHOSPHORUS: 3.3 mg/dL (ref 2.5–4.5)
Potassium: 4.6 mmol/L (ref 3.5–5.2)
SODIUM: 142 mmol/L (ref 134–144)

## 2017-10-21 LAB — CBC WITH DIFFERENTIAL/PLATELET
Basophils Absolute: 0 10*3/uL (ref 0.0–0.2)
Basos: 0 %
EOS (ABSOLUTE): 0 10*3/uL (ref 0.0–0.4)
EOS: 1 %
HEMATOCRIT: 37 % (ref 34.0–46.6)
Hemoglobin: 12.8 g/dL (ref 11.1–15.9)
IMMATURE GRANULOCYTES: 0 %
Immature Grans (Abs): 0 10*3/uL (ref 0.0–0.1)
LYMPHS: 42 %
Lymphocytes Absolute: 2.1 10*3/uL (ref 0.7–3.1)
MCH: 32.1 pg (ref 26.6–33.0)
MCHC: 34.6 g/dL (ref 31.5–35.7)
MCV: 93 fL (ref 79–97)
MONOCYTES: 7 %
MONOS ABS: 0.3 10*3/uL (ref 0.1–0.9)
NEUTROS PCT: 50 %
Neutrophils Absolute: 2.4 10*3/uL (ref 1.4–7.0)
Platelets: 205 10*3/uL (ref 150–450)
RBC: 3.99 x10E6/uL (ref 3.77–5.28)
RDW: 14.5 % (ref 12.3–15.4)
WBC: 4.8 10*3/uL (ref 3.4–10.8)

## 2017-10-21 LAB — THYROID PANEL WITH TSH
Free Thyroxine Index: 2 (ref 1.2–4.9)
T3 Uptake Ratio: 33 % (ref 24–39)
T4 TOTAL: 6.2 ug/dL (ref 4.5–12.0)
TSH: 0.894 u[IU]/mL (ref 0.450–4.500)

## 2017-10-21 LAB — SEDIMENTATION RATE: Sed Rate: 2 mm/hr (ref 0–40)

## 2017-10-21 MED ORDER — AMOXICILLIN-POT CLAVULANATE 875-125 MG PO TABS: 1.0000 | ORAL_TABLET | Freq: Two times a day (BID) | ORAL | 0 refills | Status: DC

## 2017-10-22 LAB — LIPID PANEL
Chol/HDL Ratio: 3.1 ratio (ref 0.0–4.4)
Cholesterol, Total: 228 mg/dL — ABNORMAL HIGH (ref 100–199)
HDL: 73 mg/dL (ref >39)
LDL Calculated: 139 mg/dL — ABNORMAL HIGH (ref 0–99)
TRIGLYCERIDES: 82 mg/dL (ref 0–149)
VLDL Cholesterol Cal: 16 mg/dL (ref 5–40)

## 2017-12-16 DIAGNOSIS — F5105 Insomnia due to other mental disorder: Secondary | ICD-10-CM | POA: Diagnosis not present

## 2017-12-16 DIAGNOSIS — F411 Generalized anxiety disorder: Secondary | ICD-10-CM | POA: Diagnosis not present

## 2017-12-21 DIAGNOSIS — H2513 Age-related nuclear cataract, bilateral: Secondary | ICD-10-CM | POA: Diagnosis not present

## 2017-12-28 ENCOUNTER — Inpatient Hospital Stay
Admission: EM | Admit: 2017-12-28 | Discharge: 2017-12-30 | DRG: 066 | Disposition: A | Discharge status: Home / Self Care | Payer: BLUE CROSS/BLUE SHIELD | Attending: Internal Medicine | Admitting: Internal Medicine

## 2017-12-28 ENCOUNTER — Encounter: Payer: Self-pay | Admitting: Emergency Medicine

## 2017-12-28 ENCOUNTER — Other Ambulatory Visit: Payer: Self-pay

## 2017-12-28 ENCOUNTER — Emergency Department: Payer: BLUE CROSS/BLUE SHIELD

## 2017-12-28 ENCOUNTER — Observation Stay: Payer: BLUE CROSS/BLUE SHIELD

## 2017-12-28 DIAGNOSIS — F329 Major depressive disorder, single episode, unspecified: Secondary | ICD-10-CM | POA: Diagnosis not present

## 2017-12-28 DIAGNOSIS — R4781 Slurred speech: Secondary | ICD-10-CM | POA: Diagnosis not present

## 2017-12-28 DIAGNOSIS — I6523 Occlusion and stenosis of bilateral carotid arteries: Secondary | ICD-10-CM | POA: Diagnosis not present

## 2017-12-28 DIAGNOSIS — R29810 Facial weakness: Secondary | ICD-10-CM | POA: Diagnosis not present

## 2017-12-28 DIAGNOSIS — I639 Cerebral infarction, unspecified: Secondary | ICD-10-CM | POA: Diagnosis not present

## 2017-12-28 DIAGNOSIS — R29700 NIHSS score 0: Secondary | ICD-10-CM | POA: Diagnosis present

## 2017-12-28 DIAGNOSIS — I517 Cardiomegaly: Secondary | ICD-10-CM | POA: Diagnosis not present

## 2017-12-28 DIAGNOSIS — H547 Unspecified visual loss: Secondary | ICD-10-CM | POA: Diagnosis present

## 2017-12-28 DIAGNOSIS — M810 Age-related osteoporosis without current pathological fracture: Secondary | ICD-10-CM | POA: Diagnosis present

## 2017-12-28 DIAGNOSIS — Z803 Family history of malignant neoplasm of breast: Secondary | ICD-10-CM

## 2017-12-28 DIAGNOSIS — Z791 Long term (current) use of non-steroidal anti-inflammatories (NSAID): Secondary | ICD-10-CM

## 2017-12-28 DIAGNOSIS — B001 Herpesviral vesicular dermatitis: Secondary | ICD-10-CM | POA: Diagnosis not present

## 2017-12-28 DIAGNOSIS — Z7983 Long term (current) use of bisphosphonates: Secondary | ICD-10-CM

## 2017-12-28 DIAGNOSIS — Z23 Encounter for immunization: Secondary | ICD-10-CM | POA: Diagnosis not present

## 2017-12-28 DIAGNOSIS — I6389 Other cerebral infarction: Secondary | ICD-10-CM | POA: Diagnosis not present

## 2017-12-28 DIAGNOSIS — Z853 Personal history of malignant neoplasm of breast: Secondary | ICD-10-CM | POA: Diagnosis not present

## 2017-12-28 DIAGNOSIS — Z823 Family history of stroke: Secondary | ICD-10-CM

## 2017-12-28 DIAGNOSIS — Z9013 Acquired absence of bilateral breasts and nipples: Secondary | ICD-10-CM

## 2017-12-28 LAB — COMPREHENSIVE METABOLIC PANEL
ALBUMIN: 4.4 g/dL (ref 3.5–5.0)
ALT: 32 U/L (ref 0–44)
AST: 28 U/L (ref 15–41)
Alkaline Phosphatase: 34 U/L — ABNORMAL LOW (ref 38–126)
Anion gap: 7 (ref 5–15)
BILIRUBIN TOTAL: 0.3 mg/dL (ref 0.3–1.2)
BUN: 25 mg/dL — AB (ref 8–23)
CHLORIDE: 104 mmol/L (ref 98–111)
CO2: 29 mmol/L (ref 22–32)
Calcium: 9.3 mg/dL (ref 8.9–10.3)
Creatinine, Ser: 0.78 mg/dL (ref 0.44–1.00)
GFR calc Af Amer: >60 mL/min (ref >60)
GFR calc non Af Amer: >60 mL/min (ref >60)
Glucose, Bld: 102 mg/dL — ABNORMAL HIGH (ref 70–99)
POTASSIUM: 3.9 mmol/L (ref 3.5–5.1)
SODIUM: 140 mmol/L (ref 135–145)
TOTAL PROTEIN: 6.5 g/dL (ref 6.5–8.1)

## 2017-12-28 LAB — GLUCOSE, CAPILLARY: Glucose-Capillary: 85 mg/dL (ref 70–99)

## 2017-12-28 LAB — CBC
HCT: 35.7 % (ref 35.0–47.0)
HEMOGLOBIN: 12.3 g/dL (ref 12.0–16.0)
MCH: 32.7 pg (ref 26.0–34.0)
MCHC: 34.5 g/dL (ref 32.0–36.0)
MCV: 95 fL (ref 80.0–100.0)
PLATELETS: 164 10*3/uL (ref 150–440)
RBC: 3.76 MIL/uL — ABNORMAL LOW (ref 3.80–5.20)
RDW: 13.3 % (ref 11.5–14.5)
WBC: 4.8 10*3/uL (ref 3.6–11.0)

## 2017-12-28 LAB — DIFFERENTIAL
BASOS ABS: 0 10*3/uL (ref 0–0.1)
BASOS PCT: 1 %
Eosinophils Absolute: 0.1 10*3/uL (ref 0–0.7)
Eosinophils Relative: 2 %
LYMPHS ABS: 1.9 10*3/uL (ref 1.0–3.6)
Lymphocytes Relative: 39 %
Monocytes Absolute: 0.3 10*3/uL (ref 0.2–0.9)
Monocytes Relative: 7 %
NEUTROS PCT: 51 %
Neutro Abs: 2.5 10*3/uL (ref 1.4–6.5)

## 2017-12-28 LAB — PROTIME-INR
INR: 1.11
PROTHROMBIN TIME: 14.2 s (ref 11.4–15.2)

## 2017-12-28 LAB — TROPONIN I

## 2017-12-28 LAB — APTT: APTT: 29 s (ref 24–36)

## 2017-12-28 MED ORDER — ASPIRIN 81 MG PO CHEW
324.0000 mg | CHEWABLE_TABLET | Freq: Once | ORAL | Status: AC
  Administered 2017-12-28: 324 mg via ORAL
  Filled 2017-12-28: qty 4

## 2017-12-28 MED ORDER — ACETAMINOPHEN 325 MG PO TABS
ORAL_TABLET | ORAL | Status: AC
  Filled 2017-12-28: qty 2

## 2017-12-28 MED ORDER — ACETAMINOPHEN 160 MG/5ML PO SOLN
650.0000 mg | ORAL | Status: DC | PRN
  Filled 2017-12-28: qty 20.3

## 2017-12-28 MED ORDER — SENNOSIDES-DOCUSATE SODIUM 8.6-50 MG PO TABS
1.0000 | ORAL_TABLET | Freq: Every day | ORAL | Status: DC
  Filled 2017-12-28: qty 1

## 2017-12-28 MED ORDER — LORAZEPAM 2 MG/ML IJ SOLN
2.0000 mg | Freq: Once | INTRAMUSCULAR | Status: AC
  Administered 2017-12-28: 1 mg via INTRAVENOUS
  Filled 2017-12-28: qty 1

## 2017-12-28 MED ORDER — MELOXICAM 7.5 MG PO TABS
7.5000 mg | ORAL_TABLET | Freq: Every day | ORAL | Status: DC
  Administered 2017-12-29 – 2017-12-30 (×2): 7.5 mg via ORAL
  Filled 2017-12-28 (×2): qty 1

## 2017-12-28 MED ORDER — SODIUM CHLORIDE 0.9 % IV SOLN
INTRAVENOUS | Status: DC
  Administered 2017-12-28 – 2017-12-30 (×4): via INTRAVENOUS

## 2017-12-28 MED ORDER — ENOXAPARIN SODIUM 40 MG/0.4ML ~~LOC~~ SOLN
40.0000 mg | SUBCUTANEOUS | Status: DC
  Administered 2017-12-28 – 2017-12-29 (×2): 40 mg via SUBCUTANEOUS
  Filled 2017-12-28 (×2): qty 0.4

## 2017-12-28 MED ORDER — SERTRALINE HCL 50 MG PO TABS
50.0000 mg | ORAL_TABLET | Freq: Every day | ORAL | Status: DC
  Administered 2017-12-29 – 2017-12-30 (×2): 50 mg via ORAL
  Filled 2017-12-28 (×2): qty 1

## 2017-12-28 MED ORDER — ACYCLOVIR 200 MG PO CAPS
400.0000 mg | ORAL_CAPSULE | Freq: Two times a day (BID) | ORAL | Status: DC
  Filled 2017-12-28 (×5): qty 2

## 2017-12-28 MED ORDER — ADULT MULTIVITAMIN W/MINERALS CH
1.0000 | ORAL_TABLET | Freq: Every day | ORAL | Status: DC
  Administered 2017-12-29 – 2017-12-30 (×2): 1 via ORAL
  Filled 2017-12-28 (×2): qty 1

## 2017-12-28 MED ORDER — ASPIRIN 300 MG RE SUPP: 300.0000 mg | Freq: Every day | RECTAL | Status: DC

## 2017-12-28 MED ORDER — ACETAMINOPHEN 650 MG RE SUPP: 650.0000 mg | RECTAL | Status: DC | PRN

## 2017-12-28 MED ORDER — ACETAMINOPHEN 325 MG PO TABS
650.0000 mg | ORAL_TABLET | ORAL | Status: DC | PRN
  Administered 2017-12-28 – 2017-12-30 (×4): 650 mg via ORAL
  Filled 2017-12-28 (×3): qty 2

## 2017-12-28 MED ORDER — INFLUENZA VAC SPLIT QUAD 0.5 ML IM SUSY
0.5000 mL | PREFILLED_SYRINGE | INTRAMUSCULAR | Status: AC
  Administered 2017-12-29: 0.5 mL via INTRAMUSCULAR
  Filled 2017-12-28: qty 0.5

## 2017-12-28 MED ORDER — ASPIRIN EC 325 MG PO TBEC
325.0000 mg | DELAYED_RELEASE_TABLET | Freq: Every day | ORAL | Status: DC
  Administered 2017-12-29 – 2017-12-30 (×2): 325 mg via ORAL
  Filled 2017-12-28 (×3): qty 1

## 2017-12-28 MED ORDER — STROKE: EARLY STAGES OF RECOVERY BOOK
Freq: Once | Status: AC
  Administered 2017-12-28: 20:00:00

## 2017-12-28 NOTE — ED Provider Notes (Signed)
[facility] Emergency Department Provider Note  ____________________________________________   First MD Initiated Contact with Patient 12/28/17 1504     (approximate)  I have reviewed the triage vital signs and the nursing notes.   HISTORY  Chief Complaint Aphasia  HPI Amanda Osborn is a 62 y.o. female with a history of osteoporosis who is presenting to the emergency department today with slurred speech, loss of vision as well as right-sided facial droop.  She says that the symptoms started at work.  Says that she lost her vision for "a split second."  However, she states that she did not lose consciousness or feel lightheaded.  She says that she then had slurred speech and word finding difficulty and a right-sided facial droop that was noted by a coworker.  She says that the slurred speech and the other symptoms have now resolved but she is still having word finding difficulty.  Denies any weakness or numbness.  States that she has a history of cerebellar strokes that were previously found on MRI but is not on any blood thinners.  Says that she has a family history of strokes.  No history of smoking, drinking or drug use.   Past Medical History:  Diagnosis Date  . Allergy   . Cancer ([place])   . Osteoporosis     Patient Active Problem List   Diagnosis Date Noted  . Malignant neoplastic disease ([place]) 03/18/2015  . Chondromalacia of patella 09/13/2014  . Bursitis 09/13/2014  . Allergic rhinitis 09/13/2014  . Cancer ([place]) 09/13/2014  . Cervical disc disease 09/13/2014    Past Surgical History:  Procedure Laterality Date  . AUGMENTATION MAMMAPLASTY Bilateral 2011  . BREAST CYST ASPIRATION Bilateral   . BREAST EXCISIONAL BIOPSY Left 2006   breast ca  . BREAST LUMPECTOMY    . CARDIAC CATHETERIZATION    . COLONOSCOPY    . COLONOSCOPY WITH PROPOFOL N/A 06/28/2016   Procedure: COLONOSCOPY WITH PROPOFOL;  Surgeon: Lollie Sails, MD;  Location: [facility]  ENDOSCOPY;  Service: Endoscopy;  Laterality: N/A;  . MASTECTOMY Bilateral   . VAGINAL HYSTERECTOMY      Prior to Admission medications   Medication Sig Start Date End Date Taking? Authorizing Provider  acyclovir (ZOVIRAX) 400 MG tablet Take 400 mg by mouth 2 (two) times daily. 12/16/17  Yes [provider]  ibandronate (BONIVA) 150 MG tablet Take 1 tablet by mouth every 30 (thirty) days. 11/28/13  Yes [provider]  meloxicam (MOBIC) 7.5 MG tablet Take 7.5 mg daily by mouth. Reche Dixon 01/11/17  Yes [provider]  sertraline (ZOLOFT) 50 MG tablet Take 50 mg by mouth daily. 12/16/17  Yes [provider]  traZODone (DESYREL) 50 MG tablet Take 50-100 mg by mouth at bedtime.  02/03/17  Yes [provider]  amoxicillin-clavulanate (AUGMENTIN) 875-125 MG tablet Take 1 tablet by mouth 2 (two) times daily. Patient not taking: Reported on 12/28/2017 10/21/17   Juline Patch, MD  Multiple Vitamin (MULTI-VITAMINS) TABS Take 1 tablet by mouth daily.    [provider]    Allergies Montelukast sodium  Family History  Problem Relation Age of Onset  . Breast cancer Paternal Aunt 7  . Breast cancer Paternal Grandmother 42    Social History Social History   Tobacco Use  . Smoking status: Never Smoker  . Smokeless tobacco: Never Used  Substance Use Topics  . Alcohol use: No    Alcohol/week: 0.0 standard drinks  . Drug use:  No    Review of Systems  Constitutional: No fever/chills Eyes: No visual changes. ENT: No sore throat. Cardiovascular: Denies chest pain. Respiratory: Denies shortness of breath. Gastrointestinal: No abdominal pain.  No nausea, no vomiting.  No diarrhea.  No constipation. Genitourinary: Negative for dysuria. Musculoskeletal: Negative for back pain. Skin: Negative for rash. Neurological: Negative for headaches, focal weakness or numbness.   ____________________________________________   PHYSICAL EXAM:  VITAL  SIGNS: ED Triage Vitals  Enc Vitals Group     BP 12/28/17 1508 (!) 143/81     Pulse Rate 12/28/17 1508 77     Resp --      Temp 12/28/17 1508 98.3 F (36.8 C)     Temp Source 12/28/17 1508 Oral     SpO2 12/28/17 1508 100 %     Weight 12/28/17 1509 104 lb (47.2 kg)     Height --      Head Circumference --      Peak Flow --      Pain Score 12/28/17 1509 0     Pain Loc --      Pain Edu? --      Excl. in [place]? --     Constitutional: Alert and oriented. Well appearing and in no acute distress. Eyes: Conjunctivae are normal.  Head: Atraumatic. Nose: No congestion/rhinnorhea. Mouth/Throat: Mucous membranes are moist.  Neck: No stridor.   Cardiovascular: Normal rate, regular rhythm. Grossly normal heart sounds.  Respiratory: Normal respiratory effort.  No retractions. Lungs CTAB. Gastrointestinal: Soft and nontender. No distention.  Musculoskeletal: No lower extremity tenderness nor edema.  No joint effusions. Neurologic:  Normal speech and language. No gross focal neurologic deficits are appreciated. Skin:  Skin is warm, dry and intact. No rash noted. Psychiatric: Mood and affect are normal. Speech and behavior are normal.  NIH Stroke Scale   Person Administering Scale: Doran Stabler  Administer stroke scale items in the order listed. Record performance in each category after each subscale exam. Do not go back and change scores. Follow directions provided for each exam technique. Scores should reflect what the patient does, not what the clinician thinks the patient can do. The clinician should record answers while administering the exam and work quickly. Except where indicated, the patient should not be coached (i.e., repeated requests to patient to make a special effort).   1a  Level of consciousness: 0=alert; keenly responsive  1b. LOC questions:  0=Performs both tasks correctly  1c. LOC commands: 0=Performs both tasks correctly  2.  Best Gaze: 0=normal  3.  Visual: 0=No  visual loss  4. Facial Palsy: 0=Normal symmetric movement  5a.  Motor left arm: 0=No drift, limb holds 90 (or 45) degrees for full 10 seconds  5b.  Motor right arm: 0=No drift, limb holds 90 (or 45) degrees for full 10 seconds  6a. motor left leg: 0=No drift, limb holds 90 (or 45) degrees for full 10 seconds  6b  Motor right leg:  0=No drift, limb holds 90 (or 45) degrees for full 10 seconds  7. Limb Ataxia: 0=Absent  8.  Sensory: 0=Normal; no sensory loss  9. Best Language:  0=No aphasia, normal  10. Dysarthria: 0=Normal  11. Extinction and Inattention: 0=No abnormality  12. Distal motor function: 0=Normal   Total:   0   ____________________________________________   LABS (all labs ordered are listed, but only abnormal results are displayed)  Labs Reviewed  CBC - Abnormal; Notable for the following components:  Result Value   RBC 3.76 (*)    All other components within normal limits  GLUCOSE, CAPILLARY  DIFFERENTIAL  PROTIME-INR  APTT  COMPREHENSIVE METABOLIC PANEL  TROPONIN I  CBG MONITORING, ED   ____________________________________________  EKG  ED ECG REPORT I, Doran Stabler, the attending physician, personally viewed and interpreted this ECG.   Date: 12/28/2017  EKG Time: 1509  Rate: 76  Rhythm: normal sinus rhythm  Axis: Normal  Intervals:left anterior fascicular block  ST&T Change: No ST segment elevation or depression.  T wave inversions in aVL.  ____________________________________________  RADIOLOGY  No acute finding on the CT of the head.  Small chronic cerebellar infarcts. ____________________________________________   PROCEDURES  Procedure(s) performed:   Procedures  Critical Care performed:   ____________________________________________   INITIAL IMPRESSION / ASSESSMENT AND PLAN / ED COURSE  Pertinent labs & imaging results that were available during my care of the patient were reviewed by me and considered in my medical  decision making (see chart for details).  DDX: CVA, ACS, less light abnormality, UTI As part of my medical decision making, I reviewed the following data within the electronic MEDICAL RECORD NUMBER Notes from prior outpatient visits  ----------------------------------------- 3:59 PM on 12/28/2017 -----------------------------------------  Patient was made a code stroke and was evaluated by the neurologist, Dr. Stark Klein, who recommends admission to the hospital but not a TPA candidate at this time.  NIH score of 0 at this time.  Patient understands need for admission and willing to comply.  Signed out to Dr. Vella Kohler.   ____________________________________________   FINAL CLINICAL IMPRESSION(S) / ED DIAGNOSES  CVA  NEW MEDICATIONS STARTED DURING THIS VISIT:  New Prescriptions   No medications on file     Note:  This document was prepared using Dragon voice recognition software and may include unintentional dictation errors.     Orbie Pyo, MD 12/28/17 1600

## 2017-12-28 NOTE — Code Documentation (Signed)
Pt arrives via EMS from work, states at 1330 she was at work bent over with a plant when she suddenly developed slurred speech and a right facial droop, code stroke activated upon arrival, Dr. Lucita Lora cleared pt at 1505, after non con head CT pt taken to room 3, husband at bedside, [place] neuro NP at bedside, initial NIHSS 0, pt states speech is better but not at baseline, no tPA given due to NIHSS 0 with resolving symptoms, report off to [employer]

## 2017-12-28 NOTE — Progress Notes (Signed)
   12/28/17 1541  Clinical Encounter Type  Visited With Patient;Family (Husband Dalphine Handing)  Visit Type Initial;Code (Code Stroke)  Referral From Physician  Recommendations Follow-up as requested.  Spiritual Encounters  Spiritual Needs Emotional  Stress Factors  Patient Stress Factors Health changes   Chaplain responded to Code Stroke at 15:17 and met the patient's husband, Cabella Kimm. Mr. Graves did want to contact anyone until given permission from the patient. He was not experiencing distress.The patient returned from getting a CT scan and was alert and communicative. Chaplain provided pastoral presence and energetic prayer. [name] indicated that all was well. Chaplain asked the unit secretary to page if the family or staff could benefit from another pastoral visit.

## 2017-12-28 NOTE — ED Triage Notes (Addendum)
Pt ems from work for slurred speech that started approx. 1330 today and lasted 20 min. Pt states that her speech and jaw still feels funny. NIH for ems and here is zero.

## 2017-12-28 NOTE — ED Notes (Signed)
Pt transported to room 132 

## 2017-12-28 NOTE — Consult Note (Signed)
Referring Physician: Epifanio Lesches, MD    Chief Complaint: Slurred speech and right facial droop  HPI: Amanda Osborn is an 62 y.o. female with pertinent history of old cerebellar strokes, breast cancer, osteoporosis presenting to the ED with sudden onset slurred speech, loss of vision, as well as right sided facial droop.  Patient states that she was at work around 1330 when symptoms began, she states that she bent over with a plant when she suddenly developed slurred speech and right facial droop.  She recalled that she lost her vision for a split second without loss of consciousness.  She denies other associated symptoms of sudden onset headache, dizziness, gait disturbance, numbness or heavy feeling in any of her extremities, or other focal neurologic deficit.  Patient states that prior to this event she was not taking anticoagulant or antiplatelet.  On arrival to the ED, her symptoms were rapidly improving.  She states that she still has some word finding difficulty and mild slurred speech.  Husband who is currently at the bedside agrees that her speech is mildly slurred.  She is however able to converse well without any difficulty or discernible slurring of speech.  She had initial CT of the head which showed no acute intracranial abnormality.  Was evaluated by a tele-neurologist who deemed her not a TPA candidate as exam is nonfocal and not IR candidate as exam is nonfocal and not correlating with large vessel occlusion clinically.  Initial NIH stroke scale 0  Date last known well: Date: 12/28/2017 Time last known well: Time: 13:30 tPA Given: [id number] in symptoms  Past Medical History:  Diagnosis Date  . Allergy   . Cancer ([place])   . Osteoporosis     Past Surgical History:  Procedure Laterality Date  . AUGMENTATION MAMMAPLASTY Bilateral 2011  . BREAST CYST ASPIRATION Bilateral   . BREAST EXCISIONAL BIOPSY Left 2006   breast ca  . BREAST LUMPECTOMY    . CARDIAC  CATHETERIZATION    . COLONOSCOPY    . COLONOSCOPY WITH PROPOFOL N/A 06/28/2016   Procedure: COLONOSCOPY WITH PROPOFOL;  Surgeon: Lollie Sails, MD;  Location: [facility] ENDOSCOPY;  Service: Endoscopy;  Laterality: N/A;  . MASTECTOMY Bilateral   . VAGINAL HYSTERECTOMY      Family History  Problem Relation Age of Onset  . Breast cancer Paternal Aunt 33  . Breast cancer Paternal Grandmother 73   Social History:  reports that she has never smoked. She has never used smokeless tobacco. She reports that she does not drink alcohol or use drugs.  Allergies:  Allergies  Allergen Reactions  . Montelukast Sodium     Medications:  I have reviewed the patient's current medications. Prior to Admission:  (Not in a hospital admission) Scheduled: .  stroke: mapping our early stages of recovery book   Does not apply Once  . acyclovir  400 mg Oral BID  . aspirin  300 mg Rectal Daily   Or  . aspirin  325 mg Oral Daily  . enoxaparin (LOVENOX) injection  40 mg Subcutaneous Q24H  . meloxicam  7.5 mg Oral Daily  . MULTI-VITAMINS  1 tablet Oral Daily  . senna-docusate  1 tablet Oral QHS  . sertraline  50 mg Oral Daily    ROS: History obtained from the patient   General ROS: negative for - chills, fatigue, fever, night sweats, weight gain or weight loss Psychological ROS: negative for - behavioral disorder, hallucinations, memory difficulties, mood swings or suicidal ideation Ophthalmic ROS:  negative for - blurry vision, double vision, eye pain or loss of vision ENT ROS: negative for - epistaxis, nasal discharge, oral lesions, sore throat, tinnitus or vertigo Allergy and Immunology ROS: negative for - hives or itchy/watery eyes Hematological and Lymphatic ROS: negative for - bleeding problems, bruising or swollen lymph nodes Endocrine ROS: negative for - galactorrhea, hair pattern changes, polydipsia/polyuria or temperature intolerance Respiratory ROS: negative for - cough, hemoptysis, shortness  of breath or wheezing Cardiovascular ROS: negative for - chest pain, dyspnea on exertion, edema or irregular heartbeat Gastrointestinal ROS: negative for - abdominal pain, diarrhea, hematemesis, nausea/vomiting or stool incontinence Genito-Urinary ROS: negative for - dysuria, hematuria, incontinence or urinary frequency/urgency Musculoskeletal ROS: negative for - joint swelling or muscular weakness Neurological ROS: as noted in HPI Dermatological ROS: negative for rash and skin lesion changes  Physical Exam   Vitals Blood pressure 125/89, pulse 88, temperature 98.3 F (36.8 C), resp. rate 16, weight 47.4 kg, SpO2 100 %.   HEENT-  Normocephalic, no lesions, without obvious abnormality.  Normal external eye and conjunctiva.  Normal TM's bilaterally.  Normal auditory canals and external ears. Normal external nose, mucus membranes and septum.  Normal pharynx. Cardiovascular- S1, S2 normal, pulses palpable throughout   Lungs- chest clear, no wheezing, rales, normal symmetric air entry Abdomen- soft, non-tender; bowel sounds normal; no masses,  no organomegaly Extremities- no edema Lymph-no adenopathy palpable Musculoskeletal-no joint tenderness, deformity or swelling Skin-warm and dry, no hyperpigmentation, vitiligo, or suspicious lesions  Neurological Exam   Mental Status: Alert, oriented, thought content appropriate.  Speech fluent without evidence of aphasia.  Able to follow 3 step commands without difficulty. Attention span and concentration seemed appropriate  Cranial Nerves: II: Discs flat bilaterally; Visual fields grossly normal, pupils equal, round, reactive to light and accommodation III,IV, VI: ptosis not present, extra-ocular motions intact bilaterally V,VII: smile symmetric, facial light touch sensation intact VIII: hearing normal bilaterally IX,X: gag reflex deferred XI: bilateral shoulder shrug XII: midline tongue extension Motor: Right :  Upper extremity   5/5 Without  pronator drift      Left: Upper extremity   5/5 without pronator drift Right:   Lower extremity   5/5                                          Left: Lower extremity   5/5 Tone and bulk:normal tone throughout; no atrophy noted Sensory: Pinprick and light touch intact on the right Deep Tendon Reflexes: 2+ and symmetric throughout Plantars: Right: mute                              Left: mute Cerebellar: Finger-to-nose testing intact bilaterally. Heel to shin testing normal bilaterally Gait: Not tested due to safety  Data Reviewed  Laboratory Studies:  Basic Metabolic Panel: Recent Labs  Lab 12/28/17 1505  NA 140  K 3.9  CL 104  CO2 29  GLUCOSE 102*  BUN 25*  CREATININE 0.78  CALCIUM 9.3    Liver Function Tests: Recent Labs  Lab 12/28/17 1505  AST 28  ALT 32  ALKPHOS 34*  BILITOT 0.3  PROT 6.5  ALBUMIN 4.4   No results for input(s): LIPASE, AMYLASE in the last 168 hours. No results for input(s): AMMONIA in the last 168 hours.  CBC: Recent Labs  Lab 12/28/17 1505  WBC 4.8  NEUTROABS 2.5  HGB 12.3  HCT 35.7  MCV 95.0  PLT 164    Cardiac Enzymes: Recent Labs  Lab 12/28/17 1505  TROPONINI <0.03    BNP: Invalid input(s): POCBNP  CBG: Recent Labs  Lab 12/28/17 1504  GLUCAP 85    Microbiology: No results found for this or any previous visit.  Coagulation Studies: Recent Labs    12/28/17 1505  LABPROT 14.2  INR 1.11    Urinalysis: No results for input(s): COLORURINE, LABSPEC, PHURINE, GLUCOSEU, HGBUR, BILIRUBINUR, KETONESUR, PROTEINUR, UROBILINOGEN, NITRITE, LEUKOCYTESUR in the last 168 hours.  Invalid input(s): APPERANCEUR  Lipid Panel:    Component Value Date/Time   CHOL 228 (H) 10/21/2017 1457   TRIG 82 10/21/2017 1457   HDL 73 10/21/2017 1457   CHOLHDL 3.1 10/21/2017 1457   LDLCALC 139 (H) 10/21/2017 1457    HgbA1C: No results found for: HGBA1C  Urine Drug Screen:  No results found for: LABOPIA, COCAINSCRNUR, LABBENZ, AMPHETMU,  THCU, LABBARB  Alcohol Level: No results for input(s): ETH in the last 168 hours.  Other results: EKG:  Vent. rate 76 BPM sinus rythm PR interval * ms QRS duration 95 ms QT/QTc 376/423 ms P-R-T axes 77 -64 81  Imaging: Ct Head Code Stroke Wo Contrast  Addendum Date: 12/28/2017   ADDENDUM REPORT: 12/28/2017 15:44 ADDENDUM: Study discussed by telephone with Dr. Larae Grooms on 12/28/2017 at 1532 hours. Electronically Signed   By: Genevie Ann M.D.   On: 12/28/2017 15:44   Result Date: 12/28/2017 CLINICAL DATA:  Code stroke. 62 year old female with slurred speech beginning at 1330 hours today. EXAM: CT HEAD WITHOUT CONTRAST TECHNIQUE: Contiguous axial images were obtained from the base of the skull through the vertex without intravenous contrast. COMPARISON:  Brain MRI 05/07/2016 and earlier. FINDINGS: Brain: Stable cerebral volume since the prior MRI. Occasional small chronic infarcts in the cerebellum. Stable gray-white matter differentiation throughout the brain. No midline shift, ventriculomegaly, mass effect, evidence of mass lesion, intracranial hemorrhage or evidence of cortically based acute infarction. Vascular: Mild Calcified atherosclerosis at the skull base. No suspicious intracranial vascular hyperdensity. Skull: Negative. Sinuses/Orbits: Visualized paranasal sinuses and mastoids are stable and well pneumatized. Other: Visualized orbits and scalp soft tissues are within normal limits. ASPECTS [facility] Stroke Program Early CT Score) - Ganglionic level infarction (caudate, lentiform nuclei, internal capsule, insula, M1-M3 cortex): 7 - Supraganglionic infarction (M4-M6 cortex): 3 Total score (0-10 with 10 being normal): 10 IMPRESSION: 1. No acute cortically based infarct or intracranial hemorrhage. 2. Small chronic cerebellar infarcts. 3. ASPECTS is 10. Electronically Signed: By: Genevie Ann M.D. On: 12/28/2017 15:29    Patient seen and examined.  Clinical course and management discussed.   Necessary edits performed.  I agree with the above.  Assessment and plan of care developed and discussed below.    Assessment: 62 y.o. female with history of old cerebellar infarcts, breast cancer, osteoporosis presenting  with sudden onset slurred speech, brief loss of vision, and right sided facial droop.  MRI brain which was personally reviewed showed evidence of acute infarct in the peripheral aspect of the right precentral gyrus.  Etiology likely embolic.  MRA unremarkable.  LDL 139.  She states she was not on any antiplatelet or anticoagulant prior to this event.  Stroke Risk Factors - hyperlipidemia  Plan: 1. HgbA1c pending 2. Aspirin 81 mg/day 3. PT consult, OT consult, Speech consult 4. Echocardiogram pending if unremarkable patient to have TEE 5. Carotid dopplers pending 6. Statin for  lipid management with target LDL<70. 7. NPO until RN stroke swallow screen 8. Telemetry monitoring 9. Frequent neuro checks   This patient was staffed with Dr. Magda Paganini, Doy Mince who personally evaluated patient, reviewed documentation and agreed with assessment and plan of care as above.  Rufina Falco, DNP, FNP-BC Board certified Nurse Practitioner Neurology Department   12/28/2017, 4:26 PM  Alexis Goodell, MD Neurology [phone]  12/29/2017  2:14 PM

## 2017-12-28 NOTE — Consult Note (Signed)
   [facility] TeleNeurology Consult Services    Date of Service:   12/28/2017 15:23:20  Impression:      .  RO Acute Ischemic Stroke     .  TIA  Comments: not iv tpa candidate as exam is non focal and not IR candidate as exam is non focal and not correlating with LVO clinically.  Metrics: Last Known Well: 12/28/2017 13:30:00 [facility] Notification Time: 12/28/2017 15:22:11 Arrival Time: 12/28/2017 14:51:00 Stamp Time: 12/28/2017 15:23:20 Time First Login Attempt: 12/28/2017 15:30:00 Video Start Time: 12/28/2017 15:30:00  Symptoms: slurred speech NIHSS Start Assessment Time: 12/28/2017 15:32:00 Patient is not a candidate for tPA. Patient was not deemed candidate for tPA thrombolytics because of non focal examination.. Video End Time: 12/28/2017 15:35:00  CT head showed no acute hemorrhage or acute core infarct. CT head was reviewed.  Advanced imaging was not obtained as the presentation was not suggestive of Large Vessel Occlusive Disease.  ER physician notified of the decision on thrombolytics management.  Our recommendations are outlined below.  Recommendations:     .  Antiplatelet Therapy Recommended     .  MRI brain, ECHO, NIVS carotid, lipid panel, telemetry  Routine Consultation with [facility] for Follow up Care  Sign Out:     .  Discussed with Emergency Department Provider    ------------------------------------------------------------------------------  History of Present Illness:  Patient is a 62 years old Female.  Patient was brought by EMS for symptoms of slurred speech  Patient presents to ED from work with abnl jaw sensation and slurred speech. She states while at work she bent down to get something and when she stood up she felt pain in her jaw, altered sensation b/l, slurred speech, and drooping on R side. Sxs have resolved in ED at this time and ct scan head in ED was notable for old cerebellar infarcts.  CT head showed no  acute hemorrhage or acute core infarct. CT head was reviewed.    Examination:  1A: Level of Consciousness - Alert; keenly responsive + 0 1B: Ask Month and Age - Both Questions Right + 0 1C: Blink Eyes & Squeeze Hands - Performs Both Tasks + 0 2: Test Horizontal Extraocular Movements - Normal + 0 3: Test Visual Fields - No Visual Loss + 0 4: Test Facial Palsy (Use Grimace if Obtunded) - Normal symmetry + 0 5A: Test Left Arm Motor Drift - No Drift for 10 Seconds + 0 5B: Test Right Arm Motor Drift - No Drift for 10 Seconds + 0 6A: Test Left Leg Motor Drift - No Drift for 5 Seconds + 0 6B: Test Right Leg Motor Drift - No Drift for 5 Seconds + 0 7: Test Limb Ataxia (FNF/Heel-Shin) - No Ataxia + 0 8: Test Sensation - Normal; No sensory loss + 0 9: Test Language/Aphasia - Normal; No aphasia + 0 10: Test Dysarthria - Normal + 0 11: Test Extinction/Inattention - No abnormality + 0  NIHSS Score: 0  Patient was informed the Neurology Consult would happen via TeleHealth consult by way of interactive audio and video telecommunications and consented to receiving care in this manner.  Due to the immediate potential for life-threatening deterioration due to underlying acute neurologic illness, I spent 35 minutes providing critical care. This time includes time for face to face visit via telemedicine, review of medical records, imaging studies and discussion of findings with providers, the patient and/or family.   Dr Sylvan Cheese   [facility] [phone]

## 2017-12-28 NOTE — Progress Notes (Signed)
CODE STROKE- PHARMACY COMMUNICATION   Time CODE STROKE called/page received:15:14   Time response to CODE STROKE was made (in person or via phone):15:16   Time Stroke Kit retrieved from [facility] (only if needed): NA  Name of Provider/Nurse contacted:Alecia  Past Medical History:  Diagnosis Date  . Allergy   . Cancer ([facility])   . Osteoporosis    Prior to Admission medications   Medication Sig Start Date End Date Taking? Authorizing Provider  acyclovir (ZOVIRAX) 400 MG tablet Take 400 mg by mouth 2 (two) times daily. 12/16/17  Yes [provider]  ibandronate (BONIVA) 150 MG tablet Take 1 tablet by mouth every 30 (thirty) days. 11/28/13  Yes [provider]  meloxicam (MOBIC) 7.5 MG tablet Take 7.5 mg daily by mouth. Todd Mundy 01/11/17  Yes [provider]  sertraline (ZOLOFT) 50 MG tablet Take 50 mg by mouth daily. 12/16/17  Yes [provider]  traZODone (DESYREL) 50 MG tablet Take 50-100 mg by mouth at bedtime.  02/03/17  Yes [provider]  amoxicillin-clavulanate (AUGMENTIN) 875-125 MG tablet Take 1 tablet by mouth 2 (two) times daily. Patient not taking: Reported on 12/28/2017 10/21/17   Jones, Deanna C, MD  Multiple Vitamin (MULTI-VITAMINS) TABS Take 1 tablet by mouth daily.    [provider]  traMADol (ULTRAM) 50 MG tablet Take 1 tablet (50 mg total) by mouth every 6 (six) hours as needed. 07/14/16 12/28/17  Jones, Deanna C, MD  venlafaxine XR (EFFEXOR-XR) 150 MG 24 hr capsule Take 150 mg by mouth daily with breakfast. Dr.kapur  12/28/17  [provider]    Nathan A Cookson ,PharmD Clinical Pharmacist  12/28/2017  3:31 PM   

## 2017-12-28 NOTE — H&P (Signed)
[facility] [place] NAME: Amanda Osborn    MR#:  [id number]  DATE OF BIRTH:  22-Jan-1956  DATE OF ADMISSION:  12/28/2017  PRIMARY CARE PHYSICIAN: Juline Patch, MD   REQUESTING/REFERRING PHYSICIAN: Dr. Clearnce Hasten  CHIEF COMPLAINT: Slurred speech   Chief Complaint  Patient presents with  . Aphasia    HISTORY OF PRESENT ILLNESS:  Amanda Osborn  is a 62 y.o. female with a known history of allergies, hyperlipidemia but not on any medicines currently noticed slurred speech around 1.30pm this afternoon.  Patient coworkers also noticed she had right facial droop.  All the symptoms were resolved within seconds.  Patient denied any loss of consciousness or blurred vision.  No weakness of hands or legs.  Patient slurred speech resolved but has some trouble with word finding for some time.  Did not have any numbness of the face, h upper and lower extremities.  Telemetry neurologist recommended admission, stroke work-up.  Did not recommend TPA because of nonfocal exam. initial NIH stroke scale is 0. PAST MEDICAL HISTORY:   Past Medical History:  Diagnosis Date  . Allergy   . Cancer ([place])   . Osteoporosis     PAST SURGICAL HISTOIRY:   Past Surgical History:  Procedure Laterality Date  . AUGMENTATION MAMMAPLASTY Bilateral 2011  . BREAST CYST ASPIRATION Bilateral   . BREAST EXCISIONAL BIOPSY Left 2006   breast ca  . BREAST LUMPECTOMY    . CARDIAC CATHETERIZATION    . COLONOSCOPY    . COLONOSCOPY WITH PROPOFOL N/A 06/28/2016   Procedure: COLONOSCOPY WITH PROPOFOL;  Surgeon: Lollie Sails, MD;  Location: [facility] ENDOSCOPY;  Service: Endoscopy;  Laterality: N/A;  . MASTECTOMY Bilateral   . VAGINAL HYSTERECTOMY      SOCIAL HISTORY:   Social History   Tobacco Use  . Smoking status: Never Smoker  . Smokeless tobacco: Never Used  Substance Use Topics  . Alcohol use: No    Alcohol/week: 0.0 standard drinks    FAMILY HISTORY:    Family History  Problem Relation Age of Onset  . Breast cancer Paternal Aunt 55  . Breast cancer Paternal Grandmother 19    DRUG ALLERGIES:   Allergies  Allergen Reactions  . Montelukast Sodium     REVIEW OF SYSTEMS:  CONSTITUTIONAL: No fever, fatigue or weakness.  EYES: No blurred or double vision.  EARS, NOSE, AND THROAT: No tinnitus or ear pain.  RESPIRATORY: No cough, shortness of breath, wheezing or hemoptysis.  CARDIOVASCULAR: No chest pain, orthopnea, edema.  GASTROINTESTINAL: No nausea, vomiting, diarrhea or abdominal pain.  GENITOURINARY: No dysuria, hematuria.  ENDOCRINE: No polyuria, nocturia,  HEMATOLOGY: No anemia, easy bruising or bleeding SKIN: No rash or lesion. MUSCULOSKELETAL: No joint pain or arthritis.   NEUROLOGIC: No tingling, numbness, weakness.  Patient had slurred speech this afternoon. PSYCHIATRY: No anxiety or depression.   MEDICATIONS AT HOME:   Prior to Admission medications   Medication Sig Start Date End Date Taking? Authorizing Provider  acyclovir (ZOVIRAX) 400 MG tablet Take 400 mg by mouth 2 (two) times daily. 12/16/17  Yes [provider]  ibandronate (BONIVA) 150 MG tablet Take 1 tablet by mouth every 30 (thirty) days. 11/28/13  Yes [provider]  meloxicam (MOBIC) 7.5 MG tablet Take 7.5 mg daily by mouth. Reche Dixon 01/11/17  Yes [provider]  Multiple Vitamin (MULTI-VITAMINS) TABS Take 1 tablet by mouth daily.   Yes [provider]  sertraline (ZOLOFT)  50 MG tablet Take 50 mg by mouth daily.  12/16/17  Yes [provider]  traZODone (DESYREL) 50 MG tablet Take 50-100 mg by mouth at bedtime.  02/03/17  Yes [provider]  amoxicillin-clavulanate (AUGMENTIN) 875-125 MG tablet Take 1 tablet by mouth 2 (two) times daily. Patient not taking: Reported on 12/28/2017 10/21/17   Juline Patch, MD      VITAL SIGNS:  Blood pressure 125/89, pulse 88, temperature 98.3 F (36.8 C), resp.  rate 16, weight 47.4 kg, SpO2 100 %.  PHYSICAL EXAMINATION:  GENERAL:  62 y.o.-year-old patient lying in the bed with no acute distress.,  No facial droop or slurred speech observed.  Now. EYES: Pupils equal, round, reactive to light and accommodation. No scleral icterus. Extraocular muscles intact.  HEENT: Head atraumatic, normocephalic. Oropharynx and nasopharynx clear.  NECK:  Supple, no jugular venous distention. No thyroid enlargement, no tenderness.  LUNGS: Normal breath sounds bilaterally, no wheezing, rales,rhonchi or crepitation. No use of accessory muscles of respiration.  CARDIOVASCULAR: S1, S2 normal. No murmurs, rubs, or gallops.  ABDOMEN: Soft, nontender, nondistended. Bowel sounds present. No organomegaly or mass.  EXTREMITIES: No pedal edema, cyanosis, or clubbing.  NEUROLOGIC: Cranial nerves II through XII are intact. Muscle strength 5/5 in all extremities. Sensation intact. Gait not checked.  PSYCHIATRIC: The patient is alert and oriented x 3.  SKIN: No obvious rash, lesion, or ulcer.   LABORATORY PANEL:   CBC Recent Labs  Lab 12/28/17 1505  WBC 4.8  HGB 12.3  HCT 35.7  PLT 164   ------------------------------------------------------------------------------------------------------------------  Chemistries  Recent Labs  Lab 12/28/17 1505  NA 140  K 3.9  CL 104  CO2 29  GLUCOSE 102*  BUN 25*  CREATININE 0.78  CALCIUM 9.3  AST 28  ALT 32  ALKPHOS 34*  BILITOT 0.3   ------------------------------------------------------------------------------------------------------------------  Cardiac Enzymes Recent Labs  Lab 12/28/17 1505  TROPONINI <0.03   ------------------------------------------------------------------------------------------------------------------  RADIOLOGY:  Ct Head Code Stroke Wo Contrast  Addendum Date: 12/28/2017   ADDENDUM REPORT: 12/28/2017 15:44 ADDENDUM: Study discussed by telephone with Dr. Larae Grooms on 12/28/2017 at  1532 hours. Electronically Signed   By: Genevie Ann M.D.   On: 12/28/2017 15:44   Result Date: 12/28/2017 CLINICAL DATA:  Code stroke. 62 year old female with slurred speech beginning at 1330 hours today. EXAM: CT HEAD WITHOUT CONTRAST TECHNIQUE: Contiguous axial images were obtained from the base of the skull through the vertex without intravenous contrast. COMPARISON:  Brain MRI 05/07/2016 and earlier. FINDINGS: Brain: Stable cerebral volume since the prior MRI. Occasional small chronic infarcts in the cerebellum. Stable gray-white matter differentiation throughout the brain. No midline shift, ventriculomegaly, mass effect, evidence of mass lesion, intracranial hemorrhage or evidence of cortically based acute infarction. Vascular: Mild Calcified atherosclerosis at the skull base. No suspicious intracranial vascular hyperdensity. Skull: Negative. Sinuses/Orbits: Visualized paranasal sinuses and mastoids are stable and well pneumatized. Other: Visualized orbits and scalp soft tissues are within normal limits. ASPECTS [facility] Stroke Program Early CT Score) - Ganglionic level infarction (caudate, lentiform nuclei, internal capsule, insula, M1-M3 cortex): 7 - Supraganglionic infarction (M4-M6 cortex): 3 Total score (0-10 with 10 being normal): 10 IMPRESSION: 1. No acute cortically based infarct or intracranial hemorrhage. 2. Small chronic cerebellar infarcts. 3. ASPECTS is 10. Electronically Signed: By: Genevie Ann M.D. On: 12/28/2017 15:29    EKG:   Orders placed or performed during the hospital encounter of 12/28/17  . EKG 12-Lead  . EKG 12-Lead  . ED  EKG  . ED EKG   EKG shows normal sinus rhythm with no ST-T changes, 76 bpm.  IMPRESSION AND PLAN:   62 year old female patient with no past medical history and not on any medicines comes in because of sudden onset of slurred speech without any focal neurological deficit, initial NIH stroke scale of 0, telemetry neurologist recommended observation to stroke  unit, full stroke work-up. 1.  Slurred speech, rule out TIA, patient had previously had MRI of the brain because of dizziness and it showed previous cerebellar strokes.  Patient received aspirin in the emergency room, continue aspirin, observed stroke unit get MRI of the brain, echocardiogram, ultrasound of carotids, frequent neuro checks, the patient toget fasting lipids.  Patient had lipid panel drawn in July but they were drawn in the afternoon. #2 previous history of breast cancer bilateral mastectomy, history of osteoporosis: 3.  Depression: Patient takes trazodone at night, recently started on Cymbalta 2 weeks ago. Patient takes Valtrex  for  herpes labialis as needed.  Needed. All the records are reviewed and case discussed with ED provider. Management plans discussed with the patient, family and they are in agreement.  CODE STATUS: full code  TOTAL TIME TAKING CARE OF THIS PATIENT: 56minutes.    Epifanio Lesches M.D on 12/28/2017 at 4:57 PM  Between 7am to 6pm - Pager - [phone]  After 6pm go to [email or web address] - password [email or web address] [facility]  Office  [phone]  CC: Primary care physician; Juline Patch, MD  Note: This dictation was prepared with Dragon dictation along with smaller phrase technology. Any transcriptional errors that result from this process are unintentional.

## 2017-12-29 ENCOUNTER — Inpatient Hospital Stay: Payer: BLUE CROSS/BLUE SHIELD

## 2017-12-29 ENCOUNTER — Observation Stay (HOSPITAL_COMMUNITY)
Admit: 2017-12-29 | Discharge: 2017-12-29 | Disposition: A | Discharge status: Home / Self Care | Payer: BLUE CROSS/BLUE SHIELD | Attending: Internal Medicine | Admitting: Internal Medicine

## 2017-12-29 DIAGNOSIS — R4781 Slurred speech: Secondary | ICD-10-CM | POA: Diagnosis not present

## 2017-12-29 DIAGNOSIS — R29810 Facial weakness: Secondary | ICD-10-CM | POA: Diagnosis present

## 2017-12-29 DIAGNOSIS — H547 Unspecified visual loss: Secondary | ICD-10-CM | POA: Diagnosis present

## 2017-12-29 DIAGNOSIS — I6523 Occlusion and stenosis of bilateral carotid arteries: Secondary | ICD-10-CM | POA: Diagnosis not present

## 2017-12-29 DIAGNOSIS — Z823 Family history of stroke: Secondary | ICD-10-CM | POA: Diagnosis not present

## 2017-12-29 DIAGNOSIS — I517 Cardiomegaly: Secondary | ICD-10-CM | POA: Diagnosis not present

## 2017-12-29 DIAGNOSIS — M810 Age-related osteoporosis without current pathological fracture: Secondary | ICD-10-CM | POA: Diagnosis present

## 2017-12-29 DIAGNOSIS — Z9013 Acquired absence of bilateral breasts and nipples: Secondary | ICD-10-CM | POA: Diagnosis not present

## 2017-12-29 DIAGNOSIS — I6389 Other cerebral infarction: Secondary | ICD-10-CM | POA: Diagnosis not present

## 2017-12-29 DIAGNOSIS — F329 Major depressive disorder, single episode, unspecified: Secondary | ICD-10-CM | POA: Diagnosis not present

## 2017-12-29 DIAGNOSIS — Z803 Family history of malignant neoplasm of breast: Secondary | ICD-10-CM | POA: Diagnosis not present

## 2017-12-29 DIAGNOSIS — Z791 Long term (current) use of non-steroidal anti-inflammatories (NSAID): Secondary | ICD-10-CM | POA: Diagnosis not present

## 2017-12-29 DIAGNOSIS — R29700 NIHSS score 0: Secondary | ICD-10-CM | POA: Diagnosis present

## 2017-12-29 DIAGNOSIS — I639 Cerebral infarction, unspecified: Secondary | ICD-10-CM | POA: Diagnosis present

## 2017-12-29 DIAGNOSIS — Z23 Encounter for immunization: Secondary | ICD-10-CM | POA: Diagnosis not present

## 2017-12-29 DIAGNOSIS — Z7983 Long term (current) use of bisphosphonates: Secondary | ICD-10-CM | POA: Diagnosis not present

## 2017-12-29 DIAGNOSIS — Z853 Personal history of malignant neoplasm of breast: Secondary | ICD-10-CM | POA: Diagnosis not present

## 2017-12-29 DIAGNOSIS — B001 Herpesviral vesicular dermatitis: Secondary | ICD-10-CM | POA: Diagnosis present

## 2017-12-29 LAB — LIPID PANEL
Cholesterol: 208 mg/dL — ABNORMAL HIGH (ref 0–200)
HDL: 61 mg/dL (ref >40)
LDL CALC: 139 mg/dL — AB (ref 0–99)
TRIGLYCERIDES: 42 mg/dL (ref <150)
Total CHOL/HDL Ratio: 3.4 RATIO
VLDL: 8 mg/dL (ref 0–40)

## 2017-12-29 LAB — ECHOCARDIOGRAM COMPLETE
HEIGHTINCHES: 63 in
WEIGHTICAEL: 1673.6 [oz_av]

## 2017-12-29 MED ORDER — TRAZODONE HCL 50 MG PO TABS
50.0000 mg | ORAL_TABLET | Freq: Every day | ORAL | Status: DC
  Administered 2017-12-29 (×2): 50 mg via ORAL
  Filled 2017-12-29 (×2): qty 1

## 2017-12-29 MED ORDER — ROSUVASTATIN CALCIUM 20 MG PO TABS
20.0000 mg | ORAL_TABLET | Freq: Every day | ORAL | Status: DC
  Administered 2017-12-29 – 2017-12-30 (×2): 20 mg via ORAL
  Filled 2017-12-29 (×2): qty 1

## 2017-12-29 NOTE — Progress Notes (Signed)
[facility] at [place] NAME: Amanda Osborn    MR#:  [id number]  DATE OF BIRTH:  [birth year]  SUBJECTIVE:   Patient here with slurred speech  REVIEW OF SYSTEMS:    Review of Systems  Constitutional: Negative for fever, chills weight loss HENT: Negative for ear pain, nosebleeds, congestion, facial swelling, rhinorrhea, neck pain, neck stiffness and ear discharge.   Respiratory: Negative for cough, shortness of breath, wheezing  Cardiovascular: Negative for chest pain, palpitations and leg swelling.  Gastrointestinal: Negative for heartburn, abdominal pain, vomiting, diarrhea or consitpation Genitourinary: Negative for dysuria, urgency, frequency, hematuria Musculoskeletal: Negative for back pain or joint pain Neurological: Negative, seizures, syncope, focal weakness,  numbness and headaches.  Positive slurred speech Dizziness on and off for several years Hematological: Does not bruise/bleed easily.  Psychiatric/Behavioral: Negative for hallucinations, confusion, dysphoric mood    Tolerating Diet: yes      DRUG ALLERGIES:   Allergies  Allergen Reactions  . Montelukast Sodium     VITALS:  Blood pressure 116/81, pulse 68, temperature 97.7 F (36.5 C), temperature source Oral, resp. rate 18, height 5\' 3"  (1.6 m), weight 47.4 kg, SpO2 100 %.  PHYSICAL EXAMINATION:  Constitutional: Appears well-developed and well-nourished. No distress. HENT: Normocephalic. Marland Kitchen Oropharynx is clear and moist.  Eyes: Conjunctivae and EOM are normal. PERRLA, no scleral icterus.  Neck: Normal ROM. Neck supple. No JVD. No tracheal deviation. CVS: RRR, S1/S2 +, no murmurs, no gallops, no carotid bruit.  Pulmonary: Effort and breath sounds normal, no stridor, rhonchi, wheezes, rales.  Abdominal: Soft. BS +,  no distension, tenderness, rebound or guarding.  Musculoskeletal: Normal range of motion. No edema and no tenderness.  Neuro: Alert. CN 2-12 grossly  intact. No focal deficits. Skin: Skin is warm and dry. No rash noted. Psychiatric: Normal mood and affect.      LABORATORY PANEL:   CBC Recent Labs  Lab 12/28/17 1505  WBC 4.8  HGB 12.3  HCT 35.7  PLT 164   ------------------------------------------------------------------------------------------------------------------  Chemistries  Recent Labs  Lab 12/28/17 1505  NA 140  K 3.9  CL 104  CO2 29  GLUCOSE 102*  BUN 25*  CREATININE 0.78  CALCIUM 9.3  AST 28  ALT 32  ALKPHOS 34*  BILITOT 0.3   ------------------------------------------------------------------------------------------------------------------  Cardiac Enzymes Recent Labs  Lab 12/28/17 1505  TROPONINI <0.03   ------------------------------------------------------------------------------------------------------------------  RADIOLOGY:  Mr Brain Wo Contrast  Result Date: 12/28/2017 CLINICAL DATA:  Slurred speech and right facial droop EXAM: MRI HEAD WITHOUT CONTRAST MRA HEAD WITHOUT CONTRAST TECHNIQUE: Multiplanar, multiecho pulse sequences of the brain and surrounding structures were obtained without intravenous contrast. Angiographic images of the head were obtained using MRA technique without contrast. COMPARISON:  Head CT 12/28/2017 FINDINGS: MRI HEAD FINDINGS BRAIN: There is a small focus of abnormal diffusion restriction at the peripheral aspect of the right precentral gyrus. No other diffusion abnormality. The midline structures are normal. Old bilateral cerebellar infarcts. Multifocal white matter hyperintensity, most commonly due to chronic ischemic microangiopathy. Generalized atrophy without lobar predilection. Susceptibility-sensitive sequences show no chronic microhemorrhage or superficial siderosis. SKULL AND UPPER CERVICAL SPINE: The visualized skull base, calvarium, upper cervical spine and extracranial soft tissues are normal. SINUSES/ORBITS: No fluid levels or advanced mucosal thickening. No  mastoid or middle ear effusion. The orbits are normal. MRA HEAD FINDINGS Intracranial internal carotid arteries: Normal. Anterior cerebral arteries: Normal. Middle cerebral arteries: Normal. Posterior communicating arteries: Present bilaterally. Posterior cerebral arteries: Normal. Basilar artery:  Normal. Vertebral arteries: Left dominant. Normal. Superior cerebellar arteries: Normal. Inferior cerebellar arteries: Normal. IMPRESSION: 1. Small focus of abnormal diffusion restriction within the peripheral aspect of the right precentral gyrus, likely indicating a small area of acute ischemia. Correlate for associated left-sided weakness. This is not in keeping with reported right facial droop. 2. No hemorrhage or mass effect. 3. Old bilateral cerebellar infarcts. 4. Normal intracranial MRA. Electronically Signed   By: Ulyses Jarred M.D.   On: 12/28/2017 22:18   Mr Amanda Osborn Nam Head/brain WF Cm  Result Date: 12/28/2017 CLINICAL DATA:  Slurred speech and right facial droop EXAM: MRI HEAD WITHOUT CONTRAST MRA HEAD WITHOUT CONTRAST TECHNIQUE: Multiplanar, multiecho pulse sequences of the brain and surrounding structures were obtained without intravenous contrast. Angiographic images of the head were obtained using MRA technique without contrast. COMPARISON:  Head CT 12/28/2017 FINDINGS: MRI HEAD FINDINGS BRAIN: There is a small focus of abnormal diffusion restriction at the peripheral aspect of the right precentral gyrus. No other diffusion abnormality. The midline structures are normal. Old bilateral cerebellar infarcts. Multifocal white matter hyperintensity, most commonly due to chronic ischemic microangiopathy. Generalized atrophy without lobar predilection. Susceptibility-sensitive sequences show no chronic microhemorrhage or superficial siderosis. SKULL AND UPPER CERVICAL SPINE: The visualized skull base, calvarium, upper cervical spine and extracranial soft tissues are normal. SINUSES/ORBITS: No fluid levels or advanced  mucosal thickening. No mastoid or middle ear effusion. The orbits are normal. MRA HEAD FINDINGS Intracranial internal carotid arteries: Normal. Anterior cerebral arteries: Normal. Middle cerebral arteries: Normal. Posterior communicating arteries: Present bilaterally. Posterior cerebral arteries: Normal. Basilar artery: Normal. Vertebral arteries: Left dominant. Normal. Superior cerebellar arteries: Normal. Inferior cerebellar arteries: Normal. IMPRESSION: 1. Small focus of abnormal diffusion restriction within the peripheral aspect of the right precentral gyrus, likely indicating a small area of acute ischemia. Correlate for associated left-sided weakness. This is not in keeping with reported right facial droop. 2. No hemorrhage or mass effect. 3. Old bilateral cerebellar infarcts. 4. Normal intracranial MRA. Electronically Signed   By: Ulyses Jarred M.D.   On: 12/28/2017 22:18   Ct Head Code Stroke Wo Contrast  Addendum Date: 12/28/2017   ADDENDUM REPORT: 12/28/2017 15:44 ADDENDUM: Study discussed by telephone with Dr. Larae Grooms on 12/28/2017 at 1532 hours. Electronically Signed   By: Genevie Ann M.D.   On: 12/28/2017 15:44   Result Date: 12/28/2017 CLINICAL DATA:  Code stroke. 62 year old female with slurred speech beginning at 1330 hours today. EXAM: CT HEAD WITHOUT CONTRAST TECHNIQUE: Contiguous axial images were obtained from the base of the skull through the vertex without intravenous contrast. COMPARISON:  Brain MRI 05/07/2016 and earlier. FINDINGS: Brain: Stable cerebral volume since the prior MRI. Occasional small chronic infarcts in the cerebellum. Stable gray-white matter differentiation throughout the brain. No midline shift, ventriculomegaly, mass effect, evidence of mass lesion, intracranial hemorrhage or evidence of cortically based acute infarction. Vascular: Mild Calcified atherosclerosis at the skull base. No suspicious intracranial vascular hyperdensity. Skull: Negative. Sinuses/Orbits:  Visualized paranasal sinuses and mastoids are stable and well pneumatized. Other: Visualized orbits and scalp soft tissues are within normal limits. ASPECTS [facility] Stroke Program Early CT Score) - Ganglionic level infarction (caudate, lentiform nuclei, internal capsule, insula, M1-M3 cortex): 7 - Supraganglionic infarction (M4-M6 cortex): 3 Total score (0-10 with 10 being normal): 10 IMPRESSION: 1. No acute cortically based infarct or intracranial hemorrhage. 2. Small chronic cerebellar infarcts. 3. ASPECTS is 10. Electronically Signed: By: Genevie Ann M.D. On: 12/28/2017 15:29     ASSESSMENT AND PLAN:  62 year old female with history of depression who presents with slurred speech   1.Small focus of abnormal diffusion restriction within the peripheral aspect of the right precentral gyrus, indicating a small area of acute ischemia.  Continue aspirin and add statin LDL goal is less than 70 Follow-up in neurology, PT, OT and speech consultation Follow-up on carotid Doppler and echocardiogram May need further work-up with TEE  2.  Depression: Continue Zoloft 3.  History of breast cancer with bilateral mastectomy     Management plans discussed with the patient and she is in agreement.  CODE STATUS: Full  TOTAL TIME TAKING CARE OF THIS PATIENT: 30 minutes.     POSSIBLE D/C tomorrow, DEPENDING ON CLINICAL CONDITION.   Aayana Reinertsen M.D on 12/29/2017 at 10:49 AM  Between 7am to 6pm - Pager - [phone] After 6pm go to [email or web address] - password [email or web address] [facility]  Office  [phone]  CC: Primary care physician; Juline Patch, MD  Note: This dictation was prepared with Dragon dictation along with smaller phrase technology. Any transcriptional errors that result from this process are unintentional.

## 2017-12-29 NOTE — Progress Notes (Signed)
SLP Cancellation Note  Patient Details Name: SONNIE BIAS MRN: [id number] DOB: [birth year]   Cancelled treatment:       Reason Eval/Treat Not Completed: SLP screened, no needs identified, will sign off. Reviewed chart, screened patient and spoke with nursing. No apparent speech/language/swallowing needs at bedside. Spontaneous peech is 100% intelligible, fluent, and well organized with no evidence of aphasia or dysarthria at bedside. However, patient reported she "feels her speech is still a bit effortful." SLP recommended patient follow up with her PCP and request SLP evaluation after d/c if she feels her speech continues to remains effortful. Oral motor function WNL. No facial or lingual asymmetry. Patient and nursing report toleration of current regular diet with thin liquids with no s/s aspiration or laryngeal penetration. SLP to sign off at this time. Please reconsult with any future decline in speech/language/swallowing status. Thank you for the referral.   Netty Sullivant, MA, CCC-SLP 12/29/2017, 12:29 PM

## 2017-12-29 NOTE — Plan of Care (Signed)
  Problem: Education: Goal: Knowledge of disease or condition will improve Outcome: Progressing Goal: Knowledge of secondary prevention will improve Outcome: Progressing Goal: Knowledge of patient specific risk factors addressed and post discharge goals established will improve Outcome: Progressing   Problem: Ischemic Stroke/TIA Tissue Perfusion: Goal: Complications of ischemic stroke/TIA will be minimized Outcome: Progressing   Problem: Health Behavior/Discharge Planning: Goal: Ability to manage health-related needs will improve Outcome: Progressing   Problem: Clinical Measurements: Goal: Ability to maintain clinical measurements within normal limits will improve Outcome: Progressing Goal: Will remain free from infection Outcome: Progressing Goal: Diagnostic test results will improve Outcome: Progressing   Problem: Safety: Goal: Ability to remain free from injury will improve Outcome: Progressing   Problem: Skin Integrity: Goal: Risk for impaired skin integrity will decrease Outcome: Progressing

## 2017-12-29 NOTE — Evaluation (Addendum)
Physical Therapy Evaluation Patient Details Name: Amanda Osborn MRN: [id number] DOB: 10-17-1955 Today's Date: 12/29/2017   History of Present Illness  Pt admitted for slurred speech with possible CVA. Results inconclusive at this time. Pt complains of slurred speech with R side facial droop. Also complains to therapist that she has been dizzy for past year. Does not feel it's related to position. Of note, previous cerebellar CVA. History includes hyperlipidemia.  Clinical Impression  Pt is a pleasant 62 year old female who was admitted for hyperlipidemia with possible CVA. Pt performs bed mobility/transfers independently and ambulation with cga and no AD. Pt demonstrates deficits with endurance/balance. Sensation/coordination WNL in B UE/LE. Pt eager to work with therapy. Would benefit from skilled PT to address above deficits and promote optimal return to PLOF. Pt would benefit from further PT in OP setting to specifically address balance/dizziness symptoms.     Follow Up Recommendations Outpatient PT    Equipment Recommendations  None recommended by PT    Recommendations for Other Services       Precautions / Restrictions Precautions Precautions: Fall Restrictions Weight Bearing Restrictions: No      Mobility  Bed Mobility Overal bed mobility: Independent             General bed mobility comments: safe technique with ease of effort  Transfers Overall transfer level: Independent Equipment used: None             General transfer comment: safe technique with upright posture, no use of AD  Ambulation/Gait Ambulation/Gait assistance: Min guard Gait Distance (Feet): 200 Feet Assistive device: None Gait Pattern/deviations: Decreased step length - left;Decreased step length - right     General Gait Details: ambulated with slightly decreased speed compared to baseline per pt report. She also demonstrated unsteadiness with decreased reciprocal arm swing. No  formalized LOB noted. Reports dizziness throughout session, does not appear worse with mobility efforts. On second ambulation bout, cues for upright head posture and reciprocal arm swing along with slightly increased speed.  Stairs            Wheelchair Mobility    Modified Rankin (Stroke Patients Only)       Balance Overall balance assessment: Mild deficits observed, not formally tested                                           Pertinent Vitals/Pain Pain Assessment: No/denies pain    Home Living Family/patient expects to be discharged to:: Private residence Living Arrangements: Spouse/significant other Available Help at Discharge: Family;Available 24 hours/day Type of Home: House Home Access: Stairs to enter Entrance Stairs-Rails: Left Entrance Stairs-Number of Steps: 5 Home Layout: Able to live on main level with bedroom/bathroom;Two level Home Equipment: None      Prior Function Level of Independence: Independent         Comments: working full time; active; no falls in past year     Hand Dominance        Extremity/Trunk Assessment   Upper Extremity Assessment Upper Extremity Assessment: Overall WFL for tasks assessed    Lower Extremity Assessment Lower Extremity Assessment: Overall WFL for tasks assessed       Communication   Communication: No difficulties  Cognition Arousal/Alertness: Awake/alert Behavior During Therapy: WFL for tasks assessed/performed Overall Cognitive Status: Within Functional Limits for tasks assessed  General Comments      Exercises     Assessment/Plan    PT Assessment Patient needs continued PT services  PT Problem List Decreased balance       PT Treatment Interventions Gait training;Balance training    PT Goals (Current goals can be found in the Care Plan section)  Acute Rehab PT Goals Patient Stated Goal: to not be dizzy PT Goal  Formulation: With patient Time For Goal Achievement: 01/12/18 Potential to Achieve Goals: Good    Frequency Min 2X/week   Barriers to discharge        Co-evaluation               AM-PAC PT "6 Clicks" Daily Activity  Outcome Measure Difficulty turning over in bed (including adjusting bedclothes, sheets and blankets)?: None Difficulty moving from lying on back to sitting on the side of the bed? : None Difficulty sitting down on and standing up from a chair with arms (e.g., wheelchair, bedside commode, etc,.)?: None Help needed moving to and from a bed to chair (including a wheelchair)?: None Help needed walking in hospital room?: None Help needed climbing 3-5 steps with a railing? : A Little 6 Click Score: 23    End of Session Equipment Utilized During Treatment: Gait belt Activity Tolerance: Patient tolerated treatment well Patient left: in bed Nurse Communication: Mobility status PT Visit Diagnosis: Unsteadiness on feet (R26.81)    Time: 0076-2263 PT Time Calculation (min) (ACUTE ONLY): 19 min   Charges:   PT Evaluation $PT Eval Low Complexity: 1 Low PT Treatments $Gait Training: 8-22 mins        Greggory Stallion, PT, DPT [phone]   Amanda Osborn 12/29/2017, 2:03 PM

## 2017-12-29 NOTE — Progress Notes (Signed)
*  PRELIMINARY RESULTS* Echocardiogram 2D Echocardiogram has been performed.  Amanda Osborn 12/29/2017, 9:52 AM

## 2017-12-29 NOTE — Progress Notes (Addendum)
PT Cancellation Note  Patient Details Name: Amanda Osborn MRN: [id number] DOB: 03/14/1956   Cancelled Treatment:     Pt unavailable for eval d/t echocardiogram in process. Will attempt later today.  Algis Downs, SPT   Algis Downs 12/29/2017, 9:02 AM

## 2017-12-29 NOTE — Progress Notes (Signed)
OT Cancellation Note  Patient Details Name: Amanda Osborn MRN: [id number] DOB: 10/09/1955   Cancelled Treatment:    Reason Eval/Treat Not Completed: OT screened, no needs identified, will sign off.  Order received and chart reviewed.  Spoke to PT about status and met with patient and her husband and was screened for OT needs.  Pt has dizziness off and on but is able to complete ADLs and is back at baseline per screen with no OT needs identified.  Pt stated that she is going to have more testing done.  Please re-consult if medical status changes or declines and OT is needed.  Thank you for the referral.  Chrys Racer, OTR/L ascom [phone] 12/29/17, 12:09 PM

## 2017-12-30 ENCOUNTER — Encounter
Admission: EM | Disposition: A | Discharge status: Home / Self Care | Payer: Self-pay | Source: Home / Self Care | Attending: Internal Medicine

## 2017-12-30 ENCOUNTER — Inpatient Hospital Stay
Admit: 2017-12-30 | Discharge: 2017-12-30 | Disposition: A | Discharge status: Home / Self Care | Payer: BLUE CROSS/BLUE SHIELD | Attending: Cardiology | Admitting: Cardiology

## 2017-12-30 HISTORY — PX: TEE WITHOUT CARDIOVERSION: SHX5443

## 2017-12-30 LAB — HIV ANTIBODY (ROUTINE TESTING W REFLEX): HIV SCREEN 4TH GENERATION: NONREACTIVE

## 2017-12-30 LAB — HEMOGLOBIN A1C
Hgb A1c MFr Bld: 5.3 % (ref 4.8–5.6)
MEAN PLASMA GLUCOSE: 105 mg/dL

## 2017-12-30 SURGERY — ECHOCARDIOGRAM, TRANSESOPHAGEAL
Anesthesia: Moderate Sedation

## 2017-12-30 MED ORDER — ROSUVASTATIN CALCIUM 20 MG PO TABS: 20.0000 mg | ORAL_TABLET | Freq: Every day | ORAL | 0 refills | Status: DC

## 2017-12-30 MED ORDER — FENTANYL CITRATE (PF) 100 MCG/2ML IJ SOLN
INTRAMUSCULAR | Status: AC | PRN
  Administered 2017-12-30: 50 ug via INTRAVENOUS
  Administered 2017-12-30: 25 ug via INTRAVENOUS

## 2017-12-30 MED ORDER — SODIUM CHLORIDE 0.9 % IV SOLN: INTRAVENOUS | Status: DC

## 2017-12-30 MED ORDER — MIDAZOLAM HCL 5 MG/5ML IJ SOLN
INTRAMUSCULAR | Status: AC
  Filled 2017-12-30: qty 5

## 2017-12-30 MED ORDER — MIDAZOLAM HCL 2 MG/2ML IJ SOLN
INTRAMUSCULAR | Status: AC | PRN
  Administered 2017-12-30: 1 mg via INTRAVENOUS
  Administered 2017-12-30: 2 mg via INTRAVENOUS

## 2017-12-30 MED ORDER — LIDOCAINE VISCOUS HCL 2 % MT SOLN
OROMUCOSAL | Status: AC
  Administered 2017-12-30: 15 mL
  Filled 2017-12-30: qty 15

## 2017-12-30 MED ORDER — SODIUM CHLORIDE FLUSH 0.9 % IV SOLN
INTRAVENOUS | Status: AC
  Filled 2017-12-30: qty 10

## 2017-12-30 MED ORDER — BUTAMBEN-TETRACAINE-BENZOCAINE 2-2-14 % EX AERO
INHALATION_SPRAY | CUTANEOUS | Status: AC
  Administered 2017-12-30: 10:00:00
  Filled 2017-12-30: qty 5

## 2017-12-30 MED ORDER — ASPIRIN 325 MG PO TBEC: 325.0000 mg | DELAYED_RELEASE_TABLET | Freq: Every day | ORAL | 0 refills | Status: AC

## 2017-12-30 MED ORDER — FENTANYL CITRATE (PF) 100 MCG/2ML IJ SOLN
INTRAMUSCULAR | Status: AC
  Filled 2017-12-30: qty 2

## 2017-12-30 NOTE — Care Management Note (Signed)
Case Management Note  Patient Details  Name: Amanda Osborn MRN: [id number] Date of Birth: Jan 07, 1956  Subjective/Objective:  Admitted to [facility] with slurred speech. Lives with family. Sees Dr. Otilio Miu.  Expressed no problems getting prescriptions filled. Takes care of all basic activities of daily living herself.  TEE completed today. Discharge to home per Dr. Benjie Karvonen.                Action/Plan: Physical therapy evaluation completed. Recommending outpatient physical therapy. Discussed recommended services. Will except at this time.  Will fax referral to rehabilitation department   Expected Discharge Date:  12/30/17               Expected Discharge Plan:     In-House Referral:   yes  Discharge planning Services   yes  Post Acute Care Choice:   yes Choice offered to:   patient  DME Arranged:    DME Agency:     HH Arranged:    HH Agency:     Status of Service:     If discussed at [employer], dates discussed:    Additional Comments:  Shelbie Ammons, RN MSN CCM Care Management [phone] 12/30/2017, 11:45 AM

## 2017-12-30 NOTE — Discharge Summary (Signed)
[place] [facility] [place] NAME: Amanda Osborn    MR#:  [id number]  DATE OF BIRTH:  Feb 23, 1956  DATE OF ADMISSION:  12/28/2017 ADMITTING PHYSICIAN: Epifanio Lesches, MD  DATE OF DISCHARGE: 12/30/2017  PRIMARY CARE PHYSICIAN: Juline Patch, MD    ADMISSION DIAGNOSIS:  Stroke [facility]) [I63.9] Cerebrovascular accident (CVA), unspecified mechanism ([place]) [I63.9]  DISCHARGE DIAGNOSIS:  Active Problems:   Slurred speech   CVA (cerebral vascular accident) ([place])   SECONDARY DIAGNOSIS:   Past Medical History:  Diagnosis Date  . Allergy   . Cancer ([place])   . Osteoporosis     HOSPITAL COURSE:   62 year old female with history of depression who presents with slurred speech   1.Small focus of abnormal diffusion restriction within the peripheral aspect of the right precentral gyrus, indicating a small area of acute ischemia. She was seen and evaluated by neurology as well as physical therapy/OT/speech.  She underwent TEE as per recommendations by neurology. TEE did not show emboli/vegetation. She will need to continue aspirin and statin LDL goal is less than 70 Cardiogram showed no evidence of cardiac emboli.  Carotid ultrasound showed no hemodynamically significant stenosis.   He also had EEG which was unremarkable.   2.  Depression: Continue Zoloft 3.  History of breast cancer with bilateral mastectomy   DISCHARGE CONDITIONS AND DIET:   Stable for discharge on heart healthy diet  CONSULTS OBTAINED:  Treatment Team:  Catarina Hartshorn, MD Alexis Goodell, MD Teodoro Spray, MD  DRUG ALLERGIES:   Allergies  Allergen Reactions  . Montelukast Sodium     DISCHARGE MEDICATIONS:   Allergies as of 12/30/2017      Reactions   Montelukast Sodium       Medication List    STOP taking these medications   amoxicillin-clavulanate 875-125 MG tablet Commonly known as:  AUGMENTIN     TAKE these medications   acyclovir 400 MG  tablet Commonly known as:  ZOVIRAX Take 400 mg by mouth 2 (two) times daily.   aspirin 325 MG EC tablet Take 1 tablet (325 mg total) by mouth daily.   ibandronate 150 MG tablet Commonly known as:  BONIVA Take 1 tablet by mouth every 30 (thirty) days.   meloxicam 7.5 MG tablet Commonly known as:  MOBIC Take 7.5 mg daily by mouth. Reche Dixon   MULTI-VITAMINS Tabs Take 1 tablet by mouth daily.   rosuvastatin 20 MG tablet Commonly known as:  CRESTOR Take 1 tablet (20 mg total) by mouth daily.   sertraline 50 MG tablet Commonly known as:  ZOLOFT Take 50 mg by mouth daily.   traZODone 50 MG tablet Commonly known as:  DESYREL Take 50-100 mg by mouth at bedtime.         Today   CHIEF COMPLAINT:  Slurred speech is resolved   VITAL SIGNS:  Blood pressure 111/69, pulse 67, temperature 97.7 F (36.5 C), temperature source Oral, resp. rate 15, height 5\' 3"  (1.6 m), weight 47.4 kg, SpO2 98 %.   REVIEW OF SYSTEMS:  Review of Systems  Constitutional: Negative.  Negative for chills, fever and malaise/fatigue.  HENT: Negative.  Negative for ear discharge, ear pain, hearing loss, nosebleeds and sore throat.   Eyes: Negative.  Negative for blurred vision and pain.  Respiratory: Negative.  Negative for cough, hemoptysis, shortness of breath and wheezing.   Cardiovascular: Negative.  Negative for chest pain, palpitations and leg swelling.  Gastrointestinal: Negative.  Negative for abdominal  pain, blood in stool, diarrhea, nausea and vomiting.  Genitourinary: Negative.  Negative for dysuria.  Musculoskeletal: Negative.  Negative for back pain.  Skin: Negative.   Neurological: Negative for dizziness, tremors, speech change, focal weakness, seizures and headaches.  Endo/Heme/Allergies: Negative.  Does not bruise/bleed easily.  Psychiatric/Behavioral: Negative.  Negative for depression, hallucinations and suicidal ideas.     PHYSICAL EXAMINATION:  GENERAL:  62 y.o.-year-old  patient lying in the bed with no acute distress.  NECK:  Supple, no jugular venous distention. No thyroid enlargement, no tenderness.  LUNGS: Normal breath sounds bilaterally, no wheezing, rales,rhonchi  No use of accessory muscles of respiration.  CARDIOVASCULAR: S1, S2 normal. No murmurs, rubs, or gallops.  ABDOMEN: Soft, non-tender, non-distended. Bowel sounds present. No organomegaly or mass.  EXTREMITIES: No pedal edema, cyanosis, or clubbing.  PSYCHIATRIC: The patient is alert and oriented x 3.  SKIN: No obvious rash, lesion, or ulcer.   DATA REVIEW:   CBC Recent Labs  Lab 12/28/17 1505  WBC 4.8  HGB 12.3  HCT 35.7  PLT 164    Chemistries  Recent Labs  Lab 12/28/17 1505  NA 140  K 3.9  CL 104  CO2 29  GLUCOSE 102*  BUN 25*  CREATININE 0.78  CALCIUM 9.3  AST 28  ALT 32  ALKPHOS 34*  BILITOT 0.3    Cardiac Enzymes Recent Labs  Lab 12/28/17 1505  TROPONINI <0.03    Microbiology Results  @[email or web address] @  RADIOLOGY:  Mr Brain Wo Contrast  Result Date: 12/28/2017 CLINICAL DATA:  Slurred speech and right facial droop EXAM: MRI HEAD WITHOUT CONTRAST MRA HEAD WITHOUT CONTRAST TECHNIQUE: Multiplanar, multiecho pulse sequences of the brain and surrounding structures were obtained without intravenous contrast. Angiographic images of the head were obtained using MRA technique without contrast. COMPARISON:  Head CT 12/28/2017 FINDINGS: MRI HEAD FINDINGS BRAIN: There is a small focus of abnormal diffusion restriction at the peripheral aspect of the right precentral gyrus. No other diffusion abnormality. The midline structures are normal. Old bilateral cerebellar infarcts. Multifocal white matter hyperintensity, most commonly due to chronic ischemic microangiopathy. Generalized atrophy without lobar predilection. Susceptibility-sensitive sequences show no chronic microhemorrhage or superficial siderosis. SKULL AND UPPER CERVICAL SPINE: The visualized skull base, calvarium,  upper cervical spine and extracranial soft tissues are normal. SINUSES/ORBITS: No fluid levels or advanced mucosal thickening. No mastoid or middle ear effusion. The orbits are normal. MRA HEAD FINDINGS Intracranial internal carotid arteries: Normal. Anterior cerebral arteries: Normal. Middle cerebral arteries: Normal. Posterior communicating arteries: Present bilaterally. Posterior cerebral arteries: Normal. Basilar artery: Normal. Vertebral arteries: Left dominant. Normal. Superior cerebellar arteries: Normal. Inferior cerebellar arteries: Normal. IMPRESSION: 1. Small focus of abnormal diffusion restriction within the peripheral aspect of the right precentral gyrus, likely indicating a small area of acute ischemia. Correlate for associated left-sided weakness. This is not in keeping with reported right facial droop. 2. No hemorrhage or mass effect. 3. Old bilateral cerebellar infarcts. 4. Normal intracranial MRA. Electronically Signed   By: Ulyses Jarred M.D.   On: 12/28/2017 22:18   US Carotid Bilateral (at [facility] Only)  Result Date: 12/29/2017 CLINICAL DATA:  Stroke symptoms EXAM: BILATERAL CAROTID DUPLEX ULTRASOUND TECHNIQUE: Pearline Cables scale imaging, color Doppler and duplex ultrasound were performed of bilateral carotid and vertebral arteries in the neck. COMPARISON:  12/28/2017 FINDINGS: Criteria: Quantification of carotid stenosis is based on velocity parameters that correlate the residual internal carotid diameter with NASCET-based stenosis levels, using the diameter of the distal internal carotid lumen  as the denominator for stenosis measurement. The following velocity measurements were obtained: RIGHT ICA: 98/25 cm/sec CCA: 53/66 cm/sec SYSTOLIC ICA/CCA RATIO:  1.0 ECA: 68 cm/sec LEFT ICA: 112/29 cm/sec CCA: 440/34 cm/sec SYSTOLIC ICA/CCA RATIO:  1.0 ECA: 116 cm/sec RIGHT CAROTID ARTERY: Minor intimal thickening and atherosclerosis. Tortuosity noted. No hemodynamically significant right ICA stenosis,  velocity elevation, or turbulent flow. Degree of narrowing less than 50%. RIGHT VERTEBRAL ARTERY:  Antegrade LEFT CAROTID ARTERY: Minor intimal thickening and atherosclerosis. Tortuosity noted. No hemodynamically significant left ICA stenosis, velocity elevation, or turbulent flow. LEFT VERTEBRAL ARTERY:  Antegrade IMPRESSION: Minor carotid atherosclerosis and tortuosity. No hemodynamically significant ICA stenosis. Degree of narrowing less than 50% bilaterally by ultrasound criteria. Patent antegrade vertebral flow bilaterally Electronically Signed   By: Jerilynn Mages.  Shick M.D.   On: 12/29/2017 16:57   Mr Jodene Nam Head/brain VQ Cm  Result Date: 12/28/2017 CLINICAL DATA:  Slurred speech and right facial droop EXAM: MRI HEAD WITHOUT CONTRAST MRA HEAD WITHOUT CONTRAST TECHNIQUE: Multiplanar, multiecho pulse sequences of the brain and surrounding structures were obtained without intravenous contrast. Angiographic images of the head were obtained using MRA technique without contrast. COMPARISON:  Head CT 12/28/2017 FINDINGS: MRI HEAD FINDINGS BRAIN: There is a small focus of abnormal diffusion restriction at the peripheral aspect of the right precentral gyrus. No other diffusion abnormality. The midline structures are normal. Old bilateral cerebellar infarcts. Multifocal white matter hyperintensity, most commonly due to chronic ischemic microangiopathy. Generalized atrophy without lobar predilection. Susceptibility-sensitive sequences show no chronic microhemorrhage or superficial siderosis. SKULL AND UPPER CERVICAL SPINE: The visualized skull base, calvarium, upper cervical spine and extracranial soft tissues are normal. SINUSES/ORBITS: No fluid levels or advanced mucosal thickening. No mastoid or middle ear effusion. The orbits are normal. MRA HEAD FINDINGS Intracranial internal carotid arteries: Normal. Anterior cerebral arteries: Normal. Middle cerebral arteries: Normal. Posterior communicating arteries: Present bilaterally.  Posterior cerebral arteries: Normal. Basilar artery: Normal. Vertebral arteries: Left dominant. Normal. Superior cerebellar arteries: Normal. Inferior cerebellar arteries: Normal. IMPRESSION: 1. Small focus of abnormal diffusion restriction within the peripheral aspect of the right precentral gyrus, likely indicating a small area of acute ischemia. Correlate for associated left-sided weakness. This is not in keeping with reported right facial droop. 2. No hemorrhage or mass effect. 3. Old bilateral cerebellar infarcts. 4. Normal intracranial MRA. Electronically Signed   By: Ulyses Jarred M.D.   On: 12/28/2017 22:18   Ct Head Code Stroke Wo Contrast  Addendum Date: 12/28/2017   ADDENDUM REPORT: 12/28/2017 15:44 ADDENDUM: Study discussed by telephone with Dr. Larae Grooms on 12/28/2017 at 1532 hours. Electronically Signed   By: Genevie Ann M.D.   On: 12/28/2017 15:44   Result Date: 12/28/2017 CLINICAL DATA:  Code stroke. 62 year old female with slurred speech beginning at 1330 hours today. EXAM: CT HEAD WITHOUT CONTRAST TECHNIQUE: Contiguous axial images were obtained from the base of the skull through the vertex without intravenous contrast. COMPARISON:  Brain MRI 05/07/2016 and earlier. FINDINGS: Brain: Stable cerebral volume since the prior MRI. Occasional small chronic infarcts in the cerebellum. Stable gray-white matter differentiation throughout the brain. No midline shift, ventriculomegaly, mass effect, evidence of mass lesion, intracranial hemorrhage or evidence of cortically based acute infarction. Vascular: Mild Calcified atherosclerosis at the skull base. No suspicious intracranial vascular hyperdensity. Skull: Negative. Sinuses/Orbits: Visualized paranasal sinuses and mastoids are stable and well pneumatized. Other: Visualized orbits and scalp soft tissues are within normal limits. ASPECTS [facility] Stroke Program Early CT Score) - Ganglionic level infarction (caudate, lentiform  nuclei, internal  capsule, insula, M1-M3 cortex): 7 - Supraganglionic infarction (M4-M6 cortex): 3 Total score (0-10 with 10 being normal): 10 IMPRESSION: 1. No acute cortically based infarct or intracranial hemorrhage. 2. Small chronic cerebellar infarcts. 3. ASPECTS is 10. Electronically Signed: By: Genevie Ann M.D. On: 12/28/2017 15:29      Allergies as of 12/30/2017      Reactions   Montelukast Sodium       Medication List    STOP taking these medications   amoxicillin-clavulanate 875-125 MG tablet Commonly known as:  AUGMENTIN     TAKE these medications   acyclovir 400 MG tablet Commonly known as:  ZOVIRAX Take 400 mg by mouth 2 (two) times daily.   aspirin 325 MG EC tablet Take 1 tablet (325 mg total) by mouth daily.   ibandronate 150 MG tablet Commonly known as:  BONIVA Take 1 tablet by mouth every 30 (thirty) days.   meloxicam 7.5 MG tablet Commonly known as:  MOBIC Take 7.5 mg daily by mouth. Reche Dixon   MULTI-VITAMINS Tabs Take 1 tablet by mouth daily.   rosuvastatin 20 MG tablet Commonly known as:  CRESTOR Take 1 tablet (20 mg total) by mouth daily.   sertraline 50 MG tablet Commonly known as:  ZOLOFT Take 50 mg by mouth daily.   traZODone 50 MG tablet Commonly known as:  DESYREL Take 50-100 mg by mouth at bedtime.         Management plans discussed with the patient and she is in agreement. Stable for discharge home  Patient should follow up with pcp  CODE STATUS:     Code Status Orders  (From admission, onward)         Start     Ordered   12/28/17 1617  Full code  Continuous     12/28/17 1617        Code Status History    This patient has a current code status but no historical code status.    Advance Directive Documentation     Most Recent Value  Type of Advance Directive  Living will  Pre-existing out of facility DNR order (yellow form or pink MOST form)  -  "MOST" Form in Place?  -      TOTAL TIME TAKING CARE OF THIS PATIENT: 38 minutes.     Note: This dictation was prepared with Dragon dictation along with smaller phrase technology. Any transcriptional errors that result from this process are unintentional.  Iasha Mccalister M.D on 12/30/2017 at 9:51 AM  Between 7am to 6pm - Pager - [phone] After 6pm go to [email or web address] - password [email or web address] [facility]  Office  [phone]  CC: Primary care physician; Juline Patch, MD

## 2017-12-30 NOTE — Progress Notes (Addendum)
Subjective: Patient awake alert and oriented x4.  She denies any new stroke or strokelike symptoms since admission.  Slurred speech and left facial droop resolved with no residual deficit.  Husband at the bedside.   Objective: Current vital signs: BP 140/85 (BP Location: Right Arm)   Pulse 65   Temp 98.6 F (37 C) (Oral)   Resp 18   Ht 5\' 3"  (1.6 m)   Wt 47.4 kg   SpO2 100%   BMI 18.53 kg/m  Vital signs in last 24 hours: Temp:  [97.7 F (36.5 C)-98.6 F (37 C)] 98.6 F (37 C) (09/20 1110) Pulse Rate:  [64-83] 65 (09/20 1110) Resp:  [12-26] 18 (09/20 1110) BP: (111-149)/(69-92) 140/85 (09/20 1110) SpO2:  [97 %-100 %] 100 % (09/20 1110)  Intake/Output from previous day: 09/19 0701 - 09/20 0700 In: 718.1 [I.V.:718.1] Out: -  Intake/Output this shift: Total I/O In: 1355.3 [I.V.:1355.3] Out: -  Nutritional status:  Diet Order            Diet Heart Room service appropriate? Yes; Fluid consistency: Thin  Diet effective now              Physical Exam   Vitals Blood pressure 140/85, pulse 65, temperature 98.6 F (37 C), temperature source Oral, resp. rate 18, height 5\' 3"  (1.6 m), weight 47.4 kg, SpO2 100 %.  Neurological Exam   Mental Status: Alert, oriented, thought content appropriate. Speech fluent without evidence of aphasia. Able to follow 3 step commands without difficulty. Attention span and concentration seemed appropriate  Cranial Nerves: II: Discs flat bilaterally; Visual fields grossly normal, pupils equal, round, reactive to light and accommodation III,IV, VI: ptosis not present, extra-ocular motions intact bilaterally V,VII: smile symmetric, facial light touch sensationintact VIII: hearing normal bilaterally IX,X: gag reflex deferred XI: bilateral shoulder shrug XII: midline tongue extension Motor: Right :Upper extremity 5/5Without pronator driftLeft: Upper extremity 5/5 without pronator drift Right:Lower extremity  5/5Left: Lower extremity 5/5 Tone and bulk:normal tone throughout; no atrophy noted Sensory: Pinprick and light touchintact on the right Deep Tendon Reflexes: 2+ and symmetric throughout Plantars: Right:muteLeft: mute Cerebellar: Finger-to-nosetesting intact bilaterally.Heel to shin testing normal bilaterally Gait: Not tested due to safety   DATA REVIEWED  Lab Results: Basic Metabolic Panel: Recent Labs  Lab 12/28/17 1505  NA 140  K 3.9  CL 104  CO2 29  GLUCOSE 102*  BUN 25*  CREATININE 0.78  CALCIUM 9.3    Liver Function Tests: Recent Labs  Lab 12/28/17 1505  AST 28  ALT 32  ALKPHOS 34*  BILITOT 0.3  PROT 6.5  ALBUMIN 4.4   No results for input(s): LIPASE, AMYLASE in the last 168 hours. No results for input(s): AMMONIA in the last 168 hours.  CBC: Recent Labs  Lab 12/28/17 1505  WBC 4.8  NEUTROABS 2.5  HGB 12.3  HCT 35.7  MCV 95.0  PLT 164    Cardiac Enzymes: Recent Labs  Lab 12/28/17 1505  TROPONINI <0.03    Lipid Panel: Recent Labs  Lab 12/29/17 0326  CHOL 208*  TRIG 42  HDL 61  CHOLHDL 3.4  VLDL 8  LDLCALC 139*    CBG: Recent Labs  Lab 12/28/17 1504  GLUCAP 85    Microbiology: No results found for this or any previous visit.  Coagulation Studies: Recent Labs    12/28/17 1505  LABPROT 14.2  INR 1.11    Imaging: Mr Brain Wo Contrast  Result Date: 12/28/2017 CLINICAL DATA:  Slurred speech and  right facial droop EXAM: MRI HEAD WITHOUT CONTRAST MRA HEAD WITHOUT CONTRAST TECHNIQUE: Multiplanar, multiecho pulse sequences of the brain and surrounding structures were obtained without intravenous contrast. Angiographic images of the head were obtained using MRA technique without contrast. COMPARISON:  Head CT 12/28/2017 FINDINGS: MRI HEAD FINDINGS BRAIN: There is a small focus of abnormal diffusion restriction at the peripheral aspect of the right  precentral gyrus. No other diffusion abnormality. The midline structures are normal. Old bilateral cerebellar infarcts. Multifocal white matter hyperintensity, most commonly due to chronic ischemic microangiopathy. Generalized atrophy without lobar predilection. Susceptibility-sensitive sequences show no chronic microhemorrhage or superficial siderosis. SKULL AND UPPER CERVICAL SPINE: The visualized skull base, calvarium, upper cervical spine and extracranial soft tissues are normal. SINUSES/ORBITS: No fluid levels or advanced mucosal thickening. No mastoid or middle ear effusion. The orbits are normal. MRA HEAD FINDINGS Intracranial internal carotid arteries: Normal. Anterior cerebral arteries: Normal. Middle cerebral arteries: Normal. Posterior communicating arteries: Present bilaterally. Posterior cerebral arteries: Normal. Basilar artery: Normal. Vertebral arteries: Left dominant. Normal. Superior cerebellar arteries: Normal. Inferior cerebellar arteries: Normal. IMPRESSION: 1. Small focus of abnormal diffusion restriction within the peripheral aspect of the right precentral gyrus, likely indicating a small area of acute ischemia. Correlate for associated left-sided weakness. This is not in keeping with reported right facial droop. 2. No hemorrhage or mass effect. 3. Old bilateral cerebellar infarcts. 4. Normal intracranial MRA. Electronically Signed   By: Ulyses Jarred M.D.   On: 12/28/2017 22:18   US Carotid Bilateral (at [facility] Only)  Result Date: 12/29/2017 CLINICAL DATA:  Stroke symptoms EXAM: BILATERAL CAROTID DUPLEX ULTRASOUND TECHNIQUE: Pearline Cables scale imaging, color Doppler and duplex ultrasound were performed of bilateral carotid and vertebral arteries in the neck. COMPARISON:  12/28/2017 FINDINGS: Criteria: Quantification of carotid stenosis is based on velocity parameters that correlate the residual internal carotid diameter with NASCET-based stenosis levels, using the diameter of the distal  internal carotid lumen as the denominator for stenosis measurement. The following velocity measurements were obtained: RIGHT ICA: 98/25 cm/sec CCA: 90/24 cm/sec SYSTOLIC ICA/CCA RATIO:  1.0 ECA: 68 cm/sec LEFT ICA: 112/29 cm/sec CCA: 097/35 cm/sec SYSTOLIC ICA/CCA RATIO:  1.0 ECA: 116 cm/sec RIGHT CAROTID ARTERY: Minor intimal thickening and atherosclerosis. Tortuosity noted. No hemodynamically significant right ICA stenosis, velocity elevation, or turbulent flow. Degree of narrowing less than 50%. RIGHT VERTEBRAL ARTERY:  Antegrade LEFT CAROTID ARTERY: Minor intimal thickening and atherosclerosis. Tortuosity noted. No hemodynamically significant left ICA stenosis, velocity elevation, or turbulent flow. LEFT VERTEBRAL ARTERY:  Antegrade IMPRESSION: Minor carotid atherosclerosis and tortuosity. No hemodynamically significant ICA stenosis. Degree of narrowing less than 50% bilaterally by ultrasound criteria. Patent antegrade vertebral flow bilaterally Electronically Signed   By: Jerilynn Mages.  Shick M.D.   On: 12/29/2017 16:57   Mr Jodene Nam Head/brain HG Cm  Result Date: 12/28/2017 CLINICAL DATA:  Slurred speech and right facial droop EXAM: MRI HEAD WITHOUT CONTRAST MRA HEAD WITHOUT CONTRAST TECHNIQUE: Multiplanar, multiecho pulse sequences of the brain and surrounding structures were obtained without intravenous contrast. Angiographic images of the head were obtained using MRA technique without contrast. COMPARISON:  Head CT 12/28/2017 FINDINGS: MRI HEAD FINDINGS BRAIN: There is a small focus of abnormal diffusion restriction at the peripheral aspect of the right precentral gyrus. No other diffusion abnormality. The midline structures are normal. Old bilateral cerebellar infarcts. Multifocal white matter hyperintensity, most commonly due to chronic ischemic microangiopathy. Generalized atrophy without lobar predilection. Susceptibility-sensitive sequences show no chronic microhemorrhage or superficial siderosis. SKULL AND UPPER  CERVICAL SPINE: The visualized skull base, calvarium, upper cervical spine and extracranial soft tissues are normal. SINUSES/ORBITS: No fluid levels or advanced mucosal thickening. No mastoid or middle ear effusion. The orbits are normal. MRA HEAD FINDINGS Intracranial internal carotid arteries: Normal. Anterior cerebral arteries: Normal. Middle cerebral arteries: Normal. Posterior communicating arteries: Present bilaterally. Posterior cerebral arteries: Normal. Basilar artery: Normal. Vertebral arteries: Left dominant. Normal. Superior cerebellar arteries: Normal. Inferior cerebellar arteries: Normal. IMPRESSION: 1. Small focus of abnormal diffusion restriction within the peripheral aspect of the right precentral gyrus, likely indicating a small area of acute ischemia. Correlate for associated left-sided weakness. This is not in keeping with reported right facial droop. 2. No hemorrhage or mass effect. 3. Old bilateral cerebellar infarcts. 4. Normal intracranial MRA. Electronically Signed   By: Ulyses Jarred M.D.   On: 12/28/2017 22:18   Ct Head Code Stroke Wo Contrast  Addendum Date: 12/28/2017   ADDENDUM REPORT: 12/28/2017 15:44 ADDENDUM: Study discussed by telephone with Dr. Larae Grooms on 12/28/2017 at 1532 hours. Electronically Signed   By: Genevie Ann M.D.   On: 12/28/2017 15:44   Result Date: 12/28/2017 CLINICAL DATA:  Code stroke. 62 year old female with slurred speech beginning at 1330 hours today. EXAM: CT HEAD WITHOUT CONTRAST TECHNIQUE: Contiguous axial images were obtained from the base of the skull through the vertex without intravenous contrast. COMPARISON:  Brain MRI 05/07/2016 and earlier. FINDINGS: Brain: Stable cerebral volume since the prior MRI. Occasional small chronic infarcts in the cerebellum. Stable gray-white matter differentiation throughout the brain. No midline shift, ventriculomegaly, mass effect, evidence of mass lesion, intracranial hemorrhage or evidence of cortically based  acute infarction. Vascular: Mild Calcified atherosclerosis at the skull base. No suspicious intracranial vascular hyperdensity. Skull: Negative. Sinuses/Orbits: Visualized paranasal sinuses and mastoids are stable and well pneumatized. Other: Visualized orbits and scalp soft tissues are within normal limits. ASPECTS [facility] Stroke Program Early CT Score) - Ganglionic level infarction (caudate, lentiform nuclei, internal capsule, insula, M1-M3 cortex): 7 - Supraganglionic infarction (M4-M6 cortex): 3 Total score (0-10 with 10 being normal): 10 IMPRESSION: 1. No acute cortically based infarct or intracranial hemorrhage. 2. Small chronic cerebellar infarcts. 3. ASPECTS is 10. Electronically Signed: By: Genevie Ann M.D. On: 12/28/2017 15:29    Medications:  I have reviewed the patient's current medications. Prior to Admission:  Medications Prior to Admission  Medication Sig Dispense Refill Last Dose  . acyclovir (ZOVIRAX) 400 MG tablet Take 400 mg by mouth 2 (two) times daily.   12/28/2017 at 0800  . ibandronate (BONIVA) 150 MG tablet Take 1 tablet by mouth every 30 (thirty) days.   Past Month at Unknown time  . meloxicam (MOBIC) 7.5 MG tablet Take 7.5 mg daily by mouth. Reche Dixon  1 12/28/2017 at 0800  . Multiple Vitamin (MULTI-VITAMINS) TABS Take 1 tablet by mouth daily.   12/28/2017 at 0800  . sertraline (ZOLOFT) 50 MG tablet Take 50 mg by mouth daily.   0 12/28/2017 at 0800  . traZODone (DESYREL) 50 MG tablet Take 50-100 mg by mouth at bedtime.   0 12/27/2017 at 2100  . amoxicillin-clavulanate (AUGMENTIN) 875-125 MG tablet Take 1 tablet by mouth 2 (two) times daily. (Patient not taking: Reported on 12/28/2017) 20 tablet 0 Not Taking at Unknown time   Scheduled: . acyclovir  400 mg Oral BID  . aspirin  300 mg Rectal Daily   Or  . aspirin EC  325 mg Oral Daily  . enoxaparin (LOVENOX) injection  40 mg Subcutaneous Q24H  .  fentaNYL      . meloxicam  7.5 mg Oral Daily  . midazolam      . multivitamin  with minerals  1 tablet Oral Daily  . rosuvastatin  20 mg Oral Daily  . senna-docusate  1 tablet Oral QHS  . sertraline  50 mg Oral Daily  . sodium chloride flush      . traZODone  50-100 mg Oral QHS    Assessment: 62 y.o. female with history of old cerebellar infarcts presenting  with slurred speech, brief loss of vision, and right sided facial droop found to have acute infarct in the peripheral aspect of the right precentral gyrus.  Etiology likely embolic. MRA unremarkable.    Echocardiogram did not show source of emboli.  Follow-up TEE this morning did not show any significant abnormality, no evidence of PFO, significant valve disease or left atrial abnormalities. She was not on any antiplatelet or anticoagulant prior to this event so was started on aspirin 81 mg/day. LDL 139.  HgbA1c 5.3.  Plan: 1.Continue medical management with  Aspirin 81 mg/day with intensive management of vascular risk factor to keep systolic BP (SBP) <592 mm Hg (130 mm Hg if diabetic)  2. Statin for lipid management with target  low density lipoprotein (LDL) <70 mg/dl.   This patient was staffed with Dr. Irish Elders, Alease Frame who personally evaluated patient, reviewed documentation and agreed with assessment and plan of care as above.  Rufina Falco, DNP, FNP-BC Board certified Nurse Practitioner Neurology Department    LOS: 1 day

## 2017-12-30 NOTE — Plan of Care (Signed)
  Problem: Education: Goal: Knowledge of disease or condition will improve Outcome: Progressing Goal: Knowledge of secondary prevention will improve Outcome: Progressing Goal: Knowledge of patient specific risk factors addressed and post discharge goals established will improve Outcome: Progressing   Problem: Ischemic Stroke/TIA Tissue Perfusion: Goal: Complications of ischemic stroke/TIA will be minimized Outcome: Progressing   Problem: Ischemic Stroke/TIA Tissue Perfusion: Goal: Complications of ischemic stroke/TIA will be minimized Outcome: Progressing   Problem: Health Behavior/Discharge Planning: Goal: Ability to manage health-related needs will improve Outcome: Progressing   Problem: Clinical Measurements: Goal: Ability to maintain clinical measurements within normal limits will improve Outcome: Progressing Goal: Will remain free from infection Outcome: Progressing Goal: Diagnostic test results will improve Outcome: Progressing   Problem: Safety: Goal: Ability to remain free from injury will improve Outcome: Progressing   Problem: Skin Integrity: Goal: Risk for impaired skin integrity will decrease Outcome: Progressing

## 2017-12-30 NOTE — Progress Notes (Signed)
Received MD order to discharge patient to home reviewed homes meds, prescriptions, discharge instructions and follow up appoints with patient and she verbalized understanding

## 2017-12-30 NOTE — Procedures (Signed)
   TRANSESOPHAGEAL ECHOCARDIOGRAM   NAME:  Amanda Osborn   MRN: [id number] DOB:  24-Sep-1955   ADMIT DATE: 12/28/2017  INDICATIONS:   PROCEDURE:   Informed consent was obtained prior to the procedure. The risks, benefits and alternatives for the procedure were discussed and the patient comprehended these risks.  Risks include, but are not limited to, cough, sore throat, vomiting, nausea, somnolence, esophageal and stomach trauma or perforation, bleeding, low blood pressure, aspiration, pneumonia, infection, trauma to the teeth and death.    After a procedural time-out, the patient was given 3 mg versed and 75 mcg fentanyl to achieve moderate sedation for 13 min.  The oropharynx was anesthetized 2 cc of topical 1% viscous lidocaine.  The transesophageal probe was inserted in the esophagus and stomach without difficulty and multiple views were obtained. I was present for the entire procedure.    COMPLICATIONS:    There were no immediate complications.  FINDINGS:  LEFT VENTRICLE: EF = 55%. No regional wall motion abnormalities.  RIGHT VENTRICLE: Normal size and function.   LEFT ATRIUM: Left atrial size was normal.  No thrombi were noted.  Left atrial appendage was well visualized with no thrombus.  There was good Doppler flow.  LEFT ATRIAL APPENDAGE: No thrombus.   RIGHT ATRIUM: Right usual size was normal.  No thrombi.  AORTIC VALVE:  Trileaflet.  No vegetations or thrombi.  Trivial AI.  MITRAL VALVE:    Normal.  No vegetations or thrombi.  Trivial to mitral regurgitation.  TRICUSPID VALVE: Normal.  No vegetations or thrombi.  Trivial TR  PULMONIC VALVE: Grossly normal.  INTERATRIAL SEPTUM: No PFO or ASD. Agitated saline contrast was used.   PERICARDIUM: No effusion  DESCENDING AORTA: Normal size.   CONCLUSION: Transesophageal echo showed no evidence of cardiac source of emboli.  No significant valvular abnormalities.  Interatrial septum is intact both by color flow  Doppler and agitated saline contrast.

## 2017-12-30 NOTE — Progress Notes (Signed)
*  PRELIMINARY RESULTS* Echocardiogram Echocardiogram Transesophageal has been performed.  Amanda Osborn 12/30/2017, 10:00 AM

## 2017-12-31 ENCOUNTER — Encounter: Payer: Self-pay | Admitting: Cardiology

## 2018-01-02 ENCOUNTER — Telehealth: Payer: Self-pay

## 2018-01-02 NOTE — Telephone Encounter (Signed)
TOC #1. Called pt to f/u after d/c from [facility] on 12/30/17. Also wanted to confirm the patient's hosp f/u appt w/ Dr. Ronnald Ramp on 01/13/18 @ 9:20am. Discharge planning includes the following:  - Begin Crestor and ASA - Continue heart healthy diet - F/u Dr. Ronnald Ramp as scheduled - F/u Dr. Melrose Nakayama  LVM's on both home and cell numbers requesting returned call.

## 2018-01-02 NOTE — Telephone Encounter (Signed)
Transition Care Management Follow-up Telephone Call  Date of discharge and from where: 12/31/18 from [facility]  How have you been since you were released from the hospital? States she is feeling much better. Had a headached while in the hosp and once she got homw but it has gotten better. Denies any numbness/tingling of extremities or slurred speech.  Any questions or concerns? No   Items Reviewed:  Did the pt receive and understand the discharge instructions provided? Yes   Medications obtained and verified? Yes   Any new allergies since your discharge? No   Dietary orders reviewed? Yes  Do you have support at home? Yes  Other (ie: DME, Home Health, etc) N/A  Functional Questionnaire: (I = Independent and D = Dependent) ADL's: I  Bathing/Dressing- I   Meal Prep- I  Eating- I  Maintaining continence- I  Transferring/Ambulation- I  Managing Meds- I   Follow up appointments reviewed:    PCP Hospital f/u appt confirmed? Yes  Scheduled to see Dr. Ronnald Ramp on 01/13/18 @ 9:40am.  [facility] f/u appt confirmed? Yes  Scheduled to see Dr. Melrose Nakayama in Dec. Advised she call to speak with his nurse re: changing this appt  Are transportation arrangements needed? No   If their condition worsens, is the pt aware to call  their PCP or go to the ED? Yes  Was the patient provided with contact information for the PCP's office or ED? Yes  Was the pt encouraged to call back with questions or concerns? Yes

## 2018-01-05 DIAGNOSIS — F411 Generalized anxiety disorder: Secondary | ICD-10-CM | POA: Diagnosis not present

## 2018-01-05 DIAGNOSIS — F5105 Insomnia due to other mental disorder: Secondary | ICD-10-CM | POA: Diagnosis not present

## 2018-01-11 DIAGNOSIS — R079 Chest pain, unspecified: Secondary | ICD-10-CM | POA: Diagnosis not present

## 2018-01-11 DIAGNOSIS — G43109 Migraine with aura, not intractable, without status migrainosus: Secondary | ICD-10-CM | POA: Diagnosis not present

## 2018-01-11 DIAGNOSIS — Z8673 Personal history of transient ischemic attack (TIA), and cerebral infarction without residual deficits: Secondary | ICD-10-CM | POA: Diagnosis not present

## 2018-01-11 DIAGNOSIS — F5104 Psychophysiologic insomnia: Secondary | ICD-10-CM | POA: Diagnosis not present

## 2018-01-12 DIAGNOSIS — I639 Cerebral infarction, unspecified: Secondary | ICD-10-CM | POA: Diagnosis not present

## 2018-01-13 ENCOUNTER — Encounter: Payer: Self-pay | Admitting: Family Medicine

## 2018-01-13 ENCOUNTER — Ambulatory Visit: Payer: BLUE CROSS/BLUE SHIELD | Admitting: Family Medicine

## 2018-01-13 VITALS — BP 120/70 | HR 76 | Ht 63.0 in | Wt 103.0 lb

## 2018-01-13 DIAGNOSIS — F324 Major depressive disorder, single episode, in partial remission: Secondary | ICD-10-CM

## 2018-01-13 DIAGNOSIS — Z09 Encounter for follow-up examination after completed treatment for conditions other than malignant neoplasm: Secondary | ICD-10-CM

## 2018-01-13 MED ORDER — SERTRALINE HCL 50 MG PO TABS: 50.0000 mg | ORAL_TABLET | Freq: Every day | ORAL | 1 refills | Status: DC

## 2018-01-13 MED ORDER — TRAZODONE HCL 50 MG PO TABS: 50.0000 mg | ORAL_TABLET | Freq: Every day | ORAL | 1 refills | Status: DC

## 2018-01-13 NOTE — Progress Notes (Signed)
Date:  01/13/2018   Name:  Amanda Osborn   DOB:  25-Aug-1955   MRN:  [id number]   Chief Complaint: Follow-up (hospital d/c on 12/30/17- Dx with Ischemic stroke) and tdap Pt was recently admitted to [facility] on 12/28/17 for thrombotic cva and was discharged on 12/30/17. Transition of care call placed on 01/02/18.   Neurologic Problem  The patient's primary symptoms include clumsiness and a loss of balance. The patient's pertinent negatives include no altered mental status, focal sensory loss, focal weakness, memory loss, near-syncope, slurred speech, syncope, visual change or weakness. Primary symptoms comment: cereb vasc dis. This is a recurrent problem. The current episode started more than 1 year ago. The neurological problem developed gradually. The problem has been waxing and waning since onset. Associated symptoms include dizziness, headaches and neck pain. Pertinent negatives include no abdominal pain, back pain, fatigue, fever, light-headedness, nausea, palpitations, shortness of breath or vomiting. (Visual disturbance) The treatment provided moderate relief. Her past medical history is significant for a CVA. There is no history of a bleeding disorder, a clotting disorder, dementia, head trauma, liver disease, mood changes or seizures.     Review of Systems  Constitutional: Negative.  Negative for chills, fatigue, fever and unexpected weight change.  HENT: Negative for congestion, ear discharge, ear pain, rhinorrhea, sinus pressure, sneezing and sore throat.   Eyes: Negative for photophobia, pain, discharge, redness and itching.  Respiratory: Negative for cough, shortness of breath, wheezing and stridor.   Cardiovascular: Negative for palpitations and near-syncope.  Gastrointestinal: Negative for abdominal pain, blood in stool, constipation, diarrhea, nausea and vomiting.  Endocrine: Negative for cold intolerance, heat intolerance, polydipsia, polyphagia and polyuria.    Genitourinary: Negative for dysuria, flank pain, frequency, hematuria, menstrual problem, pelvic pain, urgency, vaginal bleeding and vaginal discharge.  Musculoskeletal: Positive for neck pain. Negative for arthralgias, back pain and myalgias.  Skin: Negative for rash.  Allergic/Immunologic: Negative for environmental allergies and food allergies.  Neurological: Positive for dizziness, headaches and loss of balance. Negative for focal weakness, syncope, weakness, light-headedness and numbness.  Hematological: Negative for adenopathy. Does not bruise/bleed easily.  Psychiatric/Behavioral: Negative for dysphoric mood and memory loss. The patient is not nervous/anxious.     Patient Active Problem List   Diagnosis Date Noted  . CVA (cerebral vascular accident) ([place]) 12/29/2017  . Slurred speech 12/28/2017  . Malignant neoplastic disease ([place]) 03/18/2015  . Chondromalacia of patella 09/13/2014  . Bursitis 09/13/2014  . Allergic rhinitis 09/13/2014  . Cancer ([place]) 09/13/2014  . Cervical disc disease 09/13/2014    Allergies  Allergen Reactions  . Montelukast Sodium     Past Surgical History:  Procedure Laterality Date  . AUGMENTATION MAMMAPLASTY Bilateral 2011  . BREAST CYST ASPIRATION Bilateral   . BREAST EXCISIONAL BIOPSY Left 2006   breast ca  . BREAST LUMPECTOMY    . CARDIAC CATHETERIZATION    . COLONOSCOPY    . COLONOSCOPY WITH PROPOFOL N/A 06/28/2016   Procedure: COLONOSCOPY WITH PROPOFOL;  Surgeon: Lollie Sails, MD;  Location[facility] ENDOSCOPY;  Service: Endoscopy;  Laterality: N/A;  . MASTECTOMY Bilateral   . TEE WITHOUT CARDIOVERSION N/A 12/30/2017   Procedure: TRANSESOPHAGEAL ECHOCARDIOGRAM (TEE);  Surgeon: Teodoro Spray, MD;  Location: [facility] ORS;  Service: Cardiovascular;  Laterality: N/A;  . VAGINAL HYSTERECTOMY      Social History   Tobacco Use  . Smoking status: Never Smoker  . Smokeless tobacco: Never Used  Substance Use Topics  . Alcohol use: No  Alcohol/week: 0.0 standard drinks  . Drug use: No     Medication list has been reviewed and updated.  Current Meds  Medication Sig  . acyclovir (ZOVIRAX) 400 MG tablet Take 400 mg by mouth 2 (two) times daily.  Marland Kitchen aspirin EC 325 MG EC tablet Take 1 tablet (325 mg total) by mouth daily.  Marland Kitchen ibandronate (BONIVA) 150 MG tablet Take 1 tablet by mouth every 30 (thirty) days.  . meloxicam (MOBIC) 7.5 MG tablet Take 7.5 mg daily by mouth. Reche Dixon  . Multiple Vitamin (MULTI-VITAMINS) TABS Take 1 tablet by mouth daily.  . rosuvastatin (CRESTOR) 20 MG tablet Take 1 tablet (20 mg total) by mouth daily.  . sertraline (ZOLOFT) 50 MG tablet Take 50 mg by mouth daily.   . traZODone (DESYREL) 50 MG tablet Take 50-100 mg by mouth at bedtime.     PHQ 2/9 Scores 01/13/2018 02/14/2017  PHQ - 2 Score 0 0  PHQ- 9 Score 3 0    Physical Exam  Constitutional: She is oriented to person, place, and time. She appears well-developed and well-nourished.  HENT:  Head: Normocephalic.  Right Ear: External ear normal.  Left Ear: External ear normal.  Mouth/Throat: Oropharynx is clear and moist.  Eyes: Pupils are equal, round, and reactive to light. Conjunctivae and EOM are normal. Lids are everted and swept, no foreign bodies found. Left eye exhibits no hordeolum. No foreign body present in the left eye. Right conjunctiva is not injected. Left conjunctiva is not injected. No scleral icterus.  Neck: Normal range of motion. Neck supple. No JVD present. No tracheal deviation present. No thyromegaly present.  Cardiovascular: Normal rate, regular rhythm, normal heart sounds and intact distal pulses. Exam reveals no gallop and no friction rub.  No murmur heard. Pulmonary/Chest: Effort normal and breath sounds normal. No respiratory distress. She has no wheezes. She has no rales.  Abdominal: Soft. Bowel sounds are normal. She exhibits no mass. There is no hepatosplenomegaly. There is no tenderness. There is no rebound and  no guarding.  Musculoskeletal: Normal range of motion. She exhibits no edema or tenderness.  Lymphadenopathy:    She has no cervical adenopathy.  Neurological: She is alert and oriented to person, place, and time. She has normal strength. She displays normal reflexes. No cranial nerve deficit.  Skin: Skin is warm. No rash noted.  Psychiatric: She has a normal mood and affect. Her mood appears not anxious. She does not exhibit a depressed mood.  Nursing note and vitals reviewed.   BP 120/70   Pulse 76   Ht 5\' 3"  (1.6 m)   Wt 103 lb (46.7 kg)   BMI 18.25 kg/m   Assessment and Plan:  1. Hospital discharge follow-up Transition to Care for previous hospitalization for CVA  2. Major depressive disorder in partial remission, unspecified whether recurrent (HCC) Chronic Uncontrolled Continue sertraline for depression and Trazodone for insomnia. - sertraline (ZOLOFT) 50 MG tablet; Take 1 tablet (50 mg total) by mouth daily.  Dispense: 90 tablet; Refill: 1 - traZODone (DESYREL) 50 MG tablet; Take 1-2 tablets (50-100 mg total) by mouth at bedtime.  Dispense: 90 tablet; Refill: 1   Dr. Macon [facility]  01/13/2018

## 2018-01-22 IMAGING — MR MR BILATERAL BREAST WITHOUT AND WITH CONTRAST
6 of 11 series · 31 of 48 positions shown · IV contrast (9ml Multihance)
Comparison: Previous breast MRIs, most recent dated 01/23/2016.
Prior mammograms.

CLINICAL DATA: Personal hx of breast cancer. Lumpectomy in 3676,
followed by a bilateral mastectomy in 7088. Currently having left
sided pain off and on. Per patient looking at a lymph node.
Grandmother and aunt ages 40 and 65 had breast cancer

LABS:  Creatinine was obtained on site at [HOSPITAL] at [REDACTED] [HOSPITAL].
Results: Creatinine 0.7 mg/dL.  GFR 92
EXAM:
BILATERAL BREAST MRI WITH AND WITHOUT CONTRAST
TECHNIQUE: Multiplanar, multisequence MR images of both breasts were obtained
prior to and following the intravenous administration of 9 ml of
MultiHance.

[Series 3: t2_tirm_tra ipat (a-p) · axial · 3.0mm · 0.62mm/px · z∈[-73,+89]mm · 2 of 55 slices shown (1 of 2)]
[im 1/55]
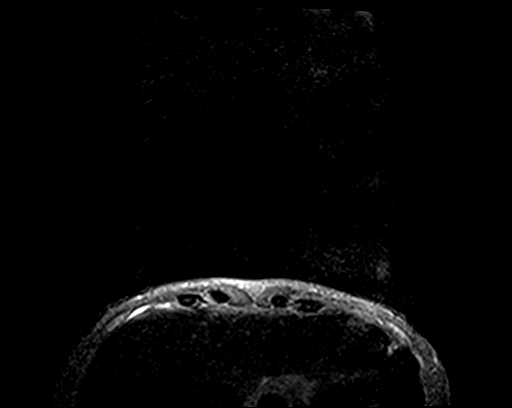
[im 55/55]
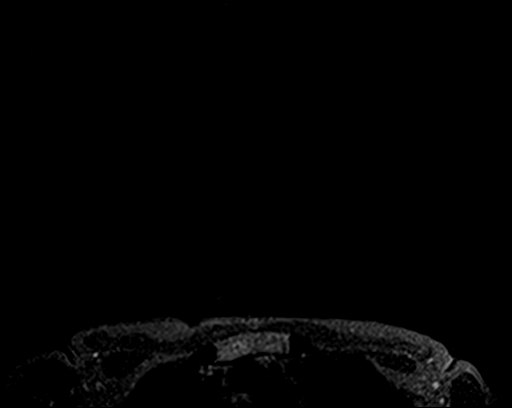

[Series 4: fl3d pre-cm no · axial · non-contrast · 1.2mm · 0.83mm/px · z∈[-77,+94]mm · 6 of 144 slices shown]
[im 1/144]
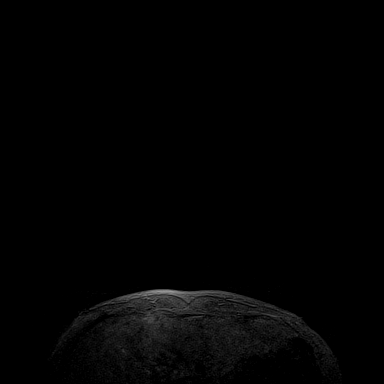
[im 29/144]
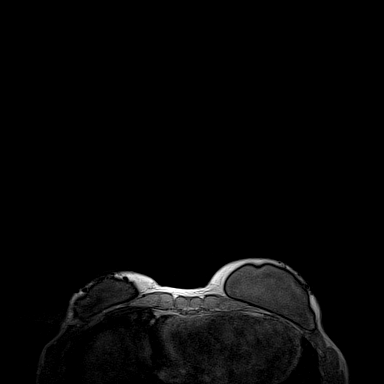
[im 58/144]
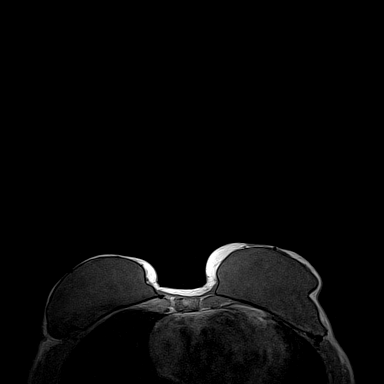
[im 86/144]
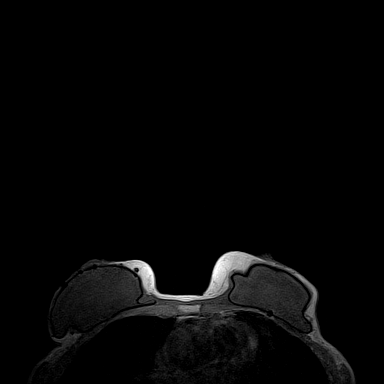
[im 115/144]
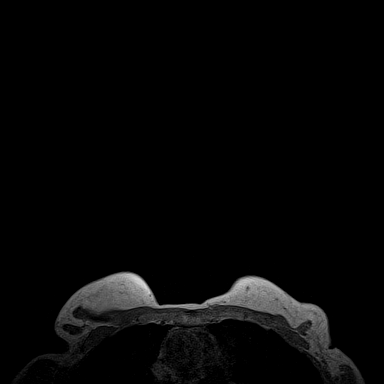
[im 144/144]
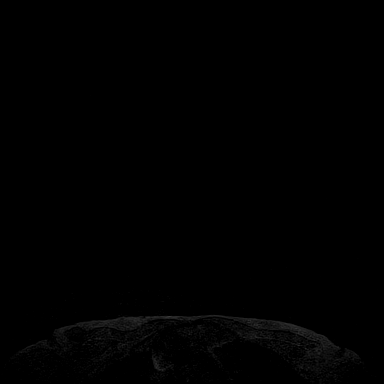

[Series 5: t2_tirm_tra ipat (a-p) · axial · 3.0mm · 0.62mm/px · z∈[-73,+89]mm · 2 of 55 slices shown (2 of 2)]
[im 1/55]
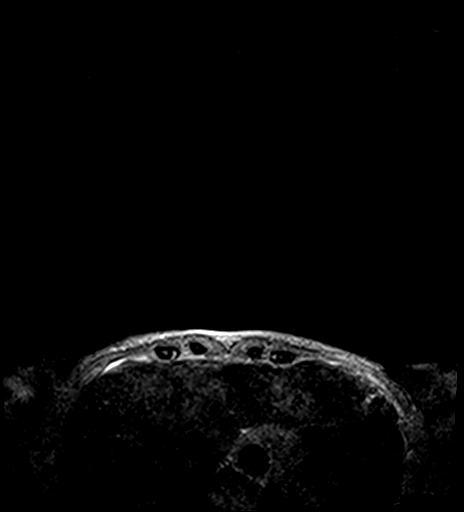
[im 55/55]
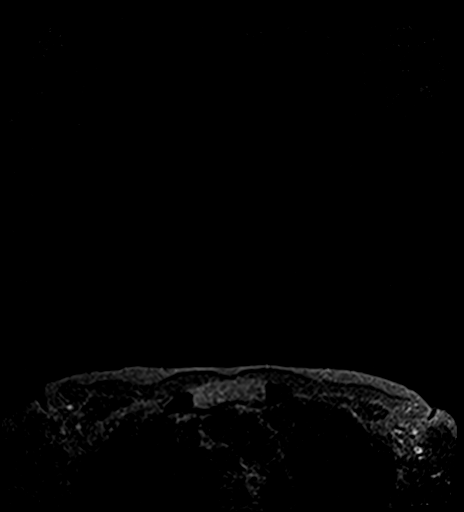

[Series 6: fl3d pre-cm · axial · non-contrast · 1.2mm · 0.83mm/px · z∈[-77,+94]mm · 7 of 144 slices shown]
[im 1/144]
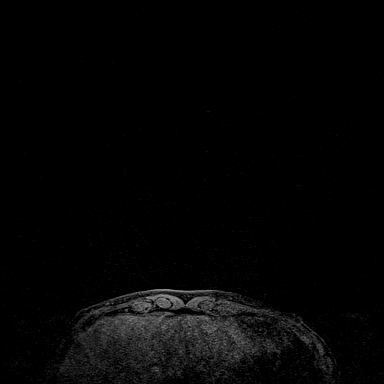
[im 24/144]
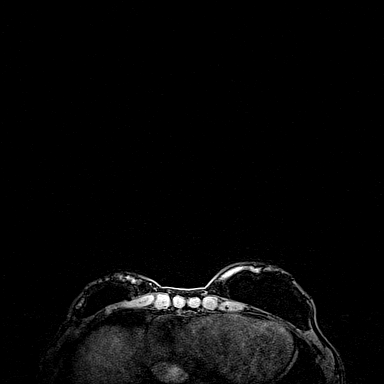
[im 48/144]
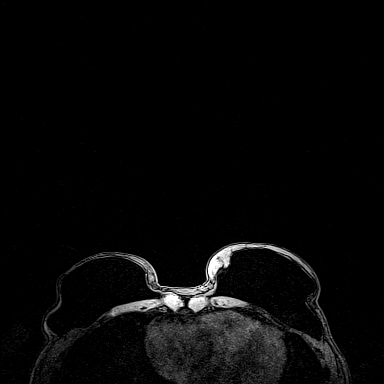
[im 72/144]
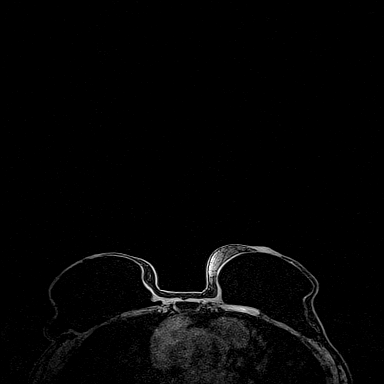
[im 96/144]
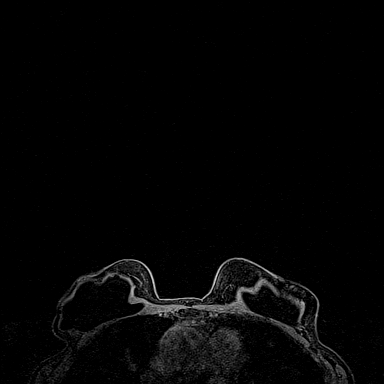
[im 120/144]
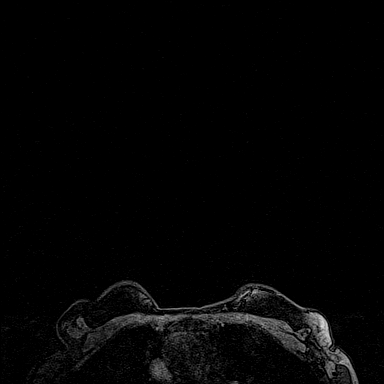
[im 144/144]
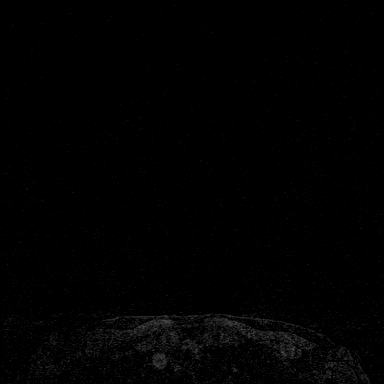

[Series 7: fl3d post-cm 20 · axial · 1.2mm · 0.83mm/px · z∈[-77,+94]mm · 7 of 144 slices shown (1 of 2)]
[im 1/144]
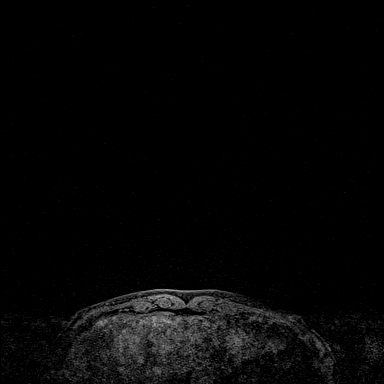
[im 24/144]
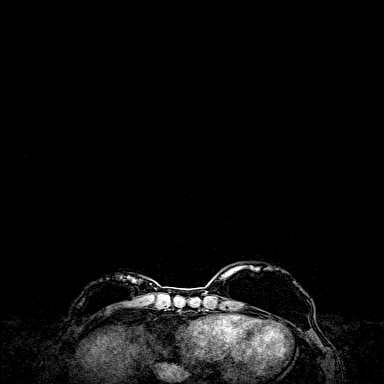
[im 48/144]
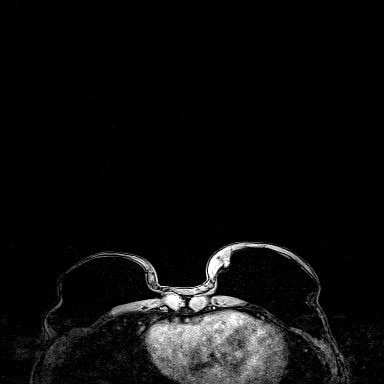
[im 72/144]
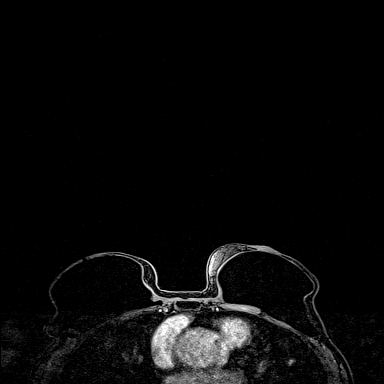
[im 96/144]
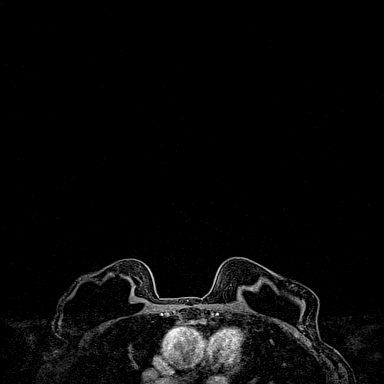
[im 120/144]
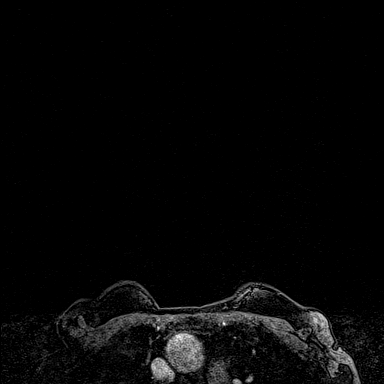
[im 144/144]
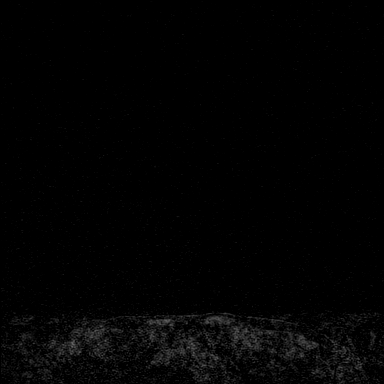

[Series 8: fl3d post-cm 20 · axial · 1.2mm · 0.83mm/px · z∈[-77,+94]mm · 7 of 144 slices shown (2 of 2)]
[im 1/144]
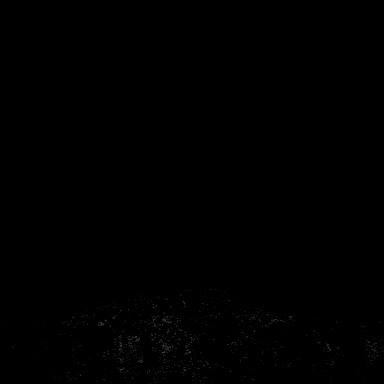
[im 24/144]
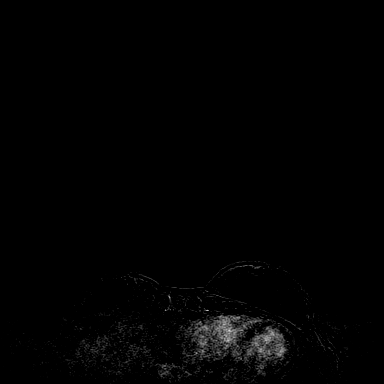
[im 48/144]
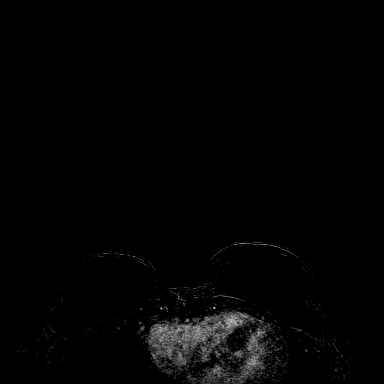
[im 72/144]
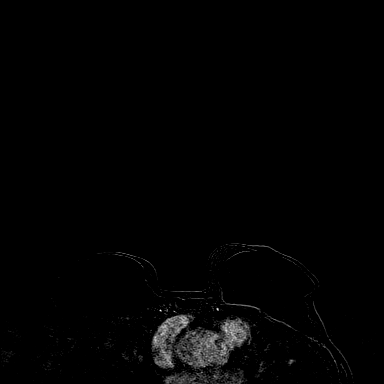
[im 96/144]
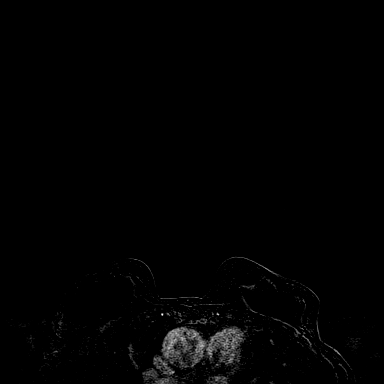
[im 120/144]
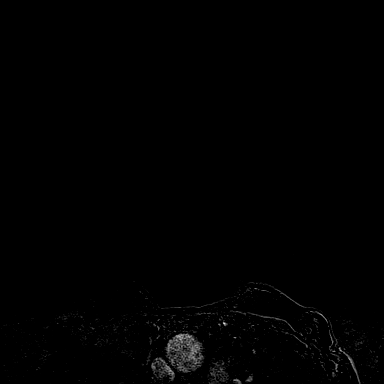
[im 144/144]
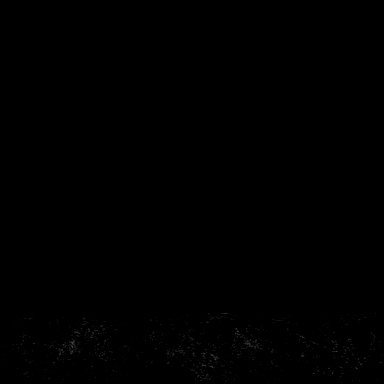

[31 of 48 positions shown; findings below may reference images not displayed]

THREE-DIMENSIONAL MR IMAGE RENDERING ON INDEPENDENT WORKSTATION:

Three-dimensional MR images were rendered by post-processing of the
original MR data on an independent workstation. The
three-dimensional MR images were interpreted, and findings are
reported in the following complete MRI report for this study. Three
dimensional images were evaluated at the independent [employer]
workstation
FINDINGS: Breast composition: a. Almost entirely fat.

Background parenchymal enhancement: Minimal

Right breast: No mass or abnormal enhancement noted in the residual
subcutaneous tissue. Retropectoral silicone implant is intact.

Left breast: No mass or abnormal enhancement noted in the residual
subcutaneous tissue. Retro pectoral silicone implant is intact.

Lymph nodes: No enlarged or abnormal appearing lymph nodes. No
abnormal signal or enhancement in either axilla.

Ancillary findings:  None.
IMPRESSION: 1. No evidence of recurrent or new breast malignancy.
2. No enlarged or abnormal appearing axillary lymph nodes.
3. Intact retropectoral implants.

RECOMMENDATION:
Clinical followup for the symptoms of left sided pain.

BI-RADS CATEGORY  1: Negative.

## 2018-01-23 DIAGNOSIS — R42 Dizziness and giddiness: Secondary | ICD-10-CM | POA: Diagnosis not present

## 2018-01-23 DIAGNOSIS — I639 Cerebral infarction, unspecified: Secondary | ICD-10-CM | POA: Diagnosis not present

## 2018-01-23 DIAGNOSIS — R0602 Shortness of breath: Secondary | ICD-10-CM | POA: Diagnosis not present

## 2018-01-23 DIAGNOSIS — I208 Other forms of angina pectoris: Secondary | ICD-10-CM | POA: Diagnosis not present

## 2018-01-27 ENCOUNTER — Other Ambulatory Visit: Payer: Self-pay

## 2018-01-27 MED ORDER — ROSUVASTATIN CALCIUM 20 MG PO TABS: 20.0000 mg | ORAL_TABLET | Freq: Every day | ORAL | 1 refills | Status: DC

## 2018-01-31 ENCOUNTER — Other Ambulatory Visit: Payer: Self-pay | Admitting: Obstetrics and Gynecology

## 2018-01-31 DIAGNOSIS — Z1331 Encounter for screening for depression: Secondary | ICD-10-CM | POA: Diagnosis not present

## 2018-01-31 DIAGNOSIS — N644 Mastodynia: Secondary | ICD-10-CM | POA: Diagnosis not present

## 2018-01-31 DIAGNOSIS — Z01419 Encounter for gynecological examination (general) (routine) without abnormal findings: Secondary | ICD-10-CM | POA: Diagnosis not present

## 2018-01-31 DIAGNOSIS — M810 Age-related osteoporosis without current pathological fracture: Secondary | ICD-10-CM | POA: Diagnosis not present

## 2018-01-31 DIAGNOSIS — Z8659 Personal history of other mental and behavioral disorders: Secondary | ICD-10-CM | POA: Diagnosis not present

## 2018-01-31 DIAGNOSIS — Z1239 Encounter for other screening for malignant neoplasm of breast: Secondary | ICD-10-CM

## 2018-01-31 DIAGNOSIS — Z853 Personal history of malignant neoplasm of breast: Secondary | ICD-10-CM

## 2018-01-31 DIAGNOSIS — N941 Unspecified dyspareunia: Secondary | ICD-10-CM | POA: Diagnosis not present

## 2018-02-17 ENCOUNTER — Other Ambulatory Visit: Payer: BLUE CROSS/BLUE SHIELD

## 2018-02-18 ENCOUNTER — Other Ambulatory Visit: Payer: Self-pay | Admitting: Family Medicine

## 2018-02-24 ENCOUNTER — Ambulatory Visit
Admission: RE | Admit: 2018-02-24 | Discharge: 2018-02-24 | Disposition: A | Discharge status: Home / Self Care | Payer: BLUE CROSS/BLUE SHIELD | Source: Ambulatory Visit | Attending: Obstetrics and Gynecology | Admitting: Obstetrics and Gynecology

## 2018-02-24 DIAGNOSIS — Z853 Personal history of malignant neoplasm of breast: Secondary | ICD-10-CM | POA: Diagnosis not present

## 2018-02-24 DIAGNOSIS — Z1239 Encounter for other screening for malignant neoplasm of breast: Secondary | ICD-10-CM

## 2018-02-24 MED ORDER — GADOBUTROL 1 MMOL/ML IV SOLN
5.0000 mL | Freq: Once | INTRAVENOUS | Status: AC | PRN
  Administered 2018-02-24: 5 mL via INTRAVENOUS

## 2018-03-02 ENCOUNTER — Other Ambulatory Visit: Payer: Self-pay

## 2018-03-02 ENCOUNTER — Telehealth: Payer: Self-pay

## 2018-03-02 MED ORDER — ROSUVASTATIN CALCIUM 20 MG PO TABS: 20.0000 mg | ORAL_TABLET | Freq: Every day | ORAL | 0 refills | Status: DC

## 2018-03-02 NOTE — Telephone Encounter (Signed)
Pt called requesting a refill on crestor to be sent to [facility] for mail order. Sent in today

## 2018-03-03 DIAGNOSIS — F411 Generalized anxiety disorder: Secondary | ICD-10-CM | POA: Diagnosis not present

## 2018-03-03 DIAGNOSIS — F5105 Insomnia due to other mental disorder: Secondary | ICD-10-CM | POA: Diagnosis not present

## 2018-03-23 ENCOUNTER — Other Ambulatory Visit: Payer: Self-pay | Admitting: Family Medicine

## 2018-04-20 DIAGNOSIS — G43109 Migraine with aura, not intractable, without status migrainosus: Secondary | ICD-10-CM | POA: Diagnosis not present

## 2018-04-20 DIAGNOSIS — F5104 Psychophysiologic insomnia: Secondary | ICD-10-CM | POA: Diagnosis not present

## 2018-04-20 DIAGNOSIS — Z8673 Personal history of transient ischemic attack (TIA), and cerebral infarction without residual deficits: Secondary | ICD-10-CM | POA: Diagnosis not present

## 2018-04-20 DIAGNOSIS — R42 Dizziness and giddiness: Secondary | ICD-10-CM | POA: Diagnosis not present

## 2018-05-13 ENCOUNTER — Other Ambulatory Visit: Payer: Self-pay | Admitting: Family Medicine

## 2018-05-17 ENCOUNTER — Encounter: Payer: Self-pay | Admitting: Family Medicine

## 2018-05-17 ENCOUNTER — Ambulatory Visit: Payer: BLUE CROSS/BLUE SHIELD | Admitting: Family Medicine

## 2018-05-17 ENCOUNTER — Other Ambulatory Visit: Payer: Self-pay

## 2018-05-17 VITALS — BP 102/76 | HR 68 | Resp 16 | Ht 63.0 in | Wt 105.0 lb

## 2018-05-17 DIAGNOSIS — E782 Mixed hyperlipidemia: Secondary | ICD-10-CM | POA: Diagnosis not present

## 2018-05-17 DIAGNOSIS — R5383 Other fatigue: Secondary | ICD-10-CM | POA: Diagnosis not present

## 2018-05-17 DIAGNOSIS — F324 Major depressive disorder, single episode, in partial remission: Secondary | ICD-10-CM

## 2018-05-17 MED ORDER — TRAZODONE HCL 50 MG PO TABS: 50.0000 mg | ORAL_TABLET | Freq: Every day | ORAL | 1 refills | Status: DC

## 2018-05-17 MED ORDER — SERTRALINE HCL 50 MG PO TABS: 50.0000 mg | ORAL_TABLET | Freq: Every day | ORAL | 1 refills | Status: DC

## 2018-05-17 MED ORDER — ROSUVASTATIN CALCIUM 20 MG PO TABS: 20.0000 mg | ORAL_TABLET | Freq: Every day | ORAL | 1 refills | Status: DC

## 2018-05-17 NOTE — Progress Notes (Signed)
Date:  05/17/2018   Name:  Amanda Osborn   DOB:  1955-06-18   MRN:  [id number]   Chief Complaint: Hyperlipidemia and Depression  Hyperlipidemia  This is a chronic problem. The current episode started more than 1 year ago. The problem is controlled. Recent lipid tests were reviewed and are normal. She has no history of chronic renal disease, diabetes, hypothyroidism, liver disease, obesity or nephrotic syndrome. Factors aggravating her hyperlipidemia include thiazides. Pertinent negatives include no chest pain, focal sensory loss, focal weakness, leg pain, myalgias or shortness of breath. Current antihyperlipidemic treatment includes statins (crestor). The current treatment provides moderate improvement of lipids. There are no compliance problems.   Depression         This is a chronic problem.  The current episode started more than 1 year ago.   The onset quality is gradual.   The problem occurs daily.  The problem has been waxing and waning since onset.  Associated symptoms include fatigue and insomnia.  Associated symptoms include no decreased concentration, no helplessness, no hopelessness, not irritable, no restlessness, no decreased interest, no appetite change, no body aches, no myalgias, no headaches, no indigestion, not sad and no suicidal ideas.     The symptoms are aggravated by nothing.  Past treatments include SSRIs - Selective serotonin reuptake inhibitors.  Compliance with treatment is poor.  Previous treatment provided mild relief.  Past medical history includes thyroid problem.     Pertinent negatives include no chronic fatigue syndrome, no chronic pain, no fibromyalgia and no hypothyroidism. Insomnia  Primary symptoms: no fragmented sleep, no sleep disturbance, no difficulty falling asleep, no somnolence, no frequent awakening, no premature morning awakening, no malaise/fatigue, no napping.   The onset quality is gradual. The problem is unchanged. The symptoms are aggravated by  SSRI use. PMH includes: depression.  Thyroid Problem  Presents for initial visit. Symptoms include anxiety and fatigue. Patient reports no cold intolerance, constipation, depressed mood, diaphoresis, diarrhea, dry skin, hair loss, heat intolerance, hoarse voice, menstrual problem, nail problem, palpitations, tremors, visual change, weight gain or weight loss. The symptoms have been worsening. Her past medical history is significant for hyperlipidemia. There is no history of diabetes.    Review of Systems  Constitutional: Positive for fatigue. Negative for appetite change, chills, diaphoresis, fever, malaise/fatigue, unexpected weight change, weight gain and weight loss.  HENT: Negative for congestion, ear discharge, ear pain, hoarse voice, rhinorrhea, sinus pressure, sneezing and sore throat.   Eyes: Negative for photophobia, pain, discharge, redness and itching.  Respiratory: Negative for cough, choking, chest tightness, shortness of breath, wheezing and stridor.   Cardiovascular: Negative for chest pain, palpitations and leg swelling.  Gastrointestinal: Negative for abdominal pain, blood in stool, constipation, diarrhea, nausea and vomiting.  Endocrine: Negative for cold intolerance, heat intolerance, polydipsia, polyphagia and polyuria.  Genitourinary: Negative for dysuria, flank pain, frequency, hematuria, menstrual problem, pelvic pain, urgency, vaginal bleeding and vaginal discharge.  Musculoskeletal: Negative for arthralgias, back pain and myalgias.  Skin: Negative for rash.  Allergic/Immunologic: Negative for environmental allergies and food allergies.  Neurological: Positive for dizziness. Negative for tremors, focal weakness, weakness, light-headedness, numbness and headaches.  Hematological: Negative for adenopathy. Does not bruise/bleed easily.  Psychiatric/Behavioral: Positive for depression. Negative for decreased concentration, dysphoric mood, sleep disturbance and suicidal ideas.  The patient is nervous/anxious and has insomnia.     Patient Active Problem List   Diagnosis Date Noted  . CVA (cerebral vascular accident) ([place]) 12/29/2017  . Slurred  speech 12/28/2017  . Malignant neoplastic disease ([place]) 03/18/2015  . Chondromalacia of patella 09/13/2014  . Bursitis 09/13/2014  . Allergic rhinitis 09/13/2014  . Cancer ([place]) 09/13/2014  . Cervical disc disease 09/13/2014    Allergies  Allergen Reactions  . Montelukast Sodium     Past Surgical History:  Procedure Laterality Date  . AUGMENTATION MAMMAPLASTY Bilateral 2011  . BREAST CYST ASPIRATION Bilateral   . BREAST EXCISIONAL BIOPSY Left 2006   breast ca  . BREAST LUMPECTOMY    . CARDIAC CATHETERIZATION    . COLONOSCOPY    . COLONOSCOPY WITH PROPOFOL N/A 06/28/2016   Procedure: COLONOSCOPY WITH PROPOFOL;  Surgeon: Lollie Sails, MD;  Location: [facility] ENDOSCOPY;  Service: Endoscopy;  Laterality: N/A;  . MASTECTOMY Bilateral   . TEE WITHOUT CARDIOVERSION N/A 12/30/2017   Procedure: TRANSESOPHAGEAL ECHOCARDIOGRAM (TEE);  Surgeon: Teodoro Spray, MD;  Location: [facility] ORS;  Service: Cardiovascular;  Laterality: N/A;  . VAGINAL HYSTERECTOMY      Social History   Tobacco Use  . Smoking status: Never Smoker  . Smokeless tobacco: Never Used  Substance Use Topics  . Alcohol use: No    Alcohol/week: 0.0 standard drinks  . Drug use: No     Medication list has been reviewed and updated.  Current Meds  Medication Sig  . acyclovir (ZOVIRAX) 400 MG tablet Take 400 mg by mouth 2 (two) times daily.  Marland Kitchen aspirin EC 325 MG EC tablet Take 1 tablet (325 mg total) by mouth daily.  Marland Kitchen ibandronate (BONIVA) 150 MG tablet Take 1 tablet by mouth every 30 (thirty) days.  . meloxicam (MOBIC) 7.5 MG tablet Take 7.5 mg daily by mouth. Reche Dixon  . Multiple Vitamin (MULTI-VITAMINS) TABS Take 1 tablet by mouth daily.  . rosuvastatin (CRESTOR) 20 MG tablet TAKE 1 TABLET DAILY  . sertraline (ZOLOFT) 50 MG tablet Take 1 tablet  (50 mg total) by mouth daily.  . traZODone (DESYREL) 50 MG tablet Take 1-2 tablets (50-100 mg total) by mouth at bedtime.    PHQ 2/9 Scores 05/17/2018 01/13/2018 02/14/2017  PHQ - 2 Score 0 0 0  PHQ- 9 Score 3 3 0    Physical Exam Vitals signs and nursing note reviewed.  Constitutional:      General: She is not irritable.She is not in acute distress.    Appearance: She is not diaphoretic.  HENT:     Head: Normocephalic and atraumatic.     Right Ear: Tympanic membrane, ear canal and external ear normal.     Left Ear: Tympanic membrane, ear canal and external ear normal.     Nose: Nose normal. No congestion or rhinorrhea.  Eyes:     General:        Right eye: No discharge.        Left eye: No discharge.     Extraocular Movements: Extraocular movements intact.     Conjunctiva/sclera: Conjunctivae normal.     Pupils: Pupils are equal, round, and reactive to light.  Neck:     Musculoskeletal: Normal range of motion and neck supple.     Thyroid: No thyromegaly.     Vascular: No JVD.  Cardiovascular:     Rate and Rhythm: Normal rate and regular rhythm.     Pulses: Normal pulses.     Heart sounds: Normal heart sounds. No murmur. No friction rub. No gallop.   Pulmonary:     Effort: Pulmonary effort is normal.     Breath sounds: Normal breath sounds.  No wheezing, rhonchi or rales.  Abdominal:     General: Bowel sounds are normal.     Palpations: Abdomen is soft. There is no mass.     Tenderness: There is no abdominal tenderness. There is no guarding.  Musculoskeletal: Normal range of motion.  Lymphadenopathy:     Cervical: No cervical adenopathy.  Skin:    General: Skin is warm and dry.  Neurological:     General: No focal deficit present.     Mental Status: She is alert.     Cranial Nerves: No cranial nerve deficit.     Deep Tendon Reflexes: Reflexes are normal and symmetric.     BP 102/76   Pulse 68   Resp 16   Ht 5\' 3"  (1.6 m)   Wt 105 lb (47.6 kg)   SpO2 100%   BMI  18.60 kg/m   Assessment and Plan: 1. Major depressive disorder in partial remission, unspecified whether recurrent [facility]) Patient has a history of depression for which she is on Zoloft 50 mg daily.  Patient is relatively stable on this and will continue with present dosing and prescriptions were sent.  Patient also has a history of insomnia for which she takes trazodone 50 mg at night. - traZODone (DESYREL) 50 MG tablet; Take 1-2 tablets (50-100 mg total) by mouth at bedtime.  Dispense: 90 tablet; Refill: 1 - sertraline (ZOLOFT) 50 MG tablet; Take 1 tablet (50 mg total) by mouth daily.  Dispense: 90 tablet; Refill: 1  2. Fatigue, unspecified type Patient has relatively recent history of fatigue with no specific or obvious concern.  Will initiate evaluation of fatigue with renal function panel TSH and hemoglobin. - Renal Function Panel - TSH - Hemoglobin  3. Mixed hyperlipidemia Chronic.  Con trolled.  Will check lipid panel and will continue Crestor 20 mg once a day. - rosuvastatin (CRESTOR) 20 MG tablet; Take 1 tablet (20 mg total) by mouth daily.  Dispense: 90 tablet; Refill: 1 - Lipid panel

## 2018-05-18 LAB — RENAL FUNCTION PANEL
Albumin: 5.1 g/dL — ABNORMAL HIGH (ref 3.8–4.8)
BUN/Creatinine Ratio: 22 (ref 12–28)
BUN: 17 mg/dL (ref 8–27)
CO2: 26 mmol/L (ref 20–29)
Calcium: 9.9 mg/dL (ref 8.7–10.3)
Chloride: 102 mmol/L (ref 96–106)
Creatinine, Ser: 0.77 mg/dL (ref 0.57–1.00)
GFR calc Af Amer: 94 mL/min/{1.73_m2} (ref >59)
GFR calc non Af Amer: 82 mL/min/{1.73_m2} (ref >59)
Glucose: 86 mg/dL (ref 65–99)
Phosphorus: 3.3 mg/dL (ref 3.0–4.3)
Potassium: 4.6 mmol/L (ref 3.5–5.2)
Sodium: 144 mmol/L (ref 134–144)

## 2018-05-18 LAB — LIPID PANEL
Chol/HDL Ratio: 2.1 ratio (ref 0.0–4.4)
Cholesterol, Total: 172 mg/dL (ref 100–199)
HDL: 82 mg/dL (ref >39)
LDL Calculated: 77 mg/dL (ref 0–99)
TRIGLYCERIDES: 64 mg/dL (ref 0–149)
VLDL Cholesterol Cal: 13 mg/dL (ref 5–40)

## 2018-05-18 LAB — HEMOGLOBIN: Hemoglobin: 14.2 g/dL (ref 11.1–15.9)

## 2018-05-18 LAB — TSH: TSH: 1.43 u[IU]/mL (ref 0.450–4.500)

## 2018-06-01 DIAGNOSIS — F5105 Insomnia due to other mental disorder: Secondary | ICD-10-CM | POA: Diagnosis not present

## 2018-06-01 DIAGNOSIS — F411 Generalized anxiety disorder: Secondary | ICD-10-CM | POA: Diagnosis not present

## 2018-06-30 DIAGNOSIS — F5105 Insomnia due to other mental disorder: Secondary | ICD-10-CM | POA: Diagnosis not present

## 2018-06-30 DIAGNOSIS — F411 Generalized anxiety disorder: Secondary | ICD-10-CM | POA: Diagnosis not present

## 2018-08-13 ENCOUNTER — Other Ambulatory Visit: Payer: Self-pay | Admitting: Family Medicine

## 2018-08-13 DIAGNOSIS — E782 Mixed hyperlipidemia: Secondary | ICD-10-CM

## 2018-09-19 DIAGNOSIS — I639 Cerebral infarction, unspecified: Secondary | ICD-10-CM | POA: Diagnosis not present

## 2018-10-27 DIAGNOSIS — F5105 Insomnia due to other mental disorder: Secondary | ICD-10-CM | POA: Diagnosis not present

## 2018-10-27 DIAGNOSIS — F411 Generalized anxiety disorder: Secondary | ICD-10-CM | POA: Diagnosis not present

## 2018-11-10 DIAGNOSIS — F5105 Insomnia due to other mental disorder: Secondary | ICD-10-CM | POA: Diagnosis not present

## 2018-11-10 DIAGNOSIS — F411 Generalized anxiety disorder: Secondary | ICD-10-CM | POA: Diagnosis not present

## 2018-11-11 ENCOUNTER — Other Ambulatory Visit: Payer: Self-pay | Admitting: Family Medicine

## 2018-11-11 DIAGNOSIS — E782 Mixed hyperlipidemia: Secondary | ICD-10-CM

## 2018-12-08 DIAGNOSIS — F411 Generalized anxiety disorder: Secondary | ICD-10-CM | POA: Diagnosis not present

## 2018-12-08 DIAGNOSIS — F5105 Insomnia due to other mental disorder: Secondary | ICD-10-CM | POA: Diagnosis not present

## 2018-12-28 ENCOUNTER — Other Ambulatory Visit: Payer: Self-pay

## 2018-12-28 ENCOUNTER — Ambulatory Visit (INDEPENDENT_AMBULATORY_CARE_PROVIDER_SITE_OTHER): Payer: Medicare Other | Admitting: Family Medicine

## 2018-12-28 ENCOUNTER — Encounter: Payer: Self-pay | Admitting: Family Medicine

## 2018-12-28 VITALS — BP 110/77 | HR 69 | Resp 16 | Ht 63.0 in | Wt 103.4 lb

## 2018-12-28 DIAGNOSIS — E782 Mixed hyperlipidemia: Secondary | ICD-10-CM | POA: Diagnosis not present

## 2018-12-28 DIAGNOSIS — R69 Illness, unspecified: Secondary | ICD-10-CM

## 2018-12-28 DIAGNOSIS — Z23 Encounter for immunization: Secondary | ICD-10-CM

## 2018-12-28 MED ORDER — ROSUVASTATIN CALCIUM 20 MG PO TABS: ORAL_TABLET | ORAL | 3 refills | Status: DC

## 2018-12-28 NOTE — Progress Notes (Signed)
Date:  12/28/2018   Name:  Amanda Osborn   DOB:  Sep 08, 1955   MRN:  [id number]   Chief Complaint: Depression (PHQ9 - 0), Hyperlipidemia (Refills), and prevnar 13  Depression        This is a chronic problem.  The current episode started more than 1 year ago.   The onset quality is gradual.   The problem occurs intermittently.  The problem has been gradually improving since onset.  Associated symptoms include insomnia and sad.  Associated symptoms include no decreased concentration, no fatigue, no helplessness, no hopelessness, not irritable, no restlessness, no decreased interest, no appetite change, no body aches, no myalgias, no headaches, no indigestion and no suicidal ideas.     The symptoms are aggravated by nothing.  Past treatments include other medications (bupropion).  Compliance with treatment is good.  Previous treatment provided moderate relief.  Past medical history includes anxiety.     Pertinent negatives include no hypothyroidism. Anxiety Presents for follow-up visit. Symptoms include excessive worry, insomnia and nervous/anxious behavior. Patient reports no chest pain, compulsions, confusion, decreased concentration, depressed mood, dizziness, dry mouth, nausea, restlessness, shortness of breath or suicidal ideas. Symptoms occur occasionally.    Hyperlipidemia This is a chronic problem. The current episode started more than 1 year ago. The problem is controlled. Recent lipid tests were reviewed and are normal. She has no history of chronic renal disease, diabetes, hypothyroidism, liver disease, obesity or nephrotic syndrome. There are no known factors aggravating her hyperlipidemia. Pertinent negatives include no chest pain, focal sensory loss, focal weakness, leg pain, myalgias or shortness of breath. Current antihyperlipidemic treatment includes statins. The current treatment provides moderate improvement of lipids. There are no compliance problems.  Risk factors for coronary  artery disease include dyslipidemia.    Review of Systems  Constitutional: Negative.  Negative for appetite change, chills, fatigue, fever and unexpected weight change.  HENT: Negative for congestion, ear discharge, ear pain, rhinorrhea, sinus pressure, sneezing and sore throat.   Eyes: Negative for photophobia, pain, discharge, redness and itching.  Respiratory: Negative for cough, shortness of breath, wheezing and stridor.   Cardiovascular: Negative for chest pain.  Gastrointestinal: Negative for abdominal pain, blood in stool, constipation, diarrhea, nausea and vomiting.  Endocrine: Negative for cold intolerance, heat intolerance, polydipsia, polyphagia and polyuria.  Genitourinary: Negative for dysuria, flank pain, frequency, hematuria, menstrual problem, pelvic pain, urgency, vaginal bleeding and vaginal discharge.  Musculoskeletal: Negative for arthralgias, back pain and myalgias.  Skin: Negative for rash.  Allergic/Immunologic: Negative for environmental allergies and food allergies.  Neurological: Negative for dizziness, focal weakness, weakness, light-headedness, numbness and headaches.  Hematological: Negative for adenopathy. Does not bruise/bleed easily.  Psychiatric/Behavioral: Positive for depression. Negative for confusion, decreased concentration, dysphoric mood and suicidal ideas. The patient is nervous/anxious and has insomnia.     Patient Active Problem List   Diagnosis Date Noted  . CVA (cerebral vascular accident) ([place]) 12/29/2017  . Slurred speech 12/28/2017  . Malignant neoplastic disease ([place]) 03/18/2015  . Chondromalacia of patella 09/13/2014  . Bursitis 09/13/2014  . Allergic rhinitis 09/13/2014  . Cancer ([place]) 09/13/2014  . Cervical disc disease 09/13/2014    Allergies  Allergen Reactions  . Montelukast Sodium     Past Surgical History:  Procedure Laterality Date  . AUGMENTATION MAMMAPLASTY Bilateral 2011  . BREAST CYST ASPIRATION Bilateral   .  BREAST EXCISIONAL BIOPSY Left 2006   breast ca  . BREAST LUMPECTOMY    . CARDIAC CATHETERIZATION    .  COLONOSCOPY    . COLONOSCOPY WITH PROPOFOL N/A 06/28/2016   Procedure: COLONOSCOPY WITH PROPOFOL;  Surgeon: Lollie Sails, MD;  Location: [facility] ENDOSCOPY;  Service: Endoscopy;  Laterality: N/A;  . MASTECTOMY Bilateral   . TEE WITHOUT CARDIOVERSION N/A 12/30/2017   Procedure: TRANSESOPHAGEAL ECHOCARDIOGRAM (TEE);  Surgeon: Teodoro Spray, MD;  Location: [facility] ORS;  Service: Cardiovascular;  Laterality: N/A;  . VAGINAL HYSTERECTOMY      Social History   Tobacco Use  . Smoking status: Never Smoker  . Smokeless tobacco: Never Used  Substance Use Topics  . Alcohol use: No    Alcohol/week: 0.0 standard drinks  . Drug use: No     Medication list has been reviewed and updated.  Current Meds  Medication Sig  . acyclovir (ZOVIRAX) 400 MG tablet Take 400 mg by mouth 2 (two) times daily.  Marland Kitchen aspirin EC 325 MG EC tablet Take 1 tablet (325 mg total) by mouth daily.  Marland Kitchen ibandronate (BONIVA) 150 MG tablet Take 1 tablet by mouth every 30 (thirty) days.  . meloxicam (MOBIC) 7.5 MG tablet Take 7.5 mg daily by mouth. Reche Dixon  . Multiple Vitamin (MULTI-VITAMINS) TABS Take 1 tablet by mouth daily.  . rosuvastatin (CRESTOR) 20 MG tablet TAKE 1 TABLET DAILY (NEED APPOINTMENT)  . sertraline (ZOLOFT) 50 MG tablet Take 1 tablet (50 mg total) by mouth daily.  . traZODone (DESYREL) 50 MG tablet Take 1-2 tablets (50-100 mg total) by mouth at bedtime.    PHQ 2/9 Scores 12/28/2018 05/17/2018 01/13/2018 02/14/2017  PHQ - 2 Score 0 0 0 0  PHQ- 9 Score 0 3 3 0    BP Readings from Last 3 Encounters:  12/28/18 110/77  05/17/18 102/76  01/13/18 120/70    Physical Exam Vitals signs and nursing note reviewed.  Constitutional:      General: She is not irritable.    Appearance: She is well-developed.  HENT:     Head: Normocephalic.     Right Ear: Tympanic membrane, ear canal and external ear normal.      Left Ear: Tympanic membrane, ear canal and external ear normal.     Nose: Nose normal.     Mouth/Throat:     Mouth: Mucous membranes are moist.  Eyes:     General: Lids are everted, no foreign bodies appreciated. No scleral icterus.       Left eye: No foreign body or hordeolum.     Conjunctiva/sclera: Conjunctivae normal.     Right eye: Right conjunctiva is not injected.     Left eye: Left conjunctiva is not injected.     Pupils: Pupils are equal, round, and reactive to light.  Neck:     Musculoskeletal: Normal range of motion and neck supple.     Thyroid: No thyromegaly.     Vascular: No JVD.     Trachea: No tracheal deviation.  Cardiovascular:     Rate and Rhythm: Normal rate and regular rhythm.     Heart sounds: Normal heart sounds. No murmur. No friction rub. No gallop.   Pulmonary:     Effort: Pulmonary effort is normal. No respiratory distress.     Breath sounds: Normal breath sounds. No wheezing, rhonchi or rales.  Abdominal:     General: Bowel sounds are normal.     Palpations: Abdomen is soft. There is no mass.     Tenderness: There is no abdominal tenderness. There is no guarding or rebound.  Musculoskeletal: Normal range of motion.  General: No tenderness.  Lymphadenopathy:     Cervical: No cervical adenopathy.  Skin:    General: Skin is warm.     Findings: No rash.  Neurological:     Mental Status: She is alert and oriented to person, place, and time.     Cranial Nerves: No cranial nerve deficit.     Deep Tendon Reflexes: Reflexes normal.  Psychiatric:        Mood and Affect: Mood is not anxious or depressed.     Wt Readings from Last 3 Encounters:  12/28/18 103 lb 6.4 oz (46.9 kg)  05/17/18 105 lb (47.6 kg)  01/13/18 103 lb (46.7 kg)    BP 110/77   Pulse 69   Resp 16   Ht 5\' 3"  (1.6 m)   Wt 103 lb 6.4 oz (46.9 kg)   SpO2 96%   BMI 18.32 kg/m   Assessment and Plan: 1. Mixed hyperlipidemia Chronic.  Controlled.  Continue rosuvastatin 20 mg  once a day.  Will check lipid panel. - rosuvastatin (CRESTOR) 20 MG tablet; TAKE 1 TABLET DAILY (NEED APPOINTMENT)  Dispense: 90 tablet; Refill: 3 - Lipid panel  2. Taking medication for chronic disease Patient on a statin and therefore we will check hepatic function panel rule out hepatotoxicity. - Hepatic Function Panel (6)  3. Need for pneumococcal vaccination Discussed and administered - Pneumococcal conjugate vaccine 13-valent

## 2019-03-02 ENCOUNTER — Other Ambulatory Visit: Payer: Self-pay | Admitting: Obstetrics and Gynecology

## 2019-03-02 DIAGNOSIS — Z1239 Encounter for other screening for malignant neoplasm of breast: Secondary | ICD-10-CM

## 2019-03-19 ENCOUNTER — Ambulatory Visit
Admission: RE | Admit: 2019-03-19 | Discharge: 2019-03-19 | Disposition: A | Discharge status: Home / Self Care | Payer: Medicare Other | Source: Ambulatory Visit | Attending: Obstetrics and Gynecology | Admitting: Obstetrics and Gynecology

## 2019-03-19 ENCOUNTER — Other Ambulatory Visit: Payer: Self-pay

## 2019-03-19 DIAGNOSIS — Z1239 Encounter for other screening for malignant neoplasm of breast: Secondary | ICD-10-CM | POA: Insufficient documentation

## 2019-03-19 LAB — POCT I-STAT CREATININE: Creatinine, Ser: 1 mg/dL (ref 0.44–1.00)

## 2019-03-19 MED ORDER — GADOBUTROL 1 MMOL/ML IV SOLN
5.0000 mL | Freq: Once | INTRAVENOUS | Status: AC | PRN
  Administered 2019-03-19: 5 mL via INTRAVENOUS

## 2019-03-21 ENCOUNTER — Ambulatory Visit (INDEPENDENT_AMBULATORY_CARE_PROVIDER_SITE_OTHER): Payer: Medicare Other | Admitting: Neurology

## 2019-03-21 ENCOUNTER — Other Ambulatory Visit: Payer: Self-pay

## 2019-03-21 ENCOUNTER — Encounter: Payer: Self-pay | Admitting: Neurology

## 2019-03-21 VITALS — BP 119/72 | HR 71 | Temp 97.6°F | Ht 63.0 in | Wt 105.0 lb

## 2019-03-21 DIAGNOSIS — R42 Dizziness and giddiness: Secondary | ICD-10-CM | POA: Diagnosis not present

## 2019-03-21 NOTE — Progress Notes (Signed)
PATIENT: Amanda Osborn DOB: 1955/05/22  Chief Complaint  Patient presents with  . Headache/Hx of CVA    She estimates having some type of head pain 2-3 times per week. The pain varies in presentation - sometimes it is just sudden, sharp pain.  Some linger and require Tylenol which helps.  She will sometimes have dizziness or feel off balance.  States she has history of strokes.  She takes aspirin 325mg  daily.  . OB-GYN    Schermerhorn, Gwen Her, MD - referring provider  . PCP    Juline Patch, MD     HISTORICAL  Amanda Osborn is a 63 year old female, seen in request by her OB/GYN Dr. Lauro Franklin and her primary care physician Dr. Otilio Miu for evaluation of dizziness, initial evaluation was on March 21, 2019.  I have reviewed and summarized the referring note from the referring physician.  She had a past medical history of depression, is on polypharmacy treatment, this includis trazodone 50 mg 2 tablets every night, Zoloft 50 mg daily, Wellbutrin 100 mg every day.  She also has Crestor 20 mg daily.  She presented with intermittent dizziness, she described as unsteady woozy sensation, there was no vertigo, most happen in a sitting up or standing position, never happened in lying down position, she also complains of mildly unsteady gait, mildly decreased hearing bilaterally.  She denies lateralized motor or sensory deficit, reported a history of stroke in September 2019, she had a sudden onset loss of speech lasting for couple hours  I personally reviewed MRI of the brain in September 2019, small focus of abnormal diffusion restriction within the right precentral gyri, likely indicating a small acute infarction, bilateral old cerebellar infarction, normal intracranial MRA  She has been taking aspirin 325 mg daily  There is no orthostatic blood pressure change at today's visit, lying down 128/80, heart rate of 62, sitting up 123/75 heart rate of 61, standing up  120/78 heart rate of 68, ending up for 3 minutes, 24/82, heart rate of 64 REVIEW OF SYSTEMS: Full 14 system review of systems performed and notable only for as above All other review of systems were negative.  ALLERGIES: Allergies  Allergen Reactions  . Montelukast Sodium     Caused headaches    HOME MEDICATIONS: Current Outpatient Medications  Medication Sig Dispense Refill  . acyclovir (ZOVIRAX) 400 MG tablet Take 400 mg by mouth 2 (two) times daily.    Marland Kitchen aspirin EC 325 MG EC tablet Take 1 tablet (325 mg total) by mouth daily. 30 tablet 0  . buPROPion (WELLBUTRIN SR) 100 MG 12 hr tablet Take 100 mg by mouth daily.    Marland Kitchen ibandronate (BONIVA) 150 MG tablet Take 1 tablet by mouth every 30 (thirty) days.    . meloxicam (MOBIC) 7.5 MG tablet Take 7.5 mg daily by mouth. Reche Dixon  1  . Multiple Vitamin (MULTI-VITAMINS) TABS Take 1 tablet by mouth daily.    . rosuvastatin (CRESTOR) 20 MG tablet TAKE 1 TABLET DAILY (NEED APPOINTMENT) 90 tablet 3  . sertraline (ZOLOFT) 50 MG tablet Take 1 tablet (50 mg total) by mouth daily. 90 tablet 1  . traZODone (DESYREL) 50 MG tablet Take 1-2 tablets (50-100 mg total) by mouth at bedtime. 90 tablet 1   No current facility-administered medications for this visit.     PAST MEDICAL HISTORY: Past Medical History:  Diagnosis Date  . Allergy   . Breast cancer ([place])   . Headache   .  History of CVA (cerebrovascular accident)   . Osteoporosis     PAST SURGICAL HISTORY: Past Surgical History:  Procedure Laterality Date  . AUGMENTATION MAMMAPLASTY Bilateral 2011  . BREAST CYST ASPIRATION Bilateral   . BREAST EXCISIONAL BIOPSY Left 2006   breast ca  . BREAST LUMPECTOMY    . CARDIAC CATHETERIZATION    . COLONOSCOPY    . COLONOSCOPY WITH PROPOFOL N/A 06/28/2016   Procedure: COLONOSCOPY WITH PROPOFOL;  Surgeon: Lollie Sails, MD;  [facility] ENDOSCOPY;  Service: Endoscopy;  Laterality: N/A;  . MASTECTOMY Bilateral   . TEE WITHOUT CARDIOVERSION  N/A 12/30/2017   Procedure: TRANSESOPHAGEAL ECHOCARDIOGRAM (TEE);  Surgeon: Teodoro Spray, MD;  Location: [facility] ORS;  Service: Cardiovascular;  Laterality: N/A;  . VAGINAL HYSTERECTOMY      FAMILY HISTORY: Family History  Problem Relation Age of Onset  . Breast cancer Paternal Aunt   . Breast cancer Paternal Grandmother   . Dementia Mother        passed at age 6  . Leukemia Father        passed at age 67  . Leukemia Brother     SOCIAL HISTORY: Social History   Socioeconomic History  . Marital status: Married    Spouse name: Not on file  . Number of children: 2  . Years of education: college  . Highest education level: Bachelor's degree (e.g., BA, AB, BS)  Occupational History  . Occupation: retired from Data processing manager work  Social Needs  . Financial resource strain: Patient refused  . Food insecurity    Worry: Patient refused    Inability: Patient refused  . Transportation needs    Medical: Patient refused    Non-medical: Patient refused  Tobacco Use  . Smoking status: Never Smoker  . Smokeless tobacco: Never Used  Substance and Sexual Activity  . Alcohol use: No    Alcohol/week: 0.0 standard drinks  . Drug use: No  . Sexual activity: Yes  Lifestyle  . Physical activity    Days per week: Patient refused    Minutes per session: Patient refused  . Stress: Patient refused  Relationships  . Social Herbalist on phone: Patient refused    Gets together: Patient refused    Attends religious service: Patient refused    Active member of club or organization: Patient refused    Attends meetings of clubs or organizations: Patient refused    Relationship status: Patient refused  . Intimate partner violence    Fear of current or ex partner: Patient refused    Emotionally abused: Patient refused    Physically abused: Patient refused    Forced sexual activity: Patient refused  Other Topics Concern  . Not on file  Social History Narrative   Lives at home  with her husband, [name].   Right-handed.   No caffeine use.     PHYSICAL EXAM   Vitals:   03/21/19 0816  BP: 119/72  Pulse: 71  Temp: 97.6 F (36.4 C)  Weight: 105 lb (47.6 kg)  Height: 5\' 3"  (1.6 m)    Not recorded      Body mass index is 18.6 kg/m.  PHYSICAL EXAMNIATION:  Gen: NAD, conversant, well nourised, well groomed                     Cardiovascular: Regular rate rhythm, no peripheral edema, warm, nontender. Eyes: Conjunctivae clear without exudates or hemorrhage Neck: Supple, no carotid bruits. Pulmonary: Clear to auscultation  bilaterally   NEUROLOGICAL EXAM:  MENTAL STATUS: Speech:    Speech is normal; fluent and spontaneous with normal comprehension.  Cognition:     Orientation to time, place and person     Normal recent and remote memory     Normal Attention span and concentration     Normal Language, naming, repeating,spontaneous speech     Fund of knowledge   CRANIAL NERVES: CN II: Visual fields are full to confrontation. Pupils are round equal and briskly reactive to light. CN III, IV, VI: extraocular movement are normal. No ptosis. CN V: Facial sensation is intact to pinprick in all 3 divisions bilaterally. Corneal responses are intact.  CN VII: Face is symmetric with normal eye closure and smile. CN VIII: Hearing is normal to causal conversation. CN IX, X: Palate elevates symmetrically. Phonation is normal. CN XI: Head turning and shoulder shrug are intact CN XII: Tongue is midline with normal movements and no atrophy.  MOTOR: There is no pronator drift of out-stretched arms. Muscle bulk and tone are normal. Muscle strength is normal.  REFLEXES: Reflexes are 2+ and symmetric at the biceps, triceps, knees, and ankles. Plantar responses are flexor.  SENSORY: Intact to light touch, pinprick and vibratory sensation are intact in fingers and toes.  COORDINATION: There is no trunk or limb dysmetria noted.  GAIT/STANCE: Posture is normal.  Gait is steady with normal steps, base, arm swing, and turning. Heel and toe walking are normal. Tandem gait is normal.  Romberg is absent.   DIAGNOSTIC DATA (LABS, IMAGING, TESTING) - I reviewed patient records, labs, notes, testing and imaging myself where available.   ASSESSMENT AND PLAN  TANNIS DYMENT is a 63 y.o. female   Dizziness  There is no evidence of orthostatic changes,  Differentiation diagnoses also include medication side effect, dehydration, deconditioning  She will continue to observe her symptoms, will return to clinic in 3 months, will order MRI of the brain without contrast if she remains symptomatic.     Marcial Pacas, M.D. Ph.D.  [facility] [address] [place], [place] [address] Ph: [phone] Fax: [phone]  CC: Referring Provider

## 2019-05-22 ENCOUNTER — Encounter: Payer: Self-pay | Admitting: Family Medicine

## 2019-05-22 ENCOUNTER — Ambulatory Visit (INDEPENDENT_AMBULATORY_CARE_PROVIDER_SITE_OTHER): Payer: Medicare Other | Admitting: Family Medicine

## 2019-05-22 ENCOUNTER — Other Ambulatory Visit: Payer: Self-pay

## 2019-05-22 DIAGNOSIS — R05 Cough: Secondary | ICD-10-CM

## 2019-05-22 DIAGNOSIS — J029 Acute pharyngitis, unspecified: Secondary | ICD-10-CM

## 2019-05-22 DIAGNOSIS — R059 Cough, unspecified: Secondary | ICD-10-CM

## 2019-05-22 MED ORDER — BENZONATATE 100 MG PO CAPS: 100.0000 mg | ORAL_CAPSULE | Freq: Two times a day (BID) | ORAL | 1 refills | Status: DC | PRN

## 2019-05-22 MED ORDER — AMOXICILLIN-POT CLAVULANATE 875-125 MG PO TABS: 1.0000 | ORAL_TABLET | Freq: Two times a day (BID) | ORAL | 0 refills | Status: DC

## 2019-05-22 NOTE — Progress Notes (Signed)
Date:  05/22/2019   Name:  Amanda Osborn   DOB:  1955-11-16   MRN:  [id number]   Chief Complaint: Sore Throat (no fever- been going through old files with dust. No sinus concerns- took mucinex this am)  I connected withthis patient, Amanda Osborn, by telephoneat the patient's work.  I verified that I am speaking with the correct person using two identifiers. This visit was conducted via telephone due to the Covid-19 outbreak from my office at [facility] in [place], [place]. I discussed the limitations, risks, security and privacy concerns of performing an evaluation and management service by telephone. I also discussed with the patient that there may be a patient responsible charge related to this service. The patient expressed understanding and agreed to proceed.  Sore Throat  This is a new problem. The current episode started in the past 7 days (2-3 days). The problem has been gradually worsening. The pain is worse on the right side. There has been no fever. The pain is at a severity of 5/10. The pain is moderate. Associated symptoms include coughing. Pertinent negatives include no abdominal pain, congestion, diarrhea, drooling, ear discharge, ear pain, headaches, hoarse voice, plugged ear sensation, neck pain, shortness of breath, stridor, swollen glands, trouble swallowing or vomiting. She has had no exposure to strep or mono. She has tried acetaminophen for the symptoms. The treatment provided no relief.    Lab Results  Component Value Date   CREATININE 1.00 03/19/2019   BUN 17 05/17/2018   NA 144 05/17/2018   K 4.6 05/17/2018   CL 102 05/17/2018   CO2 26 05/17/2018   Lab Results  Component Value Date   CHOL 172 05/17/2018   HDL 82 05/17/2018   LDLCALC 77 05/17/2018   TRIG 64 05/17/2018   CHOLHDL 2.1 05/17/2018   Lab Results  Component Value Date   TSH 1.430 05/17/2018   Lab Results  Component Value Date   HGBA1C 5.3 12/29/2017     Review of Systems    Constitutional: Negative.  Negative for chills, fatigue, fever and unexpected weight change.  HENT: Negative for congestion, drooling, ear discharge, ear pain, hoarse voice, rhinorrhea, sinus pressure, sneezing, sore throat and trouble swallowing.   Eyes: Negative for photophobia, pain, discharge, redness and itching.  Respiratory: Positive for cough. Negative for shortness of breath, wheezing and stridor.   Gastrointestinal: Negative for abdominal pain, blood in stool, constipation, diarrhea, nausea and vomiting.  Endocrine: Negative for cold intolerance, heat intolerance, polydipsia, polyphagia and polyuria.  Genitourinary: Negative for dysuria, flank pain, frequency, hematuria, menstrual problem, pelvic pain, urgency, vaginal bleeding and vaginal discharge.  Musculoskeletal: Negative for arthralgias, back pain, myalgias and neck pain.  Skin: Negative for rash.  Allergic/Immunologic: Negative for environmental allergies and food allergies.  Neurological: Negative for dizziness, weakness, light-headedness, numbness and headaches.  Hematological: Negative for adenopathy. Does not bruise/bleed easily.  Psychiatric/Behavioral: Negative for dysphoric mood. The patient is not nervous/anxious.     Patient Active Problem List   Diagnosis Date Noted  . Dizziness 03/21/2019  . CVA (cerebral vascular accident) ([place]) 12/29/2017  . Slurred speech 12/28/2017  . Malignant neoplastic disease ([place]) 03/18/2015  . Chondromalacia of patella 09/13/2014  . Bursitis 09/13/2014  . Allergic rhinitis 09/13/2014  . Cancer ([place]) 09/13/2014  . Cervical disc disease 09/13/2014    Allergies  Allergen Reactions  . Montelukast Sodium     Caused headaches    Past Surgical History:  Procedure Laterality Date  .  AUGMENTATION MAMMAPLASTY Bilateral 2011  . BREAST CYST ASPIRATION Bilateral   . BREAST EXCISIONAL BIOPSY Left 2006   breast ca  . BREAST LUMPECTOMY    . CARDIAC CATHETERIZATION    . COLONOSCOPY     . COLONOSCOPY WITH PROPOFOL N/A 06/28/2016   Procedure: COLONOSCOPY WITH PROPOFOL;  Surgeon: Lollie Sails, MD;  Location: [facility] ENDOSCOPY;  Service: Endoscopy;  Laterality: N/A;  . MASTECTOMY Bilateral   . TEE WITHOUT CARDIOVERSION N/A 12/30/2017   Procedure: TRANSESOPHAGEAL ECHOCARDIOGRAM (TEE);  Surgeon: Teodoro Spray, MD;  Location: [facility] ORS;  Service: Cardiovascular;  Laterality: N/A;  . VAGINAL HYSTERECTOMY      Social History   Tobacco Use  . Smoking status: Never Smoker  . Smokeless tobacco: Never Used  Substance Use Topics  . Alcohol use: No    Alcohol/week: 0.0 standard drinks  . Drug use: No     Medication list has been reviewed and updated.  Current Meds  Medication Sig  . acyclovir (ZOVIRAX) 400 MG tablet Take 400 mg by mouth 2 (two) times daily.  Marland Kitchen aspirin EC 325 MG EC tablet Take 1 tablet (325 mg total) by mouth daily.  Marland Kitchen buPROPion (WELLBUTRIN SR) 100 MG 12 hr tablet Take 100 mg by mouth daily.  . Multiple Vitamin (MULTI-VITAMINS) TABS Take 1 tablet by mouth daily.  . rosuvastatin (CRESTOR) 20 MG tablet TAKE 1 TABLET DAILY (NEED APPOINTMENT)  . sertraline (ZOLOFT) 50 MG tablet Take 1 tablet (50 mg total) by mouth daily.  . traZODone (DESYREL) 50 MG tablet Take 1-2 tablets (50-100 mg total) by mouth at bedtime.  . [DISCONTINUED] ibandronate (BONIVA) 150 MG tablet Take 1 tablet by mouth every 30 (thirty) days.    PHQ 2/9 Scores 05/22/2019 12/28/2018 05/17/2018 01/13/2018  PHQ - 2 Score 0 0 0 0  PHQ- 9 Score 0 0 3 3    BP Readings from Last 3 Encounters:  03/21/19 119/72  12/28/18 110/77  05/17/18 102/76    Physical Exam Nursing note reviewed. Vitals reviewed: Skull exam limited due to the televisit format.  HENT:     Mouth/Throat:     Pharynx: Posterior oropharyngeal erythema present. No oropharyngeal exudate.     Comments: Per patient exam    Wt Readings from Last 3 Encounters:  03/21/19 105 lb (47.6 kg)  12/28/18 103 lb 6.4 oz (46.9 kg)  05/17/18  105 lb (47.6 kg)    There were no vitals taken for this visit.  Assessment and Plan:

## 2019-06-28 ENCOUNTER — Ambulatory Visit: Payer: Medicare Other | Admitting: Neurology

## 2019-08-13 ENCOUNTER — Encounter: Payer: Self-pay | Admitting: Family Medicine

## 2019-08-13 ENCOUNTER — Ambulatory Visit: Payer: Self-pay | Admitting: Family Medicine

## 2019-08-13 ENCOUNTER — Ambulatory Visit (INDEPENDENT_AMBULATORY_CARE_PROVIDER_SITE_OTHER): Payer: Medicare Other | Admitting: Family Medicine

## 2019-08-13 ENCOUNTER — Other Ambulatory Visit: Payer: Self-pay

## 2019-08-13 VITALS — BP 120/60 | HR 80 | Ht 63.0 in | Wt 103.0 lb

## 2019-08-13 DIAGNOSIS — R059 Cough, unspecified: Secondary | ICD-10-CM

## 2019-08-13 DIAGNOSIS — R310 Gross hematuria: Secondary | ICD-10-CM

## 2019-08-13 DIAGNOSIS — R05 Cough: Secondary | ICD-10-CM

## 2019-08-13 DIAGNOSIS — R300 Dysuria: Secondary | ICD-10-CM

## 2019-08-13 LAB — POCT URINALYSIS DIPSTICK
Bilirubin, UA: NEGATIVE
Blood, UA: NEGATIVE
Glucose, UA: NEGATIVE
Ketones, UA: NEGATIVE
Leukocytes, UA: NEGATIVE
Nitrite, UA: NEGATIVE
Protein, UA: POSITIVE — AB
Spec Grav, UA: 1.01 (ref 1.010–1.025)
Urobilinogen, UA: 0.2 E.U./dL
pH, UA: 5 (ref 5.0–8.0)

## 2019-08-13 MED ORDER — BENZONATATE 100 MG PO CAPS: 100.0000 mg | ORAL_CAPSULE | Freq: Two times a day (BID) | ORAL | 5 refills | Status: DC | PRN

## 2019-08-13 MED ORDER — CEPHALEXIN 500 MG PO CAPS: 500.0000 mg | ORAL_CAPSULE | Freq: Two times a day (BID) | ORAL | 0 refills | Status: DC

## 2019-08-13 NOTE — Telephone Encounter (Signed)
blood in urine x 1 episode on Thursday, and then again on Sat; the blood was bright red; it was noted on the tissue, and was more than a smear; the pt says she has a small amount of abdominal pain that started on 08/10/19 rated 1 out of 10; the pt states she and her husband have been making smooihies with blue berries, blackberries, and strawberries, and the blood in her urine and pain started after she started drinking those; the pain lasted < 1 hour; the pt takes ASA 325 mg daily; she normally takes tramadol prn for pain ongoing back pain; recommendations made per nurse triage protocol;decision tree completed; pt offered and accepted virtual appt with Dr Otilio Miu, [place], 08/13/19 at 1120; she verbalized understanding; will route to office for notification.   Reason for Disposition . Side (flank) or back pain present  Answer Assessment - Initial Assessment Questions 1. COLOR of URINE: "Describe the color of the urine."  (e.g., tea-colored, pink, red, blood clots, bloody)     Bright red 2. ONSET: "When did the bleeding start?"    08/09/19 3. EPISODES: "How many times has there been blood in the urine?" or "How many times today?"   08/08/19 x 1 and 08/11/19 x 1 4. PAIN with URINATION: "Is there any pain with passing your urine?" If so, ask: "How bad is the pain?"  (Scale 1-10; or mild, moderate, severe)    - MILD - complains slightly about urination hurting    - MODERATE - interferes with normal activities      - SEVERE - excruciating, unwilling or unable to urinate because of the pain     1 out of 10 5. FEVER: "Do you have a fever?" If so, ask: "What is your temperature, how was it measured, and when did it start?"     no 6. ASSOCIATED SYMPTOMS: "Are you passing urine more frequently than usual?"     Yes more frequent urination  7. OTHER SYMPTOMS: "Do you have any other symptoms?" (e.g., back/flank pain, abdominal pain, vomiting)    Abdominal pain;normally has back pain 8. PREGNANCY: "Is  there any chance you are pregnant?" "When was your last menstrual period?"     no  Protocols used: URINE - BLOOD IN-A-AH

## 2019-08-13 NOTE — Progress Notes (Signed)
Date:  08/13/2019   Name:  Amanda Osborn   DOB:  04-20-1955   MRN:  [id number]   Chief Complaint: Urinary Tract Infection (wiping bright red blood after urinating. Has not seen any x 2 days, but has some pressure/discomfort when urinating)  Urinary Tract Infection  This is a chronic problem. The current episode started more than 1 year ago. The problem occurs intermittently. The problem has been waxing and waning. Quality: suprapubic. The pain is at a severity of 4/10. The pain is mild. There has been no fever. Associated symptoms include frequency and hematuria. Pertinent negatives include no chills, discharge, flank pain, hesitancy, nausea, sweats, urgency or vomiting. The treatment provided moderate relief.    Lab Results  Component Value Date   CREATININE 1.00 03/19/2019   BUN 17 05/17/2018   NA 144 05/17/2018   K 4.6 05/17/2018   CL 102 05/17/2018   CO2 26 05/17/2018   Lab Results  Component Value Date   CHOL 172 05/17/2018   HDL 82 05/17/2018   LDLCALC 77 05/17/2018   TRIG 64 05/17/2018   CHOLHDL 2.1 05/17/2018   Lab Results  Component Value Date   TSH 1.430 05/17/2018   Lab Results  Component Value Date   HGBA1C 5.3 12/29/2017   Lab Results  Component Value Date   WBC 4.8 12/28/2017   HGB 14.2 05/17/2018   HCT 35.7 12/28/2017   MCV 95.0 12/28/2017   PLT 164 12/28/2017   Lab Results  Component Value Date   ALT 32 12/28/2017   AST 28 12/28/2017   ALKPHOS 34 (L) 12/28/2017   BILITOT 0.3 12/28/2017     Review of Systems  Constitutional: Negative.  Negative for chills, fatigue, fever and unexpected weight change.  HENT: Negative for congestion, ear discharge, ear pain, rhinorrhea, sinus pressure, sneezing and sore throat.   Eyes: Negative for photophobia, pain, discharge, redness and itching.  Respiratory: Negative for cough, shortness of breath, wheezing and stridor.   Gastrointestinal: Negative for abdominal pain, blood in stool, constipation,  diarrhea, nausea and vomiting.  Endocrine: Negative for cold intolerance, heat intolerance, polydipsia, polyphagia and polyuria.  Genitourinary: Positive for dyspareunia, dysuria, frequency, hematuria and pelvic pain. Negative for flank pain, hesitancy, menstrual problem, urgency, vaginal bleeding and vaginal discharge.       Nocturia  Musculoskeletal: Negative for arthralgias, back pain and myalgias.  Skin: Negative for rash.  Allergic/Immunologic: Negative for environmental allergies and food allergies.  Neurological: Negative for dizziness, weakness, light-headedness, numbness and headaches.  Hematological: Negative for adenopathy. Does not bruise/bleed easily.  Psychiatric/Behavioral: Negative for dysphoric mood. The patient is not nervous/anxious.     Patient Active Problem List   Diagnosis Date Noted  . Dizziness 03/21/2019  . CVA (cerebral vascular accident) ([place]) 12/29/2017  . Slurred speech 12/28/2017  . Malignant neoplastic disease ([place]) 03/18/2015  . Chondromalacia of patella 09/13/2014  . Bursitis 09/13/2014  . Allergic rhinitis 09/13/2014  . Cancer ([place]) 09/13/2014  . Cervical disc disease 09/13/2014    Allergies  Allergen Reactions  . Montelukast Sodium     Caused headaches    Past Surgical History:  Procedure Laterality Date  . AUGMENTATION MAMMAPLASTY Bilateral 2011  . BREAST CYST ASPIRATION Bilateral   . BREAST EXCISIONAL BIOPSY Left 2006   breast ca  . BREAST LUMPECTOMY    . CARDIAC CATHETERIZATION    . COLONOSCOPY    . COLONOSCOPY WITH PROPOFOL N/A 06/28/2016   Procedure: COLONOSCOPY WITH PROPOFOL;  Surgeon: Billie Ruddy  Gustavo Lah, MD;  Location: [facility] ENDOSCOPY;  Service: Endoscopy;  Laterality: N/A;  . MASTECTOMY Bilateral   . TEE WITHOUT CARDIOVERSION N/A 12/30/2017   Procedure: TRANSESOPHAGEAL ECHOCARDIOGRAM (TEE);  Surgeon: Teodoro Spray, MD;  Location: [facility] ORS;  Service: Cardiovascular;  Laterality: N/A;  . VAGINAL HYSTERECTOMY      Social History     Tobacco Use  . Smoking status: Never Smoker  . Smokeless tobacco: Never Used  Substance Use Topics  . Alcohol use: No    Alcohol/week: 0.0 standard drinks  . Drug use: No     Medication list has been reviewed and updated.  Current Meds  Medication Sig  . acyclovir (ZOVIRAX) 400 MG tablet Take 400 mg by mouth 2 (two) times daily.  Marland Kitchen aspirin EC 325 MG EC tablet Take 1 tablet (325 mg total) by mouth daily.  Marland Kitchen buPROPion (WELLBUTRIN SR) 100 MG 12 hr tablet Take 100 mg by mouth daily.  . Multiple Vitamin (MULTI-VITAMINS) TABS Take 1 tablet by mouth daily.  . rosuvastatin (CRESTOR) 20 MG tablet TAKE 1 TABLET DAILY (NEED APPOINTMENT)  . sertraline (ZOLOFT) 50 MG tablet Take 1 tablet (50 mg total) by mouth daily.  . traZODone (DESYREL) 50 MG tablet Take 1-2 tablets (50-100 mg total) by mouth at bedtime.    PHQ 2/9 Scores 08/13/2019 05/22/2019 12/28/2018 05/17/2018  PHQ - 2 Score 0 0 0 0  PHQ- 9 Score 3 0 0 3    BP Readings from Last 3 Encounters:  08/13/19 120/60  03/21/19 119/72  12/28/18 110/77    Physical Exam Vitals and nursing note reviewed.  Constitutional:      General: She is not in acute distress.    Appearance: She is not diaphoretic.  HENT:     Head: Normocephalic and atraumatic.     Right Ear: Tympanic membrane, ear canal and external ear normal.     Left Ear: Tympanic membrane, ear canal and external ear normal.     Nose: Nose normal.  Eyes:     General:        Right eye: No discharge.        Left eye: No discharge.     Conjunctiva/sclera: Conjunctivae normal.     Pupils: Pupils are equal, round, and reactive to light.  Neck:     Thyroid: No thyromegaly.     Vascular: No JVD.  Cardiovascular:     Rate and Rhythm: Normal rate and regular rhythm.     Heart sounds: Normal heart sounds. No murmur. No friction rub. No gallop.   Pulmonary:     Effort: Pulmonary effort is normal.     Breath sounds: Normal breath sounds. No wheezing or rhonchi.  Abdominal:      General: Bowel sounds are normal.     Palpations: Abdomen is soft. There is no mass.     Tenderness: There is no abdominal tenderness. There is no guarding.  Musculoskeletal:        General: Normal range of motion.     Cervical back: Normal range of motion and neck supple.  Lymphadenopathy:     Cervical: No cervical adenopathy.  Skin:    General: Skin is warm and dry.  Neurological:     Mental Status: She is alert.     Deep Tendon Reflexes: Reflexes are normal and symmetric.     Wt Readings from Last 3 Encounters:  08/13/19 103 lb (46.7 kg)  03/21/19 105 lb (47.6 kg)  12/28/18 103 lb 6.4 oz (46.9  kg)    BP 120/60   Pulse 80   Ht 5\' 3"  (1.6 m)   Wt 103 lb (46.7 kg)   BMI 18.25 kg/m   Assessment and Plan: 1. Dysuria New onset.  Patient continues to have discomfort primarily in the suprapubic area which is on an episodic basis.  Patient also feels as if she may not be emptying her bladder completely and there may be some residual that the patient is having as well we will refer to urology in the meantime because she still having some symptoms of dysuria as well as suprapubic discomfort and tenderness we will treat with 3 days only of cephalexin in case there is a urethritis prior to being evaluated on referral to urology. - Ambulatory referral to Urology - cephALEXin (KEFLEX) 500 MG capsule; Take 1 capsule (500 mg total) by mouth 2 (two) times daily.  Dispense: 6 capsule; Refill: 0  2. Gross hematuria Patient had an episode that upon wiping that she noticed gross blood periurethral not associated with the vagina area or the rectal area.  Reviewed GYN where she had evaluation of the pelvic area with showed only constipation and the question of a ovarian cyst which was unremarkable.  Patient has had a hysterectomy which would make postmenopausal bleeding unlikely.  Review of the patient's last colonoscopy in 2018 notes that there is only one polyp which was unremarkable on pathology  and she is on a every 10-year basis for evaluation of her colon. - Ambulatory referral to Urology - POCT urinalysis dipstick  3. Cough Patient has a persistent cough which is likely due to allergies which is controlled with Tessalon Perles twice a day on a as needed basis. - benzonatate (TESSALON) 100 MG capsule; Take 1 capsule (100 mg total) by mouth 2 (two) times daily as needed for cough.  Dispense: 40 capsule; Refill: 5

## 2019-08-13 NOTE — Telephone Encounter (Signed)
Will see today.  

## 2019-08-15 ENCOUNTER — Encounter: Payer: Self-pay | Admitting: Urology

## 2019-08-15 ENCOUNTER — Other Ambulatory Visit: Payer: Self-pay

## 2019-08-15 ENCOUNTER — Ambulatory Visit (INDEPENDENT_AMBULATORY_CARE_PROVIDER_SITE_OTHER): Payer: Medicare Other | Admitting: Urology

## 2019-08-15 VITALS — BP 111/74 | HR 64 | Ht 63.0 in | Wt 103.0 lb

## 2019-08-15 DIAGNOSIS — R300 Dysuria: Secondary | ICD-10-CM

## 2019-08-15 DIAGNOSIS — R310 Gross hematuria: Secondary | ICD-10-CM | POA: Diagnosis not present

## 2019-08-15 NOTE — Patient Instructions (Signed)

## 2019-08-15 NOTE — Progress Notes (Signed)
   08/15/19 2:25 PM   Amanda Osborn September 10, 1955 [id number]  CC: Gross hematuria  HPI: I saw Amanda Osborn in urology clinic today for evaluation of gross hematuria.  She is a 64 year old female with a history of breast cancer and stroke who reports 2 to 3 days of bright red gross hematuria about a week ago with no other urinary symptoms of dysuria, urgency, or frequency.  A urinalysis with her PCP was completely benign at that time.  She denies any flank pain or weight loss.  There is no prior abdominal cross-sectional imaging to review.  She is a never smoker and denies any other carcinogenic exposures.  She denies any urinary symptoms of dysuria or incontinence.  Denies any weight loss or weight gain.  Urinalysis today is also benign.   PMH: Past Medical History:  Diagnosis Date  . Allergy   . Breast cancer ([place])   . Headache   . History of CVA (cerebrovascular accident)   . Osteoporosis     Surgical History: Past Surgical History:  Procedure Laterality Date  . AUGMENTATION MAMMAPLASTY Bilateral 2011  . BREAST CYST ASPIRATION Bilateral   . BREAST EXCISIONAL BIOPSY Left 2006   breast ca  . BREAST LUMPECTOMY    . CARDIAC CATHETERIZATION    . COLONOSCOPY    . COLONOSCOPY WITH PROPOFOL N/A 06/28/2016   Procedure: COLONOSCOPY WITH PROPOFOL;  Surgeon: Lollie Sails, MD;  Location: [facility] ENDOSCOPY;  Service: Endoscopy;  Laterality: N/A;  . MASTECTOMY Bilateral   . TEE WITHOUT CARDIOVERSION N/A 12/30/2017   Procedure: TRANSESOPHAGEAL ECHOCARDIOGRAM (TEE);  Surgeon: Teodoro Spray, MD;  Location: [facility] ORS;  Service: Cardiovascular;  Laterality: N/A;  . VAGINAL HYSTERECTOMY      Family History: Family History  Problem Relation Age of Onset  . Breast cancer Paternal Aunt   . Breast cancer Paternal Grandmother   . Dementia Mother        passed at age 78  . Leukemia Father        passed at age 74  . Leukemia Brother     Social History:  reports that she has never  smoked. She has never used smokeless tobacco. She reports that she does not drink alcohol or use drugs.  Physical Exam: BP 111/74   Pulse 64   Ht 5\' 3"  (1.6 m)   Wt 103 lb (46.7 kg)   BMI 18.25 kg/m    Constitutional:  Alert and oriented, No acute distress. Cardiovascular: No clubbing, cyanosis, or edema. Respiratory: Normal respiratory effort, no increased work of breathing. GI: Abdomen is soft, nontender, nondistended, no abdominal masses GU: No CVA tenderness  Laboratory Data: Reviewed  Pertinent Imaging: None to review  Assessment & Plan:   In summary, she is a relatively healthy 64 year old female with 2 to 3 days of gross hematuria that has since resolved spontaneously with no other urinary symptoms.  We discussed common possible etiologies of hematuria including malignancy, urolithiasis, medical renal disease, and idiopathic. Standard workup recommended by the AUA includes imaging with CT urogram to assess the upper tracts, and cystoscopy. Cytology is performed on patient's with gross hematuria to look for malignant cells in the urine.  CT urogram and cystoscopy to complete hematuria work-up  Nickolas Madrid, MD 08/15/2019  [facility] [address] [place], [place] [address] [phone]

## 2019-08-16 ENCOUNTER — Ambulatory Visit: Payer: Medicare Other | Admitting: Neurology

## 2019-08-16 LAB — MICROSCOPIC EXAMINATION: RBC, Urine: NONE SEEN /hpf (ref 0–2)

## 2019-08-16 LAB — URINALYSIS, COMPLETE
Bilirubin, UA: NEGATIVE
Glucose, UA: NEGATIVE
Ketones, UA: NEGATIVE
Leukocytes,UA: NEGATIVE
Nitrite, UA: NEGATIVE
RBC, UA: NEGATIVE
Specific Gravity, UA: 1.025 (ref 1.005–1.030)
Urobilinogen, Ur: 0.2 mg/dL (ref 0.2–1.0)
pH, UA: 5 (ref 5.0–7.5)

## 2019-08-17 ENCOUNTER — Other Ambulatory Visit: Payer: Medicare Other

## 2019-08-29 ENCOUNTER — Ambulatory Visit: Admission: RE | Admit: 2019-08-29 | Payer: Medicare Other | Source: Ambulatory Visit

## 2019-08-29 ENCOUNTER — Ambulatory Visit
Admission: RE | Admit: 2019-08-29 | Discharge: 2019-08-29 | Disposition: A | Discharge status: Home / Self Care | Payer: Medicare Other | Source: Ambulatory Visit | Attending: Urology | Admitting: Urology

## 2019-08-29 ENCOUNTER — Other Ambulatory Visit: Payer: Self-pay

## 2019-08-29 DIAGNOSIS — R310 Gross hematuria: Secondary | ICD-10-CM | POA: Insufficient documentation

## 2019-08-29 LAB — POCT I-STAT CREATININE: Creatinine, Ser: 0.8 mg/dL (ref 0.44–1.00)

## 2019-08-29 MED ORDER — IOHEXOL 300 MG/ML  SOLN
100.0000 mL | Freq: Once | INTRAMUSCULAR | Status: AC | PRN
  Administered 2019-08-29: 100 mL via INTRAVENOUS

## 2019-08-30 ENCOUNTER — Encounter: Payer: Self-pay | Admitting: Urology

## 2019-08-30 ENCOUNTER — Ambulatory Visit (INDEPENDENT_AMBULATORY_CARE_PROVIDER_SITE_OTHER): Payer: Medicare Other | Admitting: Urology

## 2019-08-30 VITALS — BP 130/79 | HR 71 | Ht 63.0 in | Wt 103.0 lb

## 2019-08-30 DIAGNOSIS — R102 Pelvic and perineal pain: Secondary | ICD-10-CM | POA: Insufficient documentation

## 2019-08-30 DIAGNOSIS — R310 Gross hematuria: Secondary | ICD-10-CM | POA: Diagnosis not present

## 2019-08-30 MED ORDER — LIDOCAINE HCL URETHRAL/MUCOSAL 2 % EX GEL
1.0000 "application " | Freq: Once | CUTANEOUS | Status: AC
  Administered 2019-08-30: 1 via URETHRAL

## 2019-08-30 NOTE — Progress Notes (Signed)
Cystoscopy Procedure Note:  Indication: Gross hematuria  After informed consent and discussion of the procedure and its risks, Amanda Osborn was positioned and prepped in the standard fashion. Cystoscopy was performed with a flexible cystoscope. The urethra, bladder neck and entire bladder was visualized in a standard fashion. The ureteral orifices were visualized in their normal location and orientation.  Bladder mucosa was grossly normal throughout.  Cytology sent.  Imaging: I personally reviewed the CT urogram dated 08/29/2019.  There is mild left hydronephrosis with a possible UPJ obstruction either chronic or from malignancy.  No evidence of lymphadenopathy or other abnormalities.  No nephrolithiasis.  Findings: Normal cystoscopy  ---------------------------------------------------------------------------------------  Assessment and Plan: Healthy 64 year old female with no risk factors for urothelial cell carcinoma with multiple episodes of gross hematuria that have since resolved.  She has no flank pain and denies any urinary complaints.  Renal function is normal with a creatinine of 0.8.  We reviewed her CT urogram results today that show a possible left UPJ obstruction and we discussed possible etiologies of these including chronic, idiopathic, or even malignancy.  There is no prior cross-sectional imaging to review chronicity.  I recommended left retrograde pyelogram and diagnostic ureteroscopy with possible biopsy and ureteral stent placement in the operating room, and the risks and benefits were discussed at length.  Schedule left retrograde pyelogram, diagnostic ureteroscopy, possible biopsy, possible stent placement  Nickolas Madrid, MD 08/30/2019

## 2019-08-31 LAB — MICROSCOPIC EXAMINATION
Bacteria, UA: NONE SEEN
RBC, Urine: NONE SEEN /hpf (ref 0–2)

## 2019-08-31 LAB — URINALYSIS, COMPLETE
Bilirubin, UA: NEGATIVE
Glucose, UA: NEGATIVE
Ketones, UA: NEGATIVE
Leukocytes,UA: NEGATIVE
Nitrite, UA: NEGATIVE
RBC, UA: NEGATIVE
Specific Gravity, UA: >1.03 — ABNORMAL HIGH (ref 1.005–1.030)
Urobilinogen, Ur: 0.2 mg/dL (ref 0.2–1.0)
pH, UA: 5 (ref 5.0–7.5)

## 2019-09-05 ENCOUNTER — Other Ambulatory Visit: Payer: Self-pay

## 2019-09-05 ENCOUNTER — Other Ambulatory Visit: Payer: Self-pay | Admitting: Urology

## 2019-09-05 DIAGNOSIS — N201 Calculus of ureter: Secondary | ICD-10-CM

## 2019-09-05 DIAGNOSIS — Z01818 Encounter for other preprocedural examination: Secondary | ICD-10-CM

## 2019-09-06 ENCOUNTER — Telehealth: Payer: Self-pay | Admitting: Family Medicine

## 2019-09-06 ENCOUNTER — Other Ambulatory Visit: Payer: Self-pay | Admitting: Urology

## 2019-09-06 NOTE — Telephone Encounter (Signed)
Copied from [place] [id number]. Topic: General - Inquiry >> Sep 06, 2019  3:51 PM Alease Frame wrote: Reason for CRM: Pt is wanting a call back from tara Dr Ronnald Ramp nurse . Pt wouldn't state why . Please advise

## 2019-09-07 NOTE — Telephone Encounter (Signed)
Tried calling pt back left message for her to call back. There was no details on why she was calling. KP

## 2019-09-07 NOTE — Telephone Encounter (Signed)
Per Baxter Flattery please call pt back.

## 2019-09-11 ENCOUNTER — Other Ambulatory Visit: Payer: Self-pay

## 2019-09-11 ENCOUNTER — Other Ambulatory Visit: Payer: Medicare Other

## 2019-09-11 DIAGNOSIS — Z01818 Encounter for other preprocedural examination: Secondary | ICD-10-CM

## 2019-09-13 ENCOUNTER — Other Ambulatory Visit: Payer: Self-pay

## 2019-09-13 ENCOUNTER — Other Ambulatory Visit: Payer: Self-pay | Admitting: Urology

## 2019-09-13 ENCOUNTER — Encounter
Admission: RE | Admit: 2019-09-13 | Discharge: 2019-09-13 | Disposition: A | Discharge status: Home / Self Care | Payer: Medicare Other | Source: Ambulatory Visit | Attending: Urology | Admitting: Urology

## 2019-09-13 ENCOUNTER — Telehealth: Payer: Self-pay | Admitting: Urology

## 2019-09-13 DIAGNOSIS — Z8673 Personal history of transient ischemic attack (TIA), and cerebral infarction without residual deficits: Secondary | ICD-10-CM | POA: Diagnosis not present

## 2019-09-13 DIAGNOSIS — Z01818 Encounter for other preprocedural examination: Secondary | ICD-10-CM | POA: Diagnosis not present

## 2019-09-13 HISTORY — DX: Unspecified osteoarthritis, unspecified site: M19.90

## 2019-09-13 HISTORY — DX: Dizziness and giddiness: R42

## 2019-09-13 HISTORY — DX: Anxiety disorder, unspecified: F41.9

## 2019-09-13 LAB — BASIC METABOLIC PANEL
Anion gap: 8 (ref 5–15)
BUN: 21 mg/dL (ref 8–23)
CO2: 28 mmol/L (ref 22–32)
Calcium: 9.5 mg/dL (ref 8.9–10.3)
Chloride: 103 mmol/L (ref 98–111)
Creatinine, Ser: 0.74 mg/dL (ref 0.44–1.00)
GFR calc Af Amer: >60 mL/min (ref >60)
GFR calc non Af Amer: >60 mL/min (ref >60)
Glucose, Bld: 87 mg/dL (ref 70–99)
Potassium: 4.4 mmol/L (ref 3.5–5.1)
Sodium: 139 mmol/L (ref 135–145)

## 2019-09-13 LAB — CBC
HCT: 36.4 % (ref 36.0–46.0)
Hemoglobin: 12.4 g/dL (ref 12.0–15.0)
MCH: 32.5 pg (ref 26.0–34.0)
MCHC: 34.1 g/dL (ref 30.0–36.0)
MCV: 95.3 fL (ref 80.0–100.0)
Platelets: 175 10*3/uL (ref 150–400)
RBC: 3.82 MIL/uL — ABNORMAL LOW (ref 3.87–5.11)
RDW: 12.9 % (ref 11.5–15.5)
WBC: 4.8 10*3/uL (ref 4.0–10.5)
nRBC: 0 % (ref 0.0–0.2)

## 2019-09-13 LAB — CULTURE, URINE COMPREHENSIVE

## 2019-09-13 NOTE — Patient Instructions (Addendum)
Your procedure is scheduled on: 09-21-19 FRIDAY Report to Same Day Surgery 2nd floor medical mall [facility] Entrance-take elevator on left to 2nd floor.  Check in with surgery information desk.) To find out your arrival time please call [phone] between 1PM - 3PM on 09-20-19 THURSDAY  Remember: Instructions that are not followed completely may result in serious medical risk, up to and including death, or upon the discretion of your surgeon and anesthesiologist your surgery may need to be rescheduled.    _x___ 1. Do not eat food after midnight the night before your procedure. NO GUM OR CANDY AFTER MIDNIGHT. You may drink clear liquids up to 2 hours before you are scheduled to arrive at the hospital for your procedure.  Do not drink clear liquids within 2 hours of your scheduled arrival to the hospital.  Clear liquids include  --Water or Apple juice without pulp  --Gatorade  --Black Coffee or Clear Tea (No milk, no creamers, do not add anything to the coffee or Tea-ok to add sugar)   ____Ensure clear carbohydrate drink on the way to the hospital for bariatric patients  ____Ensure clear carbohydrate drink 3 hours before surgery.     __x__ 2. No Alcohol for 24 hours before or after surgery.   __x__3. No Smoking or e-cigarettes for 24 prior to surgery.  Do not use any chewable tobacco products for at least 6 hour prior to surgery   ____  4. Bring all medications with you on the day of surgery if instructed.    __x__ 5. Notify your doctor if there is any change in your medical condition     (cold, fever, infections).    x___6. On the morning of surgery brush your teeth with toothpaste and water.  You may rinse your mouth with mouth wash if you wish.  Do not swallow any toothpaste or mouthwash.   Do not wear jewelry, make-up, hairpins, clips or nail polish.  Do not wear lotions, powders, or perfumes. You may wear deodorant.  Do not shave 48 hours prior to surgery. Men may shave face  and neck.  Do not bring valuables to the hospital.    [facility] is not responsible for any belongings or valuables.               Contacts, dentures or bridgework may not be worn into surgery.  Leave your suitcase in the car. After surgery it may be brought to your room.  For patients admitted to the hospital, discharge time is determined by your treatment team.  _  Patients discharged the day of surgery will not be allowed to drive home.  You will need someone to drive you home and stay with you the night of your procedure.    Please read over the following fact sheets that you were given:   [facility] Preparing for Surgery and or MRSA Information   _x___ TAKE THE FOLLOWING MEDICATION THE MORNING OF SURGERY WITH A SMALL SIP OF WATER. These include:  1. WELLBUTRIN (BUPROPION)  2. ZOLOFT (SERTRALINE)  3.  4.  5.  6.  ____Fleets enema or Magnesium Citrate as directed.   ____ Use CHG Soap or sage wipes as directed on instruction sheet   ____ Use inhalers on the day of surgery and bring to hospital day of surgery  ____ Stop Metformin and Janumet 2 days prior to surgery.    ____ Take 1/2 of usual insulin dose the night before surgery and none on the  morning surgery.   _x___ Follow recommendations from Cardiologist, Pulmonologist or PCP regarding stopping Aspirin, Coumadin, Plavix ,Eliquis, Effient, or Pradaxa, and Pletal-CALL DR SNINSKY'S OFFICE ABOUT WHEN TO STOP YOUR 325 MG ASPIRIN  X____Stop Anti-inflammatories such as Advil, Aleve, Ibuprofen, Motrin, Naproxen, Naprosyn, Goodies powders or aspirin products NOW-OK to take Tylenol    _x___ Stop supplements until after surgery-STOP YOUR PREVAGEN NOW. YOU MAY RESUME AFTER YOUR SURGERY   ____ Bring C-Pap to the hospital.   `

## 2019-09-14 NOTE — Pre-Procedure Instructions (Signed)
CALLED DR QSOXUJN REGARDING ABNORMAL EKG-HE IS WANTING EKG FAXED TO CARDIOLOGIST-PT HAS ALREADY BEEN CLEARED BY DR CALLWOOD AT MODERATE RISK-SENT EKG OVER TO DR CALLWOOD TO REVIEW TO MAKE SURE PT IS STILL OK TO PROCEED WITH SURGERY-FAX CONFIRMATION RECEIVED

## 2019-09-17 ENCOUNTER — Ambulatory Visit: Payer: Medicare Other | Admitting: Neurology

## 2019-09-17 NOTE — Pre-Procedure Instructions (Signed)
REGARDING CARDIOLOGY CLEARANCE  Pre-Admit Testing Provider Communication Note  Provider: Dr. Clayborn Bigness  Notification Mode: Secure Chat  Reason: Cardiology Clearance  Response: Patient is still cleared to proceed with surgery.    Additional Information: Placed on chart.  Signed: Beulah Gandy, RN

## 2019-09-19 ENCOUNTER — Other Ambulatory Visit: Payer: Self-pay

## 2019-09-19 ENCOUNTER — Encounter: Payer: Self-pay | Admitting: Family Medicine

## 2019-09-19 ENCOUNTER — Ambulatory Visit (INDEPENDENT_AMBULATORY_CARE_PROVIDER_SITE_OTHER): Payer: Medicare Other | Admitting: Family Medicine

## 2019-09-19 ENCOUNTER — Other Ambulatory Visit
Admission: RE | Admit: 2019-09-19 | Discharge: 2019-09-19 | Disposition: A | Discharge status: Home / Self Care | Payer: Medicare Other | Source: Ambulatory Visit | Attending: Urology | Admitting: Urology

## 2019-09-19 VITALS — BP 100/62 | HR 80 | Ht 63.0 in | Wt 102.0 lb

## 2019-09-19 DIAGNOSIS — Z01812 Encounter for preprocedural laboratory examination: Secondary | ICD-10-CM | POA: Insufficient documentation

## 2019-09-19 DIAGNOSIS — Z20822 Contact with and (suspected) exposure to covid-19: Secondary | ICD-10-CM | POA: Insufficient documentation

## 2019-09-19 DIAGNOSIS — F324 Major depressive disorder, single episode, in partial remission: Secondary | ICD-10-CM

## 2019-09-19 DIAGNOSIS — F5101 Primary insomnia: Secondary | ICD-10-CM

## 2019-09-19 DIAGNOSIS — E782 Mixed hyperlipidemia: Secondary | ICD-10-CM | POA: Diagnosis not present

## 2019-09-19 LAB — SARS CORONAVIRUS 2 (TAT 6-24 HRS): SARS Coronavirus 2: NEGATIVE

## 2019-09-19 MED ORDER — TRAZODONE HCL 50 MG PO TABS: 75.0000 mg | ORAL_TABLET | Freq: Every day | ORAL | 1 refills | Status: DC

## 2019-09-19 MED ORDER — SERTRALINE HCL 25 MG PO TABS: 25.0000 mg | ORAL_TABLET | ORAL | 1 refills | Status: DC

## 2019-09-19 MED ORDER — ROSUVASTATIN CALCIUM 20 MG PO TABS: 20.0000 mg | ORAL_TABLET | Freq: Every day | ORAL | 1 refills | Status: DC

## 2019-09-19 NOTE — Patient Instructions (Signed)
Golfer's Elbow  Golfer's elbow, also called medial epicondylitis, is a condition that results from inflammation of the strong bands of tissue (tendons) that attach your forearm muscles to the inside of your bone at the elbow. These tendons affect the muscles that bend the palm toward the wrist (flexion). The tendons become less flexible with age. This condition is called golfer's elbow because it is more common among people who constantly bend and twist their wrists, such as golfers. This injury is usually caused by overuse. What are the causes? This condition is caused by:  Repeatedly flexing, turning, or twisting your wrist.  Constantly gripping objects with your hands. What increases the risk? This condition is more likely to develop in people who play golf, baseball, or tennis. This injury is more common among people who have jobs that require the constant use of their hands, such as:  Carpenters.  Butchers.  Musicians.  Typists. What are the signs or symptoms? This condition causes elbow pain that may spread to your forearm and upper arm. Symptoms of this condition include.  Pain at the inner elbow, forearm, or wrist.  Reduced grip strength. The pain may get worse when you bend your wrist downward. How is this diagnosed? This condition is diagnosed based on your symptoms, medical history, and a physical exam. During the exam, your health care provider may:  Test your grip strength.  Move your wrist to check for pain. You may also have an MRI to:  Confirm the diagnosis.  Look for other issues.  Check for tears in the ligaments, muscles, or tendons. How is this treated? Treatment for this condition includes:  Stopping all activities that make you bend or twist your elbow or wrist and waiting until your pain and other symptoms go away before resuming those activities.  Wearing an elbow brace or wrist splint to restrict the movements that cause symptoms.  Icing your  inner elbow, forearm, or wrist to relieve pain.  Taking NSAIDs or getting corticosteroid injections to reduce pain and swelling.  Doing stretching, range-of-motion, and strengthening exercises (physical therapy) as told by your health care provider. In rare cases, surgery may be needed if your condition does not improve. Follow these instructions at home: If you have a brace or splint:  Wear it as told by your health care provider.  Loosen it if your fingers tingle, become numb, or turn cold and blue.  Keep it clean. Managing pain, stiffness, and swelling   If directed, put ice on the injured area. ? Put ice in a plastic bag. ? Place a towel between your skin and the bag. ? Leave the ice on for 20 minutes, 2-3 times a day.  Move your fingers often to avoid stiffness.  Raise (elevate) the injured area above the level of your heart while you are sitting or lying down. Activity  Rest your injured area as told by your health care provider.  Return to your normal activities as told by your health care provider. Ask your health care provider what activities are safe for you.  Do exercises as told by your health care provider. Lifestyle  If your condition is caused by sports, work with a trainer to make sure that you: ? Have the correct technique. ? Are using the proper equipment.  If your condition is work related, talk with your employer about changes that can be made. General instructions  Take over-the-counter and prescription medicines only as told by your health care provider.  Do not   use any products that contain nicotine or tobacco, such as cigarettes, e-cigarettes, and chewing tobacco. If you need help quitting, ask your health care provider.  Keep all follow-up visits as told by your health care provider. This is important. How is this prevented?  Before and after activity: ? Warm up and stretch before being active. ? Cool down and stretch after being  active. ? Give your body time to rest between periods of activity.  During activity: ? Make sure to use equipment that fits you. ? If you play golf, slow your golf swing to reduce shock in the arm when making contact with the ball.  Maintain physical fitness, including: ? Strength. ? Flexibility. ? Cardiovascular fitness. ? Endurance.  Do exercises to strengthen the forearm muscles. Contact a health care provider if:  Your pain does not improve or it gets worse.  You notice numbness in your hand. Get help right away if:  Your pain is severe.  You cannot move your wrist. Summary  Golfer's elbow, also called medial epicondylitis, is a condition that results from inflammation of the strong bands of tissue (tendons) that attach your forearm muscles to the inside of your bone at the elbow.  This injury usually results from overuse.  Symptoms of this condition include decreased grip strength and pain at the inner elbow, forearm, or wrist.  This injury is treated with rest, ice, medicines, physical therapy, and surgery as needed. This information is not intended to replace advice given to you by your health care provider. Make sure you discuss any questions you have with your health care provider. Document Revised: 07/20/2018 Document Reviewed: 02/02/2018 Elsevier Patient Education  2020 [employer]  

## 2019-09-19 NOTE — Progress Notes (Signed)
Date:  09/19/2019   Name:  Amanda Osborn   DOB:  08-24-1955   MRN:  [id number]   Chief Complaint: Hyperlipidemia, Depression, and Insomnia  Hyperlipidemia This is a chronic problem. The current episode started more than 1 year ago. The problem is controlled. Recent lipid tests were reviewed and are normal. She has no history of chronic renal disease, diabetes, hypothyroidism, liver disease, obesity or nephrotic syndrome. There are no known factors aggravating her hyperlipidemia. Pertinent negatives include no chest pain, focal sensory loss, focal weakness, leg pain, myalgias or shortness of breath. Current antihyperlipidemic treatment includes statins. The current treatment provides mild improvement of lipids. There are no compliance problems.  Risk factors for coronary artery disease include dyslipidemia and post-menopausal.  Depression        This is a chronic problem.  The current episode started more than 1 year ago.   The problem occurs intermittently.  Associated symptoms include helplessness and insomnia.  Associated symptoms include no decreased concentration, no fatigue, no hopelessness, not irritable, no restlessness, no decreased interest, no appetite change, no myalgias, no headaches, no indigestion, not sad and no suicidal ideas.  Past treatments include SSRIs - Selective serotonin reuptake inhibitors.  Compliance with treatment is variable.  Previous treatment provided mild relief.   Pertinent negatives include no hypothyroidism. Insomnia Primary symptoms: no fragmented sleep.  The problem occurs intermittently. The problem has been gradually improving since onset. The treatment provided moderate relief. PMH includes: associated symptoms present, no hypertension, depression, no family stress or anxiety, no restless leg syndrome, no work related stressors, no chronic pain, no apnea.     Lab Results  Component Value Date   CREATININE 0.74 09/13/2019   BUN 21 09/13/2019   NA 139  09/13/2019   K 4.4 09/13/2019   CL 103 09/13/2019   CO2 28 09/13/2019   Lab Results  Component Value Date   CHOL 172 05/17/2018   HDL 82 05/17/2018   LDLCALC 77 05/17/2018   TRIG 64 05/17/2018   CHOLHDL 2.1 05/17/2018   Lab Results  Component Value Date   TSH 1.430 05/17/2018   Lab Results  Component Value Date   HGBA1C 5.3 12/29/2017   Lab Results  Component Value Date   WBC 4.8 09/13/2019   HGB 12.4 09/13/2019   HCT 36.4 09/13/2019   MCV 95.3 09/13/2019   PLT 175 09/13/2019   Lab Results  Component Value Date   ALT 32 12/28/2017   AST 28 12/28/2017   ALKPHOS 34 (L) 12/28/2017   BILITOT 0.3 12/28/2017     Review of Systems  Constitutional: Negative.  Negative for appetite change, chills, fatigue, fever and unexpected weight change.  HENT: Negative for congestion, ear discharge, ear pain, rhinorrhea, sinus pressure, sneezing and sore throat.   Eyes: Negative for photophobia, pain, discharge, redness and itching.  Respiratory: Negative for apnea, cough, shortness of breath, wheezing and stridor.   Cardiovascular: Negative for chest pain.  Gastrointestinal: Negative for abdominal pain, blood in stool, constipation, diarrhea, nausea and vomiting.  Endocrine: Negative for cold intolerance, heat intolerance, polydipsia, polyphagia and polyuria.  Genitourinary: Negative for dysuria, flank pain, frequency, hematuria, menstrual problem, pelvic pain, urgency, vaginal bleeding and vaginal discharge.  Musculoskeletal: Negative for arthralgias, back pain and myalgias.  Skin: Negative for rash.  Allergic/Immunologic: Negative for environmental allergies and food allergies.  Neurological: Negative for dizziness, focal weakness, weakness, light-headedness, numbness and headaches.  Hematological: Negative for adenopathy. Does not bruise/bleed easily.  Psychiatric/Behavioral: Positive  for depression. Negative for decreased concentration, dysphoric mood and suicidal ideas. The  patient has insomnia. The patient is not nervous/anxious.     Patient Active Problem List   Diagnosis Date Noted  . Pelvic pain in female 08/30/2019  . Dizziness 03/21/2019  . CVA (cerebral vascular accident) ([place]) 12/29/2017  . Slurred speech 12/28/2017  . Malignant neoplastic disease ([place]) 03/18/2015  . Chondromalacia of patella 09/13/2014  . Bursitis 09/13/2014  . Allergic rhinitis 09/13/2014  . Cancer ([place]) 09/13/2014  . Cervical disc disease 09/13/2014    Allergies  Allergen Reactions  . Montelukast Sodium     Caused headaches    Past Surgical History:  Procedure Laterality Date  . AUGMENTATION MAMMAPLASTY Bilateral 2011  . BREAST CYST ASPIRATION Bilateral   . BREAST EXCISIONAL BIOPSY Left 2006   breast ca  . BREAST LUMPECTOMY    . CARDIAC CATHETERIZATION    . COLONOSCOPY    . COLONOSCOPY WITH PROPOFOL N/A 06/28/2016   Procedure: COLONOSCOPY WITH PROPOFOL;  Surgeon: Lollie Sails, MD;  Location: [facility] ENDOSCOPY;  Service: Endoscopy;  Laterality: N/A;  . LYMPH NODE BIOPSY     NECK  . MASTECTOMY Bilateral   . TEE WITHOUT CARDIOVERSION N/A 12/30/2017   Procedure: TRANSESOPHAGEAL ECHOCARDIOGRAM (TEE);  Surgeon: Teodoro Spray, MD;  Location: [facility] ORS;  Service: Cardiovascular;  Laterality: N/A;  . VAGINAL HYSTERECTOMY      Social History   Tobacco Use  . Smoking status: Never Smoker  . Smokeless tobacco: Never Used  Substance Use Topics  . Alcohol use: No    Alcohol/week: 0.0 standard drinks  . Drug use: No     Medication list has been reviewed and updated.  Current Meds  Medication Sig  . acyclovir (ZOVIRAX) 400 MG tablet Take 400 mg by mouth 2 (two) times daily as needed (fever blister).   . Apoaequorin (PREVAGEN EXTRA STRENGTH PO) Take 1 tablet by mouth daily.  Marland Kitchen aspirin EC 325 MG EC tablet Take 1 tablet (325 mg total) by mouth daily.  Marland Kitchen buPROPion (WELLBUTRIN SR) 100 MG 12 hr tablet Take 100 mg by mouth every morning.   . Cholecalciferol (VITAMIN  D-3) 125 MCG (5000 UT) TABS Take 5,000 Units by mouth daily.   . Multiple Vitamin (MULTIVITAMIN WITH MINERALS) TABS tablet Take 1 tablet by mouth daily. Alive Multivitamin  . rosuvastatin (CRESTOR) 20 MG tablet TAKE 1 TABLET DAILY (NEED APPOINTMENT) (Patient taking differently: Take 20 mg by mouth at bedtime. )  . sertraline (ZOLOFT) 50 MG tablet Take 1 tablet (50 mg total) by mouth daily. (Patient taking differently: Take 25 mg by mouth every morning. )  . traZODone (DESYREL) 50 MG tablet Take 1-2 tablets (50-100 mg total) by mouth at bedtime. (Patient taking differently: Take 75 mg by mouth at bedtime. )  . [DISCONTINUED] OVER THE COUNTER MEDICATION Place 1 Dose under the tongue daily as needed.    PHQ 2/9 Scores 09/19/2019 08/13/2019 05/22/2019 12/28/2018  PHQ - 2 Score 0 0 0 0  PHQ- 9 Score 3 3 0 0    BP Readings from Last 3 Encounters:  09/19/19 100/62  08/30/19 130/79  08/15/19 111/74    Physical Exam Vitals and nursing note reviewed.  Constitutional:      General: She is not irritable.    Appearance: She is well-developed.  HENT:     Head: Normocephalic.     Right Ear: Tympanic membrane, ear canal and external ear normal.     Left Ear: Tympanic membrane,  ear canal and external ear normal.     Nose: Nose normal.     Mouth/Throat:     Mouth: Mucous membranes are moist.  Eyes:     General: Lids are everted, no foreign bodies appreciated. No scleral icterus.       Left eye: No foreign body or hordeolum.     Conjunctiva/sclera: Conjunctivae normal.     Right eye: Right conjunctiva is not injected.     Left eye: Left conjunctiva is not injected.     Pupils: Pupils are equal, round, and reactive to light.  Neck:     Thyroid: No thyromegaly.     Vascular: No JVD.     Trachea: No tracheal deviation.  Cardiovascular:     Rate and Rhythm: Normal rate and regular rhythm.     Heart sounds: Normal heart sounds. No murmur. No friction rub. No gallop.   Pulmonary:     Effort: Pulmonary  effort is normal. No respiratory distress.     Breath sounds: Normal breath sounds. No wheezing, rhonchi or rales.  Abdominal:     General: Bowel sounds are normal.     Palpations: Abdomen is soft. There is no mass.     Tenderness: There is no abdominal tenderness. There is no guarding or rebound.  Musculoskeletal:        General: No tenderness. Normal range of motion.     Cervical back: Normal range of motion and neck supple.  Lymphadenopathy:     Cervical: No cervical adenopathy.  Skin:    General: Skin is warm.     Findings: No rash.  Neurological:     Mental Status: She is alert and oriented to person, place, and time.     Cranial Nerves: No cranial nerve deficit.     Deep Tendon Reflexes: Reflexes normal.  Psychiatric:        Mood and Affect: Mood is not anxious or depressed.     Wt Readings from Last 3 Encounters:  09/19/19 102 lb (46.3 kg)  08/30/19 103 lb (46.7 kg)  08/15/19 103 lb (46.7 kg)    BP 100/62   Pulse 80   Ht 5\' 3"  (1.6 m)   Wt 102 lb (46.3 kg)   BMI 18.07 kg/m   Assessment and Plan: 1. Mixed hyperlipidemia Chronic.  Controlled.  Stable.  Continue rosuvastatin 20 mg once a day.  Will check lipid panel. - rosuvastatin (CRESTOR) 20 MG tablet; Take 1 tablet (20 mg total) by mouth at bedtime.  Dispense: 90 tablet; Refill: 1 - Lipid Panel With LDL/HDL Ratio  2. Major depressive disorder in partial remission, unspecified whether recurrent (HCC) Chronic.  Controlled.  Stable.  PHQ 3.  Gad score 3.  Patient has upcoming surgery event that she has some anxiety thereof.  Will continue sertraline 25 mg once a day as well as trazodone 50 mg 1.5 mg daily. - sertraline (ZOLOFT) 25 MG tablet; Take 1 tablet (25 mg total) by mouth every morning.  Dispense: 90 tablet; Refill: 1 - traZODone (DESYREL) 50 MG tablet; Take 1.5 tablets (75 mg total) by mouth at bedtime.  Dispense: 135 tablet; Refill: 1  3. Primary insomnia Chronic.  Controlled.  Stable.  Patient doing  extremely well on current dosing of trazodone 50 mg 1/2 tablet for a total of 75 mg nightly.

## 2019-09-20 LAB — LIPID PANEL WITH LDL/HDL RATIO
Cholesterol, Total: 171 mg/dL (ref 100–199)
HDL: 74 mg/dL (ref >39)
LDL Chol Calc (NIH): 86 mg/dL (ref 0–99)
LDL/HDL Ratio: 1.2 ratio (ref 0.0–3.2)
Triglycerides: 54 mg/dL (ref 0–149)
VLDL Cholesterol Cal: 11 mg/dL (ref 5–40)

## 2019-09-21 ENCOUNTER — Ambulatory Visit
Admission: RE | Admit: 2019-09-21 | Discharge: 2019-09-21 | Disposition: A | Discharge status: Home / Self Care | Payer: Medicare Other | Attending: Urology | Admitting: Urology

## 2019-09-21 ENCOUNTER — Other Ambulatory Visit: Payer: Self-pay

## 2019-09-21 ENCOUNTER — Ambulatory Visit: Payer: Medicare Other

## 2019-09-21 ENCOUNTER — Encounter
Admission: RE | Disposition: A | Discharge status: Home / Self Care | Payer: Self-pay | Source: Home / Self Care | Attending: Urology

## 2019-09-21 ENCOUNTER — Ambulatory Visit: Payer: Medicare Other | Admitting: Certified Registered Nurse Anesthetist

## 2019-09-21 ENCOUNTER — Encounter: Payer: Self-pay | Admitting: Urology

## 2019-09-21 DIAGNOSIS — Z681 Body mass index (BMI) 19 or less, adult: Secondary | ICD-10-CM | POA: Diagnosis not present

## 2019-09-21 DIAGNOSIS — N201 Calculus of ureter: Secondary | ICD-10-CM

## 2019-09-21 DIAGNOSIS — M1990 Unspecified osteoarthritis, unspecified site: Secondary | ICD-10-CM | POA: Diagnosis not present

## 2019-09-21 DIAGNOSIS — Z8673 Personal history of transient ischemic attack (TIA), and cerebral infarction without residual deficits: Secondary | ICD-10-CM | POA: Diagnosis not present

## 2019-09-21 DIAGNOSIS — Z853 Personal history of malignant neoplasm of breast: Secondary | ICD-10-CM | POA: Diagnosis not present

## 2019-09-21 DIAGNOSIS — R310 Gross hematuria: Secondary | ICD-10-CM | POA: Insufficient documentation

## 2019-09-21 DIAGNOSIS — E669 Obesity, unspecified: Secondary | ICD-10-CM | POA: Diagnosis not present

## 2019-09-21 DIAGNOSIS — N135 Crossing vessel and stricture of ureter without hydronephrosis: Secondary | ICD-10-CM | POA: Diagnosis present

## 2019-09-21 DIAGNOSIS — M810 Age-related osteoporosis without current pathological fracture: Secondary | ICD-10-CM | POA: Insufficient documentation

## 2019-09-21 HISTORY — PX: URETEROSCOPY: SHX842

## 2019-09-21 HISTORY — PX: CYSTOSCOPY W/ URETERAL STENT PLACEMENT: SHX1429

## 2019-09-21 SURGERY — URETEROSCOPY
Anesthesia: General | Site: Ureter | Laterality: Left

## 2019-09-21 MED ORDER — FENTANYL CITRATE (PF) 100 MCG/2ML IJ SOLN
25.0000 ug | INTRAMUSCULAR | Status: DC | PRN
  Administered 2019-09-21 (×3): 25 ug via INTRAVENOUS

## 2019-09-21 MED ORDER — IOHEXOL 180 MG/ML  SOLN
INTRAMUSCULAR | Status: DC | PRN
  Administered 2019-09-21: 20 mL

## 2019-09-21 MED ORDER — MIDAZOLAM HCL 2 MG/2ML IJ SOLN
INTRAMUSCULAR | Status: DC | PRN
  Administered 2019-09-21: 2 mg via INTRAVENOUS

## 2019-09-21 MED ORDER — OXYBUTYNIN CHLORIDE ER 10 MG PO TB24
10.0000 mg | ORAL_TABLET | Freq: Every day | ORAL | 0 refills | Status: AC | PRN
Start: 2019-09-21 — End: 2019-10-01

## 2019-09-21 MED ORDER — FAMOTIDINE 20 MG PO TABS
ORAL_TABLET | ORAL | Status: AC
  Administered 2019-09-21: 20 mg via ORAL
  Filled 2019-09-21: qty 1

## 2019-09-21 MED ORDER — CHLORHEXIDINE GLUCONATE 0.12 % MT SOLN: 15.0000 mL | Freq: Once | OROMUCOSAL | Status: AC

## 2019-09-21 MED ORDER — LIDOCAINE HCL (CARDIAC) PF 100 MG/5ML IV SOSY
PREFILLED_SYRINGE | INTRAVENOUS | Status: DC | PRN
  Administered 2019-09-21: 40 mg via INTRAVENOUS

## 2019-09-21 MED ORDER — CEFAZOLIN SODIUM-DEXTROSE 2-4 GM/100ML-% IV SOLN
2.0000 g | INTRAVENOUS | Status: AC
  Administered 2019-09-21: 2 g via INTRAVENOUS

## 2019-09-21 MED ORDER — ROCURONIUM BROMIDE 100 MG/10ML IV SOLN
INTRAVENOUS | Status: DC | PRN
  Administered 2019-09-21: 30 mg via INTRAVENOUS

## 2019-09-21 MED ORDER — HYDROCODONE-ACETAMINOPHEN 5-325 MG PO TABS: 1.0000 | ORAL_TABLET | ORAL | 0 refills | Status: AC | PRN

## 2019-09-21 MED ORDER — MIDAZOLAM HCL 2 MG/2ML IJ SOLN
INTRAMUSCULAR | Status: AC
  Filled 2019-09-21: qty 2

## 2019-09-21 MED ORDER — CEFAZOLIN SODIUM-DEXTROSE 2-4 GM/100ML-% IV SOLN
INTRAVENOUS | Status: AC
  Filled 2019-09-21: qty 100

## 2019-09-21 MED ORDER — CHLORHEXIDINE GLUCONATE 0.12 % MT SOLN
OROMUCOSAL | Status: AC
  Administered 2019-09-21: 15 mL via OROMUCOSAL
  Filled 2019-09-21: qty 15

## 2019-09-21 MED ORDER — SUGAMMADEX SODIUM 200 MG/2ML IV SOLN
INTRAVENOUS | Status: DC | PRN
  Administered 2019-09-21: 92 mg via INTRAVENOUS

## 2019-09-21 MED ORDER — KETOROLAC TROMETHAMINE 15 MG/ML IJ SOLN
INTRAMUSCULAR | Status: AC
  Administered 2019-09-21: 15 mg
  Filled 2019-09-21: qty 1

## 2019-09-21 MED ORDER — BELLADONNA ALKALOIDS-OPIUM 16.2-60 MG RE SUPP
RECTAL | Status: DC | PRN
  Administered 2019-09-21: 1 via RECTAL

## 2019-09-21 MED ORDER — LACTATED RINGERS IV SOLN: INTRAVENOUS | Status: DC

## 2019-09-21 MED ORDER — SULFAMETHOXAZOLE-TRIMETHOPRIM 800-160 MG PO TABS
1.0000 | ORAL_TABLET | Freq: Every day | ORAL | 0 refills | Status: DC
Start: 2019-09-21 — End: 2019-11-28

## 2019-09-21 MED ORDER — ONDANSETRON HCL 4 MG/2ML IJ SOLN
INTRAMUSCULAR | Status: DC | PRN
  Administered 2019-09-21: 4 mg via INTRAVENOUS

## 2019-09-21 MED ORDER — KETOROLAC TROMETHAMINE 15 MG/ML IJ SOLN: 15.0000 mg | Freq: Once | INTRAMUSCULAR | Status: DC

## 2019-09-21 MED ORDER — ONDANSETRON HCL 4 MG/2ML IJ SOLN: 4.0000 mg | Freq: Once | INTRAMUSCULAR | Status: DC | PRN

## 2019-09-21 MED ORDER — ORAL CARE MOUTH RINSE: 15.0000 mL | Freq: Once | OROMUCOSAL | Status: AC

## 2019-09-21 MED ORDER — FENTANYL CITRATE (PF) 100 MCG/2ML IJ SOLN
INTRAMUSCULAR | Status: DC | PRN
  Administered 2019-09-21: 25 ug via INTRAVENOUS

## 2019-09-21 MED ORDER — DEXAMETHASONE SODIUM PHOSPHATE 10 MG/ML IJ SOLN
INTRAMUSCULAR | Status: DC | PRN
  Administered 2019-09-21: 10 mg via INTRAVENOUS

## 2019-09-21 MED ORDER — OXYBUTYNIN CHLORIDE ER 10 MG PO TB24
10.0000 mg | ORAL_TABLET | Freq: Once | ORAL | Status: DC
  Filled 2019-09-21: qty 1

## 2019-09-21 MED ORDER — FENTANYL CITRATE (PF) 100 MCG/2ML IJ SOLN
INTRAMUSCULAR | Status: AC
  Filled 2019-09-21: qty 2

## 2019-09-21 MED ORDER — FAMOTIDINE 20 MG PO TABS: 20.0000 mg | ORAL_TABLET | Freq: Once | ORAL | Status: AC

## 2019-09-21 MED ORDER — BELLADONNA ALKALOIDS-OPIUM 16.2-60 MG RE SUPP
RECTAL | Status: AC
  Filled 2019-09-21: qty 1

## 2019-09-21 MED ORDER — PROPOFOL 10 MG/ML IV BOLUS
INTRAVENOUS | Status: DC | PRN
  Administered 2019-09-21: 100 mg via INTRAVENOUS

## 2019-09-21 SURGICAL SUPPLY — 42 items
BAG DRAIN CYSTO-URO LG1000N (MISCELLANEOUS) ×5 IMPLANT
BRUSH SCRUB EZ  4% CHG (MISCELLANEOUS) ×5
BRUSH SCRUB EZ 1% IODOPHOR (MISCELLANEOUS) ×5 IMPLANT
BRUSH SCRUB EZ 4% CHG (MISCELLANEOUS) ×3 IMPLANT
BULB IRRIG PATHFIND (MISCELLANEOUS) IMPLANT
CATH URETL 5X70 OPEN END (CATHETERS) ×5 IMPLANT
CNTNR SPEC 2.5X3XGRAD LEK (MISCELLANEOUS) ×3
CONRAY 43 FOR UROLOGY 50M (MISCELLANEOUS) ×1 IMPLANT
CONT SPEC 4OZ STER OR WHT (MISCELLANEOUS) ×2
CONT SPEC 4OZ STRL OR WHT (MISCELLANEOUS) ×3
CONTAINER SPEC 2.5X3XGRAD LEK (MISCELLANEOUS) ×3 IMPLANT
DRAPE UTILITY 15X26 TOWEL STRL (DRAPES) ×5 IMPLANT
DRSG TELFA 4X3 1S NADH ST (GAUZE/BANDAGES/DRESSINGS) ×5 IMPLANT
ELECT REM PT RETURN 9FT ADLT (ELECTROSURGICAL) ×5
ELECTRODE REM PT RTRN 9FT ADLT (ELECTROSURGICAL) ×3 IMPLANT
GLOVE BIOGEL PI IND STRL 7.5 (GLOVE) ×3 IMPLANT
GLOVE BIOGEL PI INDICATOR 7.5 (GLOVE) ×2
GOWN STRL REUS W/ TWL LRG LVL3 (GOWN DISPOSABLE) ×3 IMPLANT
GOWN STRL REUS W/ TWL XL LVL3 (GOWN DISPOSABLE) ×3 IMPLANT
GOWN STRL REUS W/TWL LRG LVL3 (GOWN DISPOSABLE) ×5
GOWN STRL REUS W/TWL XL LVL3 (GOWN DISPOSABLE) ×5
GUIDEWIRE GREEN .038 145CM (MISCELLANEOUS) ×1 IMPLANT
GUIDEWIRE INTRO SET STRAIGHT (WIRE) ×4 IMPLANT
GUIDEWIRE STR DUAL SENSOR (WIRE) ×5 IMPLANT
INFUSOR MANOMETER BAG 3000ML (MISCELLANEOUS) ×5 IMPLANT
INTRODUCER DILATOR DOUBLE (INTRODUCER) IMPLANT
KIT TURNOVER CYSTO (KITS) ×5 IMPLANT
PACK CYSTO AR (MISCELLANEOUS) ×5 IMPLANT
SET CYSTO W/LG BORE CLAMP LF (SET/KITS/TRAYS/PACK) ×5 IMPLANT
SHEATH URETERAL 12FRX35CM (MISCELLANEOUS) IMPLANT
SOL .9 NS 3000ML IRR  AL (IV SOLUTION) ×5
SOL .9 NS 3000ML IRR AL (IV SOLUTION) ×3
SOL .9 NS 3000ML IRR UROMATIC (IV SOLUTION) ×3 IMPLANT
STENT URET 6FRX24 CONTOUR (STENTS) ×4 IMPLANT
STENT URET 6FRX26 CONTOUR (STENTS) IMPLANT
SURGILUBE 2OZ TUBE FLIPTOP (MISCELLANEOUS) ×5 IMPLANT
SYRINGE IRR TOOMEY STRL 70CC (SYRINGE) IMPLANT
TUBING ART PRESS 48 MALE/FEM (TUBING) IMPLANT
VALVE UROSEAL ADJ ENDO (VALVE) ×4 IMPLANT
WATER STERILE IRR 1000ML POUR (IV SOLUTION) ×5 IMPLANT
WATER STERILE IRR 3000ML UROMA (IV SOLUTION) ×5 IMPLANT
WIRE AMPLATZ SSTIFF .035X260CM (WIRE) ×4 IMPLANT

## 2019-09-21 NOTE — Anesthesia Postprocedure Evaluation (Signed)
Anesthesia Post Note  Patient: IJEOMA LOOR  Procedure(s) Performed: URETEROSCOPY-DIAGNOSTIC (Left Ureter) CYSTOSCOPY WITH RETROGRADE PYELOGRAM/URETERAL STENT PLACEMENT (Left Ureter)  Patient location during evaluation: PACU Anesthesia Type: General Level of consciousness: awake and alert and oriented Pain management: pain level controlled Vital Signs Assessment: post-procedure vital signs reviewed and stable Respiratory status: spontaneous breathing, nonlabored ventilation and respiratory function stable Cardiovascular status: blood pressure returned to baseline and stable Postop Assessment: no signs of nausea or vomiting Anesthetic complications: no   No complications documented.   Last Vitals:  Vitals:   09/21/19 1126 09/21/19 1135  BP:  (!) 144/81  Pulse: (!) 53 73  Resp: 18 18  Temp: 36.4 C (!) 36.3 C  SpO2: 100% 98%    Last Pain:  Vitals:   09/21/19 1135  TempSrc: Tympanic  PainSc: 0-No pain                 Boysie Bonebrake

## 2019-09-21 NOTE — Transfer of Care (Signed)
Immediate Anesthesia Transfer of Care Note  Patient: Amanda Osborn  Procedure(s) Performed: URETEROSCOPY-DIAGNOSTIC (Left Ureter) CYSTOSCOPY WITH RETROGRADE PYELOGRAM/URETERAL STENT PLACEMENT (Left Ureter)  Patient Location: PACU  Anesthesia Type:General  Level of Consciousness: awake, alert  and oriented  Airway & Oxygen Therapy: Patient Spontanous Breathing and Patient connected to face mask oxygen  Post-op Assessment: Report given to RN and Post -op Vital signs reviewed and stable  Post vital signs: Reviewed and stable  Last Vitals:  Vitals Value Taken Time  BP 151/95 09/21/19 1029  Temp    Pulse 64 09/21/19 1030  Resp 14 09/21/19 1030  SpO2 100 % 09/21/19 1030  Vitals shown include unvalidated device data.  Last Pain:  Vitals:   09/21/19 0751  TempSrc: Tympanic  PainSc: 0-No pain         Complications: No complications documented.

## 2019-09-21 NOTE — Discharge Instructions (Signed)

## 2019-09-21 NOTE — Anesthesia Procedure Notes (Signed)
Procedure Name: Intubation Date/Time: 09/21/2019 9:44 AM Performed by: Willette Alma, CRNA Pre-anesthesia Checklist: Patient identified, Patient being monitored, Timeout performed, Emergency Drugs available and Suction available Patient Re-evaluated:Patient Re-evaluated prior to induction Oxygen Delivery Method: Circle system utilized Preoxygenation: Pre-oxygenation with 100% oxygen Induction Type: IV induction Ventilation: Mask ventilation without difficulty Laryngoscope Size: Mac and 3 Grade View: Grade I Tube type: Oral Tube size: 6.5 mm Number of attempts: 1 Airway Equipment and Method: Stylet Placement Confirmation: ETT inserted through vocal cords under direct vision,  positive ETCO2 and breath sounds checked- equal and bilateral Secured at: 21 cm Tube secured with: Tape Dental Injury: Teeth and Oropharynx as per pre-operative assessment

## 2019-09-21 NOTE — Op Note (Signed)
Date of procedure: 09/21/19  Preoperative diagnosis:  1. Left UPJ obstruction  Postoperative diagnosis:  1. Same  Procedure: 1. Cystoscopy, left retrograde pyelogram with intraoperative interpretation, left diagnostic ureteroscopy, left ureteral stent placement  Surgeon: Brian Sninsky, MD  Anesthesia: General  Complications: None  Intraoperative findings:  1.  Normal cystoscopy, no lesions 2.  Left retrograde pyelogram with no significant filling defects or significant hydronephrosis on the left side, no convincing UPJ configuration 3.  Ureteroscope only able to pass to mid ureter secondary to narrowing, stent placed  EBL: None  Specimens: None  Drains: Left 6 French by 24 cm ureteral stone  Indication: Amanda Osborn is a 64 y.o. patient with gross hematuria and a possible left UPJ obstruction versus filling defect on CT urogram.  Cystoscopy in clinic was normal, and cytology was negative.  After reviewing the management options for treatment, they elected to proceed with the above surgical procedure(s). We have discussed the potential benefits and risks of the procedure, side effects of the proposed treatment, the likelihood of the patient achieving the goals of the procedure, and any potential problems that might occur during the procedure or recuperation. Informed consent has been obtained.  Description of procedure:  The patient was taken to the operating room and general anesthesia was induced. SCDs were placed for DVT prophylaxis. The patient was placed in the dorsal lithotomy position, prepped and draped in the usual sterile fashion, and preoperative antibiotics were administered. A preoperative time-out was performed.   A 21 French rigid cystoscope was used to intubate the urethra and cystoscopy was performed.  The bladder was grossly normal and the ureteral orifice ease were orthotopic bilaterally.  There was brisk drainage of yellow urine from both ureters.  I  started by performing a left retrograde pyelogram via a 5 French access catheter on the left side and this showed no significant filling defects or narrowing at the UPJ, and there was no hydronephrosis.  Overall was a grossly normal retrograde pyelogram without any concerning features.  I opted to advance a sensor wire up into the kidney to see if we could perform diagnostic ureteroscopy to completely rule out any evidence of malignancy.  The sensor wire advanced easily up into the upper pole.  I attempted to pass the single channel flexible ureteroscope but met resistance at the distal ureter.  The sensor wire was exchanged for a Super Stiff wire using the 5 French access catheter.  The single-channel ureteroscope again met resistance at the distal ureter.  I then performed dilation using the 8/10 French dilators.  At this point I was able to advance the scope slightly further to the mid ureter, but again met resistance.  Under direct vision the ureter was diffusely narrow but there was no evidence of malignancy or other worrisome features.  A retrograde pyelogram was again performed and showed no extravasation and no concerning findings at the proximal ureter kidney and no hydronephrosis.  At this point I opted to place a 6 French by 24 ureteral stent on the left side and there was an excellent curl in the upper pole, as well as under direct vision the bladder, and contrast drained through the side ports of the stent.  The bladder was drained and a belladonna suppository was placed.  Disposition: Stable to PACU  Plan: Follow-up in clinic in 1 week-we will discuss repeat ureteroscopy in 2 to 3 weeks for direct vision of the proximal ureter and UPJ, versus stent removal and close follow-up   with renal ultrasound.  Very low suspicion for malignancy in the setting of her negative cytology and normal retrograde in the OR today.   Brian Sninsky, MD   

## 2019-09-21 NOTE — Anesthesia Preprocedure Evaluation (Signed)
Anesthesia Evaluation  Patient identified by MRN, date of birth, ID band Patient awake    Reviewed: Allergy & Precautions, NPO status , Patient's Chart, lab work & pertinent test results  History of Anesthesia Complications Negative for: history of anesthetic complications  Airway Mallampati: II  TM Distance: >3 FB Neck ROM: Full    Dental no notable dental hx.    Pulmonary neg pulmonary ROS, neg sleep apnea, neg COPD,    breath sounds clear to auscultation- rhonchi (-) wheezing      Cardiovascular Exercise Tolerance: Good (-) hypertension(-) CAD, (-) Past MI, (-) Cardiac Stents and (-) CABG  Rhythm:Regular Rate:Normal - Systolic murmurs and - Diastolic murmurs    Neuro/Psych  Headaches, Anxiety CVA, No Residual Symptoms    GI/Hepatic negative GI ROS, Neg liver ROS,   Endo/Other  negative endocrine ROSneg diabetes  Renal/GU negative Renal ROS     Musculoskeletal  (+) Arthritis ,   Abdominal (+) - obese,   Peds  Hematology negative hematology ROS (+)   Anesthesia Other Findings Past Medical History: No date: Allergy No date: Anxiety No date: Arthritis 2006: Breast cancer ([place])     Comment:  BIL BREASTS-LOBULAR CARCINOMA IN SITU No date: Dizziness No date: Headache No date: History of CVA (cerebrovascular accident) No date: Osteoporosis 2019: Stroke ([place])     Comment:  NO RESIDUAL EFFECTS   Reproductive/Obstetrics                             Anesthesia Physical Anesthesia Plan  ASA: II  Anesthesia Plan: General   Post-op Pain Management:    Induction: Intravenous  PONV Risk Score and Plan: 2 and Ondansetron and Dexamethasone  Airway Management Planned: Oral ETT  Additional Equipment:   Intra-op Plan:   Post-operative Plan: Extubation in OR  Informed Consent: I have reviewed the patients History and Physical, chart, labs and discussed the procedure including the risks,  benefits and alternatives for the proposed anesthesia with the patient or authorized representative who has indicated his/her understanding and acceptance.     Dental advisory given  Plan Discussed with: CRNA and Anesthesiologist  Anesthesia Plan Comments:         Anesthesia Quick Evaluation

## 2019-09-21 NOTE — H&P (Signed)
   09/21/19 9:26 AM   Amanda Osborn 03/20/1956 [id number]  CC: Left UPJ thickening, gross hematuria  HPI: Amanda Osborn is a healthy 64 year old female who presented with gross hematuria, and on CT was found to have some thickening of the left UPJ with a possible chronic UPJ obstruction versus malignancy.  Cystoscopy in clinic was negative, and cytology was negative.  She presents today for diagnostic ureteroscopy for definitive diagnosis.   PMH: Past Medical History:  Diagnosis Date  . Allergy   . Anxiety   . Arthritis   . Breast cancer ([place]) 2006   BIL BREASTS-LOBULAR CARCINOMA IN SITU  . Dizziness   . Headache   . History of CVA (cerebrovascular accident)   . Osteoporosis   . Stroke ([place]) 2019   NO RESIDUAL EFFECTS    Surgical History: Past Surgical History:  Procedure Laterality Date  . AUGMENTATION MAMMAPLASTY Bilateral 2011  . BREAST CYST ASPIRATION Bilateral   . BREAST EXCISIONAL BIOPSY Left 2006   breast ca  . BREAST LUMPECTOMY    . CARDIAC CATHETERIZATION    . COLONOSCOPY    . COLONOSCOPY WITH PROPOFOL N/A 06/28/2016   Procedure: COLONOSCOPY WITH PROPOFOL;  Surgeon: Lollie Sails, MD;  Location: [facility] ENDOSCOPY;  Service: Endoscopy;  Laterality: N/A;  . LYMPH NODE BIOPSY     NECK  . MASTECTOMY Bilateral   . TEE WITHOUT CARDIOVERSION N/A 12/30/2017   Procedure: TRANSESOPHAGEAL ECHOCARDIOGRAM (TEE);  Surgeon: Teodoro Spray, MD;  Location: [facility] ORS;  Service: Cardiovascular;  Laterality: N/A;  . VAGINAL HYSTERECTOMY      Family History: Family History  Problem Relation Age of Onset  . Breast cancer Paternal Aunt   . Breast cancer Paternal Grandmother   . Dementia Mother        passed at age 82  . Leukemia Father        passed at age 41  . Leukemia Brother     Social History:  reports that she has never smoked. She has never used smokeless tobacco. She reports that she does not drink alcohol and does not use drugs.  Physical Exam: BP 126/69    Pulse 68   Temp 97.8 F (36.6 C) (Tympanic)   Resp 14   Ht 5\' 3"  (1.6 m)   Wt 46 kg   SpO2 99%   BMI 17.96 kg/m    Constitutional:  Alert and oriented, No acute distress. Cardiovascular: Regular rate and rhythm Respiratory: Clear to auscultation bilaterally GI: Abdomen is soft, nontender, nondistended, no abdominal masses  Laboratory Data: Urine culture 09/11/2019 mixed flora <25k  Pertinent Imaging: I have personally reviewed the CT, see HPI  Assessment & Plan:   Healthy 64 year old female with no risk factors for urothelial cell carcinoma with multiple episodes of gross hematuria that have since resolved.  She has no flank pain and denies any urinary complaints.  Renal function is normal with a creatinine of 0.8.  We reviewed her CT urogram results today that show a possible left UPJ obstruction and we discussed possible etiologies of these including chronic, idiopathic, or even malignancy.  There is no prior cross-sectional imaging to review chronicity.  Left retrograde pyelogram, diagnostic ureteroscopy, possible biopsy, possible stent placement  Nickolas Madrid, MD 09/21/2019  [place] [address], [place], [place] [address] [phone]

## 2019-09-22 ENCOUNTER — Encounter: Payer: Self-pay | Admitting: Urology

## 2019-09-24 ENCOUNTER — Telehealth: Payer: Self-pay | Admitting: Radiology

## 2019-09-24 ENCOUNTER — Other Ambulatory Visit: Payer: Self-pay | Admitting: Urology

## 2019-09-24 MED ORDER — HYDROCODONE-ACETAMINOPHEN 5-325 MG PO TABS: 1.0000 | ORAL_TABLET | Freq: Four times a day (QID) | ORAL | 0 refills | Status: AC | PRN

## 2019-09-24 NOTE — Telephone Encounter (Signed)
Notified patient of script sent to pharmacy. 

## 2019-09-24 NOTE — Telephone Encounter (Signed)
error 

## 2019-09-24 NOTE — Telephone Encounter (Signed)
Patient reports pain and hematuria that is "between pink and dark". Reassured patient this is normal following surgery on 09/21/2019. Encouraged use of either hydrocodone or Tylenol for pain. Patient verbalized understanding. Patient requests refill of hydrocodone.

## 2019-09-24 NOTE — Telephone Encounter (Signed)
Encouraged increasing fluid intake for hematuria.

## 2019-09-24 NOTE — Telephone Encounter (Signed)
Norco refilled  Nickolas Madrid, MD 09/24/2019

## 2019-09-26 ENCOUNTER — Other Ambulatory Visit: Payer: Self-pay

## 2019-09-26 ENCOUNTER — Ambulatory Visit (INDEPENDENT_AMBULATORY_CARE_PROVIDER_SITE_OTHER): Payer: Medicare Other | Admitting: Urology

## 2019-09-26 ENCOUNTER — Encounter: Payer: Self-pay | Admitting: Urology

## 2019-09-26 VITALS — BP 115/71 | HR 66 | Ht 63.0 in | Wt 100.0 lb

## 2019-09-26 DIAGNOSIS — N201 Calculus of ureter: Secondary | ICD-10-CM

## 2019-09-26 DIAGNOSIS — R310 Gross hematuria: Secondary | ICD-10-CM

## 2019-09-26 DIAGNOSIS — Q6239 Other obstructive defects of renal pelvis and ureter: Secondary | ICD-10-CM

## 2019-09-26 NOTE — Patient Instructions (Addendum)
Hydronephrosis  Hydronephrosis is the swelling of one or both kidneys due to a blockage that stops urine from flowing out of the body. Kidneys filter waste from the blood and produce urine. This condition can lead to kidney failure and may become life threatening if not treated promptly. What are the causes? Common causes of this condition include:  Problems that occur when a baby is developing in the womb (congenital defect). These can include problems: ? In the kidneys. ? In the tubes that drain urine from the kidneys into the bladder (ureters).  Kidney stones.  Bladder infection.  An enlarged prostate gland.  Scar tissue from a previous surgery or injury.  A blood clot.  A tumor or cyst in the abdomen or pelvis.  Cancer of the prostate, bladder, uterus, ovary, or colon. What are the signs or symptoms? Symptoms of this condition include:  Pain or discomfort in your side (flank).  Pain and swelling in your abdomen.  Nausea and vomiting.  Fever.  Pain when passing urine.  Feelings of urgency when you need to urinate.  Urinating more often than normal. In some cases, you may not have any symptoms. How is this diagnosed? This condition may be diagnosed based on:  Your symptoms and medical history.  A physical exam.  Blood and urine tests.  Imaging tests, such as an ultrasound, CT scan, or MRI.  A procedure in which a scope is inserted into the urethra and used to view parts of the urinary tract and bladder (cystoscopy). How is this treated? Treatment for this condition depends on where the blockage is, how long it has been there, and what caused it. The goal of treatment is to remove the blockage. Treatment may include:  Antibiotic medicines to treat or prevent infection.  A procedure to place a small, thin tube (stent) into a blocked ureter. The stent will keep the ureter open so that urine can drain through it.  A nonsurgical procedure that crushes kidney  stones with shock waves (extracorporeal shock wave lithotripsy).  If kidney failure occurs, treatment may include dialysis or a kidney transplant. Follow these instructions at home:   Take over-the-counter and prescription medicines only as told by your health care provider.  Rest and return to your normal activities as told by your health care provider. Ask your health care provider what activities are safe for you.  Drink enough fluid to keep your urine pale yellow.  If you were prescribed an antibiotic medicine, take it exactly as told by your health care provider. Do not stop taking the antibiotic even if you start to feel better.  Keep all follow-up visits as told by your health care provider. This is important. Contact a health care provider if:  You continue to have symptoms after treatment.  You develop new symptoms.  Your urine becomes cloudy or bloody.  You have a fever. Get help right away if:  You have severe flank or abdominal pain.  You cannot drink fluids without vomiting. Summary  Hydronephrosis is the swelling of one or both kidneys due to a blockage that stops urine from flowing out of the body.  Hydronephrosis can lead to kidney failure and may become life threatening if not treated promptly.  The goal of treatment is to treat the cause of the blockage. It may include insertion of stent into a blocked ureter, a procedure to treat kidney stones, and antibiotic medicines.  Follow your health care provider's instructions for taking care of yourself at   home, including instructions about drinking fluids, taking medicines, and limiting activities. This information is not intended to replace advice given to you by your health care provider. Make sure you discuss any questions you have with your health care provider. Document Revised: 04/09/2017 Document Reviewed: 04/09/2017 Elsevier Patient Education  2020 Elsevier Inc[employer]  

## 2019-09-26 NOTE — Addendum Note (Signed)
Addended by: Despina Hidden on: 09/26/2019 11:05 AM   Modules accepted: Orders

## 2019-09-26 NOTE — Progress Notes (Signed)
Cystoscopy Procedure Note:  Indication: Stent removal s/p left retrograde program and attempted left diagnostic ureteroscopy   To briefly summarize, she is a healthy 64 year old never smoker who presented in May with gross hematuria and CT urogram showed a subtly dilated left renal pelvis with possible UPJ configuration.  Urothelial neoplasm could not be excluded.  Pre-operatively she did not have any flank pain and renal function was normal.  Cystoscopy was negative, and cytology was negative.  She opted for diagnostic left ureteroscopy.  A retrograde pyelogram was performed at that time which was essentially normal and showed no filling defects or significant hydronephrosis.  I attempted to perform left ureteroscopy, however could not access the collecting system even with ureteral dilation.  A stent was placed.  We had a long conversation today about options including stent removal today with close follow-up ultrasound, versus repeat ureteroscopy for definitive diagnosis.  She has not tolerated her stent well with significant flank pain, and would like to defer repeat ureteroscopy which I think is reasonable in the setting of her negative cytology and negative retrograde pyelogram at time of surgery last week.  We discussed return precautions at length.  After informed consent and discussion of the procedure and its risks, Amanda Osborn was positioned and prepped in the standard fashion. Cystoscopy was performed with a flexible cystoscope. The stent was grasped with flexible graspers and removed in its entirety. The patient tolerated the procedure well.  Findings: Uncomplicated stent removal  Assessment and Plan: Follow up in 8 weeks with renal ultrasound   Billey Co, MD 09/26/2019

## 2019-10-01 LAB — MICROSCOPIC EXAMINATION
Bacteria, UA: NONE SEEN
RBC, Urine: >30 /hpf — AB (ref 0–2)

## 2019-10-01 LAB — URINALYSIS, COMPLETE
Specific Gravity, UA: 1.025 (ref 1.005–1.030)
pH, UA: 6 (ref 5.0–7.5)

## 2019-10-04 ENCOUNTER — Telehealth: Payer: Self-pay | Admitting: *Deleted

## 2019-10-04 NOTE — Telephone Encounter (Signed)
Would recommend NSAIDs if still having pain, shouldn't still be needing narcotics. If not better in the next week should call us. We have follow up in August with renal US prior, thanks  Nickolas Madrid, MD 10/04/2019

## 2019-10-04 NOTE — Telephone Encounter (Signed)
Patient called Triage line concerned with some discomfort post stent removal on 09/26/19. She was vacuuming and cleaning yesterday, afterwards developed pain. Instructed pain after stent removal is normal. She has been taking Norco, which has helped, she has 2 left. Denies any other symptoms. She is requesting a refill. Instructed to try Tylenol as needed and stay hydrated.

## 2019-10-04 NOTE — Telephone Encounter (Signed)
Patient informed, voiced understanding.  °

## 2019-11-26 ENCOUNTER — Other Ambulatory Visit: Payer: Self-pay

## 2019-11-26 ENCOUNTER — Ambulatory Visit
Admission: RE | Admit: 2019-11-26 | Discharge: 2019-11-26 | Disposition: A | Discharge status: Home / Self Care | Payer: Medicare Other | Source: Ambulatory Visit | Attending: Urology | Admitting: Urology

## 2019-11-26 DIAGNOSIS — Q6239 Other obstructive defects of renal pelvis and ureter: Secondary | ICD-10-CM | POA: Insufficient documentation

## 2019-11-28 ENCOUNTER — Encounter: Payer: Self-pay | Admitting: Urology

## 2019-11-28 ENCOUNTER — Ambulatory Visit (INDEPENDENT_AMBULATORY_CARE_PROVIDER_SITE_OTHER): Payer: Medicare Other | Admitting: Urology

## 2019-11-28 ENCOUNTER — Other Ambulatory Visit: Payer: Self-pay

## 2019-11-28 VITALS — BP 102/69 | HR 99 | Ht 63.0 in | Wt 101.0 lb

## 2019-11-28 DIAGNOSIS — N1330 Unspecified hydronephrosis: Secondary | ICD-10-CM

## 2019-11-28 DIAGNOSIS — N201 Calculus of ureter: Secondary | ICD-10-CM | POA: Diagnosis not present

## 2019-11-28 NOTE — Progress Notes (Signed)
° °  11/28/2019 12:42 PM   Amanda Osborn 05-13-1955 [id number]  Reason for visit: Follow up left hydronephrosis  HPI: I saw [name] in urology clinic today for follow-up of left hydronephrosis.  Briefly she is a healthy 64 year old female with no smoking history or other risk factors who presented with gross hematuria in May 2021.  CT urogram suggested a possible left UPJ obstruction of unclear etiology and subtle left hydronephrosis.  There were no other abnormalities.  There is no prior imaging to review duration.  On 09/21/2019 she underwent a normal cystoscopy, and a left retrograde pyelogram in the operating room that showed no filling defects or significant hydronephrosis on the left side.  I was unable to pass the flexible ureteroscope up to the left kidney secondary to narrowing of the distal ureter, and a stent was placed.  She had significant bothersome stent related symptoms, and deferred a second look ureteroscopy for definitive diagnosis of her left UPJ abnormality.  Cytology was negative, and with her absence of risk factors and lack of symptoms, she was comfortable with close observation with follow-up with a repeat ultrasound.  She reports she has had some significant pelvic pain since her stent was removed, however this is resolved over the last few weeks.  She denies any flank pain or recurrence of gross hematuria.  Repeat renal ultrasound on 11/26/2019 showed mild left hydronephrosis compatible with probable UPJ obstruction on prior CT, and some minimally complex left cyst.  Her renal function is normal.  She would like to continue observation with a repeat ultrasound in 1 year which is very reasonable.  We discussed return precautions at length including recurrence of gross hematuria, or new flank pain, or recurrent UTIs.  If ultrasound stable next year, likely can discontinue further imaging, and I think this is most likely an asymptomatic congenital UPJ obstruction she is  always had.  RTC 1 year with renal ultrasound prior, sooner if any problems   Billey Co, MD  [facility] [place] [address], [place], [place] [address] [phone]

## 2020-01-07 ENCOUNTER — Ambulatory Visit (INDEPENDENT_AMBULATORY_CARE_PROVIDER_SITE_OTHER): Payer: Medicare Other

## 2020-01-07 DIAGNOSIS — Z0000 Encounter for general adult medical examination without abnormal findings: Secondary | ICD-10-CM

## 2020-01-07 DIAGNOSIS — Z780 Asymptomatic menopausal state: Secondary | ICD-10-CM

## 2020-01-07 NOTE — Patient Instructions (Signed)
Amanda Osborn , Thank you for taking time to come for your Medicare Wellness Visit. I appreciate your ongoing commitment to your health goals. Please review the following plan we discussed and let me know if I can assist you in the future.   Screening recommendations/referrals: Colonoscopy: done 06/28/16 Mammogram: done 03/19/19 Bone Density: Please call [phone] to schedule your bone density screening.  Recommended yearly ophthalmology/optometry visit for glaucoma screening and checkup Recommended yearly dental visit for hygiene and checkup  Vaccinations: Influenza vaccine: due Pneumococcal vaccine: done 12/28/18 Tdap vaccine: due Shingles vaccine: Shingrix discussed. Please contact your pharmacy for coverage information.  Covid-19: done 05/28/19 & 06/18/19  Advanced directives: Please bring a copy of your health care power of attorney and living will to the office at your convenience.  Conditions/risks identified: Recommend increasing physical activity to at least 3 days per week  Next appointment: Follow up in one year for your annual wellness visit    Preventive Care 65 Years and Older, Female Preventive care refers to lifestyle choices and visits with your health care provider that can promote health and wellness. What does preventive care include?  A yearly physical exam. This is also called an annual well check.  Dental exams once or twice a year.  Routine eye exams. Ask your health care provider how often you should have your eyes checked.  Personal lifestyle choices, including:  Daily care of your teeth and gums.  Regular physical activity.  Eating a healthy diet.  Avoiding tobacco and drug use.  Limiting alcohol use.  Practicing safe sex.  Taking low-dose aspirin every day.  Taking vitamin and mineral supplements as recommended by your health care provider. What happens during an annual well check? The services and screenings done by your health care  provider during your annual well check will depend on your age, overall health, lifestyle risk factors, and family history of disease. Counseling  Your health care provider may ask you questions about your:  Alcohol use.  Tobacco use.  Drug use.  Emotional well-being.  Home and relationship well-being.  Sexual activity.  Eating habits.  History of falls.  Memory and ability to understand (cognition).  Work and work Statistician.  Reproductive health. Screening  You may have the following tests or measurements:  Height, weight, and BMI.  Blood pressure.  Lipid and cholesterol levels. These may be checked every 5 years, or more frequently if you are over 51 years old.  Skin check.  Lung cancer screening. You may have this screening every year starting at age 74 if you have a 30-pack-year history of smoking and currently smoke or have quit within the past 15 years.  Fecal occult blood test (FOBT) of the stool. You may have this test every year starting at age 87.  Flexible sigmoidoscopy or colonoscopy. You may have a sigmoidoscopy every 5 years or a colonoscopy every 10 years starting at age 62.  Hepatitis C blood test.  Hepatitis B blood test.  Sexually transmitted disease (STD) testing.  Diabetes screening. This is done by checking your blood sugar (glucose) after you have not eaten for a while (fasting). You may have this done every 1-3 years.  Bone density scan. This is done to screen for osteoporosis. You may have this done starting at age 17.  Mammogram. This may be done every 1-2 years. Talk to your health care provider about how often you should have regular mammograms. Talk with your health care provider about your test results, treatment options,  and if necessary, the need for more tests. Vaccines  Your health care provider may recommend certain vaccines, such as:  Influenza vaccine. This is recommended every year.  Tetanus, diphtheria, and acellular  pertussis (Tdap, Td) vaccine. You may need a Td booster every 10 years.  Zoster vaccine. You may need this after age 2.  Pneumococcal 13-valent conjugate (PCV13) vaccine. One dose is recommended after age 58.  Pneumococcal polysaccharide (PPSV23) vaccine. One dose is recommended after age 45. Talk to your health care provider about which screenings and vaccines you need and how often you need them. This information is not intended to replace advice given to you by your health care provider. Make sure you discuss any questions you have with your health care provider. Document Released: 04/25/2015 Document Revised: 12/17/2015 Document Reviewed: 01/28/2015 Elsevier Interactive Patient Education  2017 [place] Prevention in the Home Falls can cause injuries. They can happen to people of all ages. There are many things you can do to make your home safe and to help prevent falls. What can I do on the outside of my home?  Regularly fix the edges of walkways and driveways and fix any cracks.  Remove anything that might make you trip as you walk through a door, such as a raised step or threshold.  Trim any bushes or trees on the path to your home.  Use bright outdoor lighting.  Clear any walking paths of anything that might make someone trip, such as rocks or tools.  Regularly check to see if handrails are loose or broken. Make sure that both sides of any steps have handrails.  Any raised decks and porches should have guardrails on the edges.  Have any leaves, snow, or ice cleared regularly.  Use sand or salt on walking paths during winter.  Clean up any spills in your garage right away. This includes oil or grease spills. What can I do in the bathroom?  Use night lights.  Install grab bars by the toilet and in the tub and shower. Do not use towel bars as grab bars.  Use non-skid mats or decals in the tub or shower.  If you need to sit down in the shower, use a plastic,  non-slip stool.  Keep the floor dry. Clean up any water that spills on the floor as soon as it happens.  Remove soap buildup in the tub or shower regularly.  Attach bath mats securely with double-sided non-slip rug tape.  Do not have throw rugs and other things on the floor that can make you trip. What can I do in the bedroom?  Use night lights.  Make sure that you have a light by your bed that is easy to reach.  Do not use any sheets or blankets that are too big for your bed. They should not hang down onto the floor.  Have a firm chair that has side arms. You can use this for support while you get dressed.  Do not have throw rugs and other things on the floor that can make you trip. What can I do in the kitchen?  Clean up any spills right away.  Avoid walking on wet floors.  Keep items that you use a lot in easy-to-reach places.  If you need to reach something above you, use a strong step stool that has a grab bar.  Keep electrical cords out of the way.  Do not use floor polish or wax that makes floors slippery. If  you must use wax, use non-skid floor wax.  Do not have throw rugs and other things on the floor that can make you trip. What can I do with my stairs?  Do not leave any items on the stairs.  Make sure that there are handrails on both sides of the stairs and use them. Fix handrails that are broken or loose. Make sure that handrails are as long as the stairways.  Check any carpeting to make sure that it is firmly attached to the stairs. Fix any carpet that is loose or worn.  Avoid having throw rugs at the top or bottom of the stairs. If you do have throw rugs, attach them to the floor with carpet tape.  Make sure that you have a light switch at the top of the stairs and the bottom of the stairs. If you do not have them, ask someone to add them for you. What else can I do to help prevent falls?  Wear shoes that:  Do not have high heels.  Have rubber  bottoms.  Are comfortable and fit you well.  Are closed at the toe. Do not wear sandals.  If you use a stepladder:  Make sure that it is fully opened. Do not climb a closed stepladder.  Make sure that both sides of the stepladder are locked into place.  Ask someone to hold it for you, if possible.  Clearly mark and make sure that you can see:  Any grab bars or handrails.  First and last steps.  Where the edge of each step is.  Use tools that help you move around (mobility aids) if they are needed. These include:  Canes.  Walkers.  Scooters.  Crutches.  Turn on the lights when you go into a dark area. Replace any light bulbs as soon as they burn out.  Set up your furniture so you have a clear path. Avoid moving your furniture around.  If any of your floors are uneven, fix them.  If there are any pets around you, be aware of where they are.  Review your medicines with your doctor. Some medicines can make you feel dizzy. This can increase your chance of falling. Ask your doctor what other things that you can do to help prevent falls. This information is not intended to replace advice given to you by your health care provider. Make sure you discuss any questions you have with your health care provider. Document Released: 01/23/2009 Document Revised: 09/04/2015 Document Reviewed: 05/03/2014 Elsevier Interactive Patient Education  2017 [employer].

## 2020-01-07 NOTE — Progress Notes (Signed)
Subjective:   Amanda Osborn is a 64 y.o. female who presents for an Initial Medicare Annual Wellness Visit.  Virtual Visit via Telephone Note  I connected with  Amanda Osborn on 01/07/20 at  2:40 PM EDT by telephone and verified that I am speaking with the correct person using two identifiers.  Medicare Annual Wellness visit completed telephonically due to Covid-19 pandemic.   Location: Patient: home Provider: [facility]   I discussed the limitations, risks, security and privacy concerns of performing an evaluation and management service by telephone and the availability of in person appointments. The patient expressed understanding and agreed to proceed.  Unable to perform video visit due to video visit attempted and failed and/or patient does not have video capability.   Some vital signs may be absent or patient reported.   [name], LPN    Review of Systems     Cardiac Risk Factors include: advanced age (>61men, >77 women);dyslipidemia     Objective:    There were no vitals filed for this visit. There is no height or weight on file to calculate BMI.  Advanced Directives 01/07/2020 09/21/2019 09/13/2019 12/28/2017 12/28/2017 06/28/2016 05/29/2015  Does Patient Have a Medical Advance Directive? Yes Yes No Yes No No Yes  Type of Paramedic of [place];Living will Living will - Living will - - Living will  Does patient want to make changes to medical advance directive? - - - No - Patient declined - - -  Copy of [place] in Chart? No - copy requested - - - - - -    Current Medications (verified) Outpatient Encounter Medications as of 01/07/2020  Medication Sig  . acyclovir (ZOVIRAX) 400 MG tablet Take 400 mg by mouth 2 (two) times daily as needed (fever blister).   Marland Kitchen aspirin EC 325 MG EC tablet Take 1 tablet (325 mg total) by mouth daily.  Marland Kitchen buPROPion (WELLBUTRIN SR) 100 MG 12 hr tablet Take 100 mg by mouth every morning.   .  Cholecalciferol (VITAMIN D-3) 125 MCG (5000 UT) TABS Take 5,000 Units by mouth daily.   . Multiple Vitamin (MULTIVITAMIN WITH MINERALS) TABS tablet Take 1 tablet by mouth daily. Alive Multivitamin  . rosuvastatin (CRESTOR) 20 MG tablet Take 1 tablet (20 mg total) by mouth at bedtime.  . sertraline (ZOLOFT) 25 MG tablet Take 1 tablet (25 mg total) by mouth every morning.  . traZODone (DESYREL) 50 MG tablet Take 1.5 tablets (75 mg total) by mouth at bedtime.  . [DISCONTINUED] Apoaequorin (PREVAGEN EXTRA STRENGTH PO) Take 1 tablet by mouth daily.   No facility-administered encounter medications on file as of 01/07/2020.    Allergies (verified) Montelukast sodium   History: Past Medical History:  Diagnosis Date  . Allergy   . Anxiety   . Arthritis   . Breast cancer ([place]) 2006   BIL BREASTS-LOBULAR CARCINOMA IN SITU  . Dizziness   . Headache   . History of CVA (cerebrovascular accident)   . Hyperlipidemia   . Osteoporosis   . Stroke ([place]) 2019   NO RESIDUAL EFFECTS   Past Surgical History:  Procedure Laterality Date  . AUGMENTATION MAMMAPLASTY Bilateral 2011  . BREAST CYST ASPIRATION Bilateral   . BREAST EXCISIONAL BIOPSY Left 2006   breast ca  . BREAST LUMPECTOMY    . CARDIAC CATHETERIZATION    . COLONOSCOPY    . COLONOSCOPY WITH PROPOFOL N/A 06/28/2016   Procedure: COLONOSCOPY WITH PROPOFOL;  Surgeon: Billie Ruddy  Gustavo Lah, MD;  Location: [facility] ENDOSCOPY;  Service: Endoscopy;  Laterality: N/A;  . CYSTOSCOPY W/ URETERAL STENT PLACEMENT Left 09/21/2019   Procedure: CYSTOSCOPY WITH RETROGRADE PYELOGRAM/URETERAL STENT PLACEMENT;  Surgeon: Billey Co, MD;  Location: [facility] ORS;  Service: Urology;  Laterality: Left;  . LYMPH NODE BIOPSY     NECK  . MASTECTOMY Bilateral   . TEE WITHOUT CARDIOVERSION N/A 12/30/2017   Procedure: TRANSESOPHAGEAL ECHOCARDIOGRAM (TEE);  Surgeon: Teodoro Spray, MD;  Location: [facility] ORS;  Service: Cardiovascular;  Laterality: N/A;  . URETEROSCOPY Left  09/21/2019   Procedure: URETEROSCOPY-DIAGNOSTIC;  Surgeon: Billey Co, MD;  Location: [facility] ORS;  Service: Urology;  Laterality: Left;  Marland Kitchen VAGINAL HYSTERECTOMY     Family History  Problem Relation Age of Onset  . Breast cancer Paternal Aunt   . Breast cancer Paternal Grandmother   . Dementia Mother        passed at age 66  . Leukemia Father        passed at age 82  . Leukemia Brother    Social History   Socioeconomic History  . Marital status: Married    Spouse name: Not on file  . Number of children: 2  . Years of education: college  . Highest education level: Bachelor's degree (e.g., BA, AB, BS)  Occupational History  . Occupation: retired from Data processing manager work  Tobacco Use  . Smoking status: Never Smoker  . Smokeless tobacco: Never Used  Vaping Use  . Vaping Use: Never used  Substance and Sexual Activity  . Alcohol use: No    Alcohol/week: 0.0 standard drinks  . Drug use: No  . Sexual activity: Yes  Other Topics Concern  . Not on file  Social History Narrative   Lives at home with her husband, Amanda Osborn.   Right-handed.   No caffeine use.   Social Determinants of Health   Financial Resource Strain: Low Risk   . Difficulty of Paying Living Expenses: Not hard at all  Food Insecurity: No Food Insecurity  . Worried About Charity fundraiser in the Last Year: Never true  . Ran Out of Food in the Last Year: Never true  Transportation Needs: No Transportation Needs  . Lack of Transportation (Medical): No  . Lack of Transportation (Non-Medical): No  Physical Activity: Inactive  . Days of Exercise per Week: 0 days  . Minutes of Exercise per Session: 0 min  Stress: No Stress Concern Present  . Feeling of Stress : Only a little  Social Connections: Moderately Integrated  . Frequency of Communication with Friends and Family: More than three times a week  . Frequency of Social Gatherings with Friends and Family: More than three times a week  . Attends Religious  Services: More than 4 times per year  . Active Member of Clubs or Organizations: No  . Attends Archivist Meetings: Never  . Marital Status: Married    Tobacco Counseling Counseling given: Not Answered   Clinical Intake:  Pre-visit preparation completed: Yes  Pain : No/denies pain     Nutritional Risks: None Diabetes: No  How often do you need to have someone help you when you read instructions, pamphlets, or other written materials from your doctor or pharmacy?: 1 - Never    Interpreter Needed?: No  Information entered by :: [name] LPN   Activities of Daily Living In your present state of health, do you have any difficulty performing the following activities: 01/07/2020 09/13/2019  Hearing? N  N  Comment declines hearing aids -  Vision? N N  Difficulty concentrating or making decisions? N N  Walking or climbing stairs? N N  Dressing or bathing? N N  Doing errands, shopping? N N  Preparing Food and eating ? N -  Using the Toilet? N -  In the past six months, have you accidently leaked urine? N -  Do you have problems with loss of bowel control? N -  Managing your Medications? N -  Managing your Finances? N -  Housekeeping or managing your Housekeeping? N -  Some recent data might be hidden    Patient Care Team: Juline Patch, MD as PCP - General (Family Medicine) Schermerhorn, Gwen Her, MD as Referring Physician (Obstetrics and Gynecology)  Indicate any recent Medical Services you may have received from other than [facility] providers in the past year (date may be approximate).     Assessment:   This is a routine wellness examination for [place].  Hearing/Vision screen  Hearing Screening   125Hz  250Hz  500Hz  1000Hz  2000Hz  3000Hz  4000Hz  6000Hz  8000Hz   Right ear:           Left ear:           Comments: Pt denies hearing difficulty  Vision Screening Comments: Annual vision screenings done by [facility]  Dietary issues and exercise  activities discussed: Current Exercise Habits: The patient does not participate in regular exercise at present, Exercise limited by: Other - see comments (recent kidney surgery)  Goals    . Increase physical activity     Recommend increasing physical activity to at least 3 days per week      Depression Screen PHQ 2/9 Scores 01/07/2020 09/19/2019 08/13/2019 05/22/2019 12/28/2018 05/17/2018 01/13/2018  PHQ - 2 Score 0 0 0 0 0 0 0  PHQ- 9 Score - 3 3 0 0 3 3    Fall Risk Fall Risk  01/07/2020 09/19/2019 08/13/2019 05/22/2019 05/17/2018  Falls in the past year? 0 0 0 0 0  Number falls in past yr: 0 - - - 0  Injury with Fall? 0 - - - 0  Risk for fall due to : No Fall Risks - - - -  Follow up Falls prevention discussed Falls evaluation completed Falls evaluation completed Falls evaluation completed -    Any stairs in or around the home? Yes  If so, are there any without handrails? No  Home free of loose throw rugs in walkways, pet beds, electrical cords, etc? Yes  Adequate lighting in your home to reduce risk of falls? Yes   ASSISTIVE DEVICES UTILIZED TO PREVENT FALLS:  Life alert? No  Use of a cane, walker or w/c? No  Grab bars in the bathroom? Yes  Shower chair or bench in shower? Yes  Elevated toilet seat or a handicapped toilet? Yes   TIMED UP AND GO:  Was the test performed? No . Telephonic visit.   Cognitive Function:     6CIT Screen 01/07/2020  What Year? 0 points  What month? 0 points  What time? 0 points  Count back from 20 0 points  Months in reverse 0 points  Repeat phrase 0 points  Total Score 0    Immunizations Immunization History  Administered Date(s) Administered  . Fluad Quad(high Dose 65+) 12/21/2018  . Influenza,inj,Quad PF,6+ Mos 12/29/2017  . Influenza-Unspecified 01/11/2016, 12/29/2017  . PFIZER SARS-COV-2 Vaccination 05/28/2019, 06/18/2019  . Pneumococcal Conjugate-13 12/28/2018    TDAP status: Due, Education has been  provided regarding the importance of  this vaccine. Advised may receive this vaccine at local pharmacy or Health Dept. Aware to provide a copy of the vaccination record if obtained from local pharmacy or Health Dept. Verbalized acceptance and understanding.   Flu Vaccine status: Declined, Education has been provided regarding the importance of this vaccine but patient still declined. Advised may receive this vaccine at local pharmacy or Health Dept. Aware to provide a copy of the vaccination record if obtained from local pharmacy or Health Dept. Verbalized acceptance and understanding.   Pneumococcal vaccine status: Declined,  Education has been provided regarding the importance of this vaccine but patient still declined. Advised may receive this vaccine at local pharmacy or Health Dept. Aware to provide a copy of the vaccination record if obtained from local pharmacy or Health Dept. Verbalized acceptance and understanding.    Covid-19 vaccine status: Completed vaccines  Qualifies for Shingles Vaccine? Yes   Zostavax completed No   Shingrix Completed?: No.    Education has been provided regarding the importance of this vaccine. Patient has been advised to call insurance company to determine out of pocket expense if they have not yet received this vaccine. Advised may also receive vaccine at local pharmacy or Health Dept. Verbalized acceptance and understanding.  Screening Tests Health Maintenance  Topic Date Due  . INFLUENZA VACCINE  11/11/2019  . PNA vac Low Risk Adult (2 of 2 - PPSV23) 12/28/2019  . DEXA SCAN  05/21/2020 (Originally 11/12/2018)  . TETANUS/TDAP  05/21/2020 (Originally 11/11/1974)  . Hepatitis C Screening  05/21/2020 (Originally 1955-08-08)  . MAMMOGRAM  03/18/2021  . COLONOSCOPY  06/29/2026  . COVID-19 Vaccine  Completed    Health Maintenance  Health Maintenance Due  Topic Date Due  . INFLUENZA VACCINE  11/11/2019  . PNA vac Low Risk Adult (2 of 2 - PPSV23) 12/28/2019    Colorectal cancer screening: Completed  06/28/16. Repeat every 10 years   Mammogram status: Completed 03/19/19. Repeat every year. Ordered by GYN.   Bone Density status: Ordered today. Pt provided with contact info and advised to call to schedule appt.  Lung Cancer Screening: (Low Dose CT Chest recommended if Age 28-80 years, 30 pack-year currently smoking OR have quit w/in 15years.) does not qualify.   Additional Screening:  Hepatitis C Screening: does qualify; postponed  Vision Screening: Recommended annual ophthalmology exams for early detection of glaucoma and other disorders of the eye. Is the patient up to date with their annual eye exam?  Yes  Who is the provider or what is the name of the office in which the patient attends annual eye exams? [place] Screening: Recommended annual dental exams for proper oral hygiene  Community Resource Referral / Chronic Care Management: CRR required this visit?  No   CCM required this visit?  No      Plan:     I have personally reviewed and noted the following in the patient's chart:   . Medical and social history . Use of alcohol, tobacco or illicit drugs  . Current medications and supplements . Functional ability and status . Nutritional status . Physical activity . Advanced directives . List of other physicians . Hospitalizations, surgeries, and ER visits in previous 12 months . Vitals . Screenings to include cognitive, depression, and falls . Referrals and appointments  In addition, I have reviewed and discussed with patient certain preventive protocols, quality metrics, and best practice recommendations. A written personalized care plan for preventive services  as well as general preventive health recommendations were provided to patient.     [name], LPN   [id number]   Nurse Notes: none

## 2020-03-04 ENCOUNTER — Other Ambulatory Visit: Payer: Self-pay | Admitting: Obstetrics and Gynecology

## 2020-03-04 DIAGNOSIS — Z1239 Encounter for other screening for malignant neoplasm of breast: Secondary | ICD-10-CM

## 2020-03-21 ENCOUNTER — Ambulatory Visit
Admission: RE | Admit: 2020-03-21 | Discharge: 2020-03-21 | Disposition: A | Discharge status: Home / Self Care | Payer: Medicare Other | Source: Ambulatory Visit | Attending: Obstetrics and Gynecology | Admitting: Obstetrics and Gynecology

## 2020-03-21 ENCOUNTER — Other Ambulatory Visit: Payer: Self-pay

## 2020-03-21 DIAGNOSIS — Z1239 Encounter for other screening for malignant neoplasm of breast: Secondary | ICD-10-CM | POA: Diagnosis present

## 2020-03-21 MED ORDER — GADOBUTROL 1 MMOL/ML IV SOLN
5.0000 mL | Freq: Once | INTRAVENOUS | Status: AC | PRN
  Administered 2020-03-21: 5 mL via INTRAVENOUS

## 2020-05-14 DIAGNOSIS — F411 Generalized anxiety disorder: Secondary | ICD-10-CM | POA: Diagnosis not present

## 2020-05-14 DIAGNOSIS — F5105 Insomnia due to other mental disorder: Secondary | ICD-10-CM | POA: Diagnosis not present

## 2020-07-22 ENCOUNTER — Ambulatory Visit (INDEPENDENT_AMBULATORY_CARE_PROVIDER_SITE_OTHER): Payer: Medicare Other | Admitting: Family Medicine

## 2020-07-22 ENCOUNTER — Encounter: Payer: Self-pay | Admitting: Family Medicine

## 2020-07-22 ENCOUNTER — Other Ambulatory Visit: Payer: Self-pay

## 2020-07-22 VITALS — BP 110/80 | HR 68 | Temp 98.9°F | Ht 63.0 in | Wt 104.0 lb

## 2020-07-22 DIAGNOSIS — R059 Cough, unspecified: Secondary | ICD-10-CM

## 2020-07-22 DIAGNOSIS — J0100 Acute maxillary sinusitis, unspecified: Secondary | ICD-10-CM

## 2020-07-22 MED ORDER — AZITHROMYCIN 250 MG PO TABS: ORAL_TABLET | ORAL | 0 refills | Status: DC

## 2020-07-22 MED ORDER — GUAIFENESIN-CODEINE 100-10 MG/5ML PO SYRP: 5.0000 mL | ORAL_SOLUTION | Freq: Four times a day (QID) | ORAL | 0 refills | Status: DC | PRN

## 2020-07-22 NOTE — Progress Notes (Signed)
Date:  07/22/2020   Name:  Amanda Osborn   DOB:  [birth year]   MRN:  [id number]   Chief Complaint: Sinusitis (Cough, sore throat, nasal congestion- no production)  Sinusitis This is a new problem. The current episode started in the past 7 days. The problem has been gradually worsening since onset. Maximum temperature: low grade. Her pain is at a severity of 5/10. The pain is moderate. Associated symptoms include congestion, coughing, headaches, sinus pressure and a sore throat. Pertinent negatives include no chills, diaphoresis, ear pain, hoarse voice, shortness of breath, sneezing or swollen glands. Past treatments include oral decongestants (mucinex). The treatment provided moderate relief.    Lab Results  Component Value Date   CREATININE 0.74 09/13/2019   BUN 21 09/13/2019   NA 139 09/13/2019   K 4.4 09/13/2019   CL 103 09/13/2019   CO2 28 09/13/2019   Lab Results  Component Value Date   CHOL 171 09/19/2019   HDL 74 09/19/2019   LDLCALC 86 09/19/2019   TRIG 54 09/19/2019   CHOLHDL 2.1 05/17/2018   Lab Results  Component Value Date   TSH 1.430 05/17/2018   Lab Results  Component Value Date   HGBA1C 5.3 12/29/2017   Lab Results  Component Value Date   WBC 4.8 09/13/2019   HGB 12.4 09/13/2019   HCT 36.4 09/13/2019   MCV 95.3 09/13/2019   PLT 175 09/13/2019   Lab Results  Component Value Date   ALT 32 12/28/2017   AST 28 12/28/2017   ALKPHOS 34 (L) 12/28/2017   BILITOT 0.3 12/28/2017     Review of Systems  Constitutional: Negative for chills and diaphoresis.  HENT: Positive for congestion, sinus pressure and sore throat. Negative for ear pain, hoarse voice and sneezing.   Respiratory: Positive for cough. Negative for shortness of breath.   Neurological: Positive for headaches.    Patient Active Problem List   Diagnosis Date Noted  . Pelvic pain in female 08/30/2019  . Dizziness 03/21/2019  . CVA (cerebral vascular accident) ([place]) 12/29/2017  .  Slurred speech 12/28/2017  . Malignant neoplastic disease ([place]) 03/18/2015  . Chondromalacia of patella 09/13/2014  . Bursitis 09/13/2014  . Allergic rhinitis 09/13/2014  . Cancer ([place]) 09/13/2014  . Cervical disc disease 09/13/2014    Allergies  Allergen Reactions  . Montelukast Sodium     Caused headaches    Past Surgical History:  Procedure Laterality Date  . AUGMENTATION MAMMAPLASTY Bilateral 2011  . BREAST CYST ASPIRATION Bilateral   . BREAST EXCISIONAL BIOPSY Left 2006   breast ca  . BREAST LUMPECTOMY    . CARDIAC CATHETERIZATION    . COLONOSCOPY    . COLONOSCOPY WITH PROPOFOL N/A 06/28/2016   Procedure: COLONOSCOPY WITH PROPOFOL;  Surgeon: Lollie Sails, MD;  Location: [facility] ENDOSCOPY;  Service: Endoscopy;  Laterality: N/A;  . CYSTOSCOPY W/ URETERAL STENT PLACEMENT Left 09/21/2019   Procedure: CYSTOSCOPY WITH RETROGRADE PYELOGRAM/URETERAL STENT PLACEMENT;  Surgeon: Billey Co, MD;  Location: [facility] ORS;  Service: Urology;  Laterality: Left;  . LYMPH NODE BIOPSY     NECK  . MASTECTOMY Bilateral   . TEE WITHOUT CARDIOVERSION N/A 12/30/2017   Procedure: TRANSESOPHAGEAL ECHOCARDIOGRAM (TEE);  Surgeon: Teodoro Spray, MD;  Location: [facility] ORS;  Service: Cardiovascular;  Laterality: N/A;  . URETEROSCOPY Left 09/21/2019   Procedure: URETEROSCOPY-DIAGNOSTIC;  Surgeon: Billey Co, MD;  Location: [facility] ORS;  Service: Urology;  Laterality: Left;  Marland Kitchen VAGINAL HYSTERECTOMY  Social History   Tobacco Use  . Smoking status: Never Smoker  . Smokeless tobacco: Never Used  Vaping Use  . Vaping Use: Never used  Substance Use Topics  . Alcohol use: No    Alcohol/week: 0.0 standard drinks  . Drug use: No     Medication list has been reviewed and updated.  Current Meds  Medication Sig  . acyclovir (ZOVIRAX) 400 MG tablet Take 400 mg by mouth 2 (two) times daily as needed (fever blister).   Marland Kitchen aspirin EC 325 MG EC tablet Take 1 tablet (325 mg total) by mouth daily.   Marland Kitchen buPROPion (WELLBUTRIN SR) 100 MG 12 hr tablet Take 100 mg by mouth every morning.   . Cholecalciferol (VITAMIN D-3) 125 MCG (5000 UT) TABS Take 5,000 Units by mouth daily.   . Multiple Vitamin (MULTIVITAMIN WITH MINERALS) TABS tablet Take 1 tablet by mouth daily. Alive Multivitamin  . rosuvastatin (CRESTOR) 20 MG tablet Take 1 tablet (20 mg total) by mouth at bedtime.  . sertraline (ZOLOFT) 25 MG tablet Take 1 tablet (25 mg total) by mouth every morning.  . traZODone (DESYREL) 50 MG tablet Take 1.5 tablets (75 mg total) by mouth at bedtime.    PHQ 2/9 Scores 07/22/2020 01/07/2020 09/19/2019 08/13/2019  PHQ - 2 Score 0 0 0 0  PHQ- 9 Score 0 - 3 3    GAD 7 : Generalized Anxiety Score 07/22/2020 09/19/2019 08/13/2019 05/22/2019  Nervous, Anxious, on Edge 0 1 0 0  Control/stop worrying 0 1 0 0  Worry too much - different things 0 1 0 0  Trouble relaxing 0 0 0 0  Restless 0 0 0 0  Easily annoyed or irritable 0 0 1 0  Afraid - awful might happen 0 2 0 0  Total GAD 7 Score 0 5 1 0  Anxiety Difficulty - Not difficult at all - -    BP Readings from Last 3 Encounters:  07/22/20 110/80  11/28/19 102/69  09/26/19 115/71    Physical Exam Vitals and nursing note reviewed.  Constitutional:      Appearance: She is well-developed.  HENT:     Head: Normocephalic.     Right Ear: Tympanic membrane, ear canal and external ear normal. There is no impacted cerumen.     Left Ear: Tympanic membrane, ear canal and external ear normal. There is no impacted cerumen.     Nose: Nose normal.     Right Sinus: No maxillary sinus tenderness or frontal sinus tenderness.     Left Sinus: No maxillary sinus tenderness or frontal sinus tenderness.     Mouth/Throat:     Pharynx: Posterior oropharyngeal erythema present. No oropharyngeal exudate.  Eyes:     General: Lids are everted, no foreign bodies appreciated. No scleral icterus.       Left eye: No foreign body or hordeolum.     Conjunctiva/sclera: Conjunctivae  normal.     Right eye: Right conjunctiva is not injected.     Left eye: Left conjunctiva is not injected.     Pupils: Pupils are equal, round, and reactive to light.  Neck:     Thyroid: No thyromegaly.     Vascular: No JVD.     Trachea: No tracheal deviation.  Cardiovascular:     Rate and Rhythm: Normal rate and regular rhythm.     Heart sounds: Normal heart sounds. No murmur heard. No friction rub. No gallop.   Pulmonary:     Effort: Pulmonary effort  is normal. No respiratory distress.     Breath sounds: Normal breath sounds. No wheezing, rhonchi or rales.  Abdominal:     General: Bowel sounds are normal.     Palpations: Abdomen is soft. There is no mass.     Tenderness: There is no abdominal tenderness. There is no guarding or rebound.  Musculoskeletal:        General: No tenderness. Normal range of motion.     Cervical back: Normal range of motion and neck supple.  Lymphadenopathy:     Head:     Right side of head: No submental, submandibular or tonsillar adenopathy.     Left side of head: No submental, submandibular or tonsillar adenopathy.     Cervical: No cervical adenopathy.  Skin:    General: Skin is warm.     Findings: No rash.  Neurological:     Mental Status: She is alert and oriented to person, place, and time.     Cranial Nerves: No cranial nerve deficit.     Deep Tendon Reflexes: Reflexes normal.  Psychiatric:        Mood and Affect: Mood is not anxious or depressed.     Wt Readings from Last 3 Encounters:  07/22/20 104 lb (47.2 kg)  11/28/19 101 lb (45.8 kg)  09/26/19 100 lb (45.4 kg)    BP 110/80   Pulse 68   Temp 98.9 F (37.2 C) (Oral)   Ht 5\' 3"  (1.6 m)   Wt 104 lb (47.2 kg)   SpO2 98%   BMI 18.42 kg/m   Assessment and Plan: 1. Acute maxillary sinusitis, recurrence not specified Acute.  Persistent.  Stable.  Continue with over-the-counter preparations.  In addition we will prescribe azithromycin to 50 mg 2 today followed by 1 a day for 4  days. - azithromycin (ZITHROMAX) 250 MG tablet; 2 today then 1 a day for 4 days  Dispense: 6 tablet; Refill: 0  2. Cough New onset.  Persistent.  Patient has cough particularly at night.  We will prescribe Robitussin-AC for cough treatment. - guaiFENesin-codeine (ROBITUSSIN AC) 100-10 MG/5ML syrup; Take 5 mLs by mouth 4 (four) times daily as needed for cough.  Dispense: 118 mL; Refill: 0

## 2020-08-08 ENCOUNTER — Encounter: Payer: Self-pay | Admitting: Family Medicine

## 2020-08-08 ENCOUNTER — Other Ambulatory Visit: Payer: Self-pay

## 2020-08-08 ENCOUNTER — Ambulatory Visit (INDEPENDENT_AMBULATORY_CARE_PROVIDER_SITE_OTHER): Payer: Medicare Other | Admitting: Family Medicine

## 2020-08-08 VITALS — BP 100/60 | HR 80 | Ht 63.0 in | Wt 104.0 lb

## 2020-08-08 DIAGNOSIS — Z23 Encounter for immunization: Secondary | ICD-10-CM

## 2020-08-08 DIAGNOSIS — F5101 Primary insomnia: Secondary | ICD-10-CM | POA: Diagnosis not present

## 2020-08-08 DIAGNOSIS — F324 Major depressive disorder, single episode, in partial remission: Secondary | ICD-10-CM

## 2020-08-08 DIAGNOSIS — E782 Mixed hyperlipidemia: Secondary | ICD-10-CM

## 2020-08-08 MED ORDER — ROSUVASTATIN CALCIUM 20 MG PO TABS: 20.0000 mg | ORAL_TABLET | Freq: Every day | ORAL | 1 refills | Status: DC

## 2020-08-08 MED ORDER — TRAZODONE HCL 50 MG PO TABS: 75.0000 mg | ORAL_TABLET | Freq: Every day | ORAL | 1 refills | Status: DC

## 2020-08-08 MED ORDER — SERTRALINE HCL 25 MG PO TABS: 25.0000 mg | ORAL_TABLET | ORAL | 1 refills | Status: DC

## 2020-08-08 NOTE — Progress Notes (Signed)
Date:  08/08/2020   Name:  Amanda Osborn   DOB:  1956-03-25   MRN:  [id number]   Chief Complaint: Depression, Hyperlipidemia, and Insomnia  Depression        This is a chronic problem.  The current episode started more than 1 year ago.   The problem occurs intermittently.  The problem has been gradually improving since onset.  Associated symptoms include insomnia.  Associated symptoms include no decreased concentration, no fatigue, no helplessness, no hopelessness, not irritable, no restlessness, no decreased interest, no appetite change, no body aches, no myalgias, no headaches, no indigestion, not sad and no suicidal ideas.  Past treatments include SSRIs - Selective serotonin reuptake inhibitors.  Compliance with treatment is good.  Previous treatment provided mild relief.   Pertinent negatives include no hypothyroidism. Hyperlipidemia This is a chronic problem. The problem is controlled. Recent lipid tests were reviewed and are normal. She has no history of chronic renal disease, diabetes or hypothyroidism. Pertinent negatives include no chest pain, focal sensory loss, focal weakness, leg pain, myalgias or shortness of breath. Current antihyperlipidemic treatment includes statins. The current treatment provides moderate improvement of lipids. There are no compliance problems.  Risk factors for coronary artery disease include dyslipidemia and hypertension.  Insomnia Primary symptoms: no fragmented sleep, no sleep disturbance, no difficulty falling asleep, no somnolence, no frequent awakening, no premature morning awakening, no malaise/fatigue, no napping.   The current episode started more than one year. The problem occurs intermittently. The problem has been waxing and waning since onset. The treatment provided mild relief. PMH includes: depression.    Lab Results  Component Value Date   CREATININE 0.74 09/13/2019   BUN 21 09/13/2019   NA 139 09/13/2019   K 4.4 09/13/2019   CL 103  09/13/2019   CO2 28 09/13/2019   Lab Results  Component Value Date   CHOL 171 09/19/2019   HDL 74 09/19/2019   LDLCALC 86 09/19/2019   TRIG 54 09/19/2019   CHOLHDL 2.1 05/17/2018   Lab Results  Component Value Date   TSH 1.430 05/17/2018   Lab Results  Component Value Date   HGBA1C 5.3 12/29/2017   Lab Results  Component Value Date   WBC 4.8 09/13/2019   HGB 12.4 09/13/2019   HCT 36.4 09/13/2019   MCV 95.3 09/13/2019   PLT 175 09/13/2019   Lab Results  Component Value Date   ALT 32 12/28/2017   AST 28 12/28/2017   ALKPHOS 34 (L) 12/28/2017   BILITOT 0.3 12/28/2017     Review of Systems  Constitutional: Negative.  Negative for appetite change, chills, fatigue, fever, malaise/fatigue and unexpected weight change.  HENT: Negative for congestion, ear discharge, ear pain, rhinorrhea, sinus pressure, sneezing and sore throat.   Eyes: Negative for photophobia, pain, discharge, redness and itching.  Respiratory: Negative for cough, shortness of breath, wheezing and stridor.   Cardiovascular: Negative for chest pain, palpitations and leg swelling.  Gastrointestinal: Negative for abdominal pain, blood in stool, constipation, diarrhea, nausea and vomiting.  Endocrine: Negative for cold intolerance, heat intolerance, polydipsia, polyphagia and polyuria.  Genitourinary: Negative for dysuria, flank pain, frequency, hematuria, menstrual problem, pelvic pain, urgency, vaginal bleeding and vaginal discharge.  Musculoskeletal: Negative for arthralgias, back pain and myalgias.  Skin: Negative for pallor and rash.  Allergic/Immunologic: Negative for environmental allergies and food allergies.  Neurological: Negative for dizziness, focal weakness, weakness, light-headedness, numbness and headaches.  Hematological: Negative for adenopathy. Does not bruise/bleed easily.  Psychiatric/Behavioral: Positive for depression. Negative for decreased concentration, dysphoric mood, sleep disturbance  and suicidal ideas. The patient has insomnia. The patient is not nervous/anxious.     Patient Active Problem List   Diagnosis Date Noted  . Pelvic pain in female 08/30/2019  . Dizziness 03/21/2019  . CVA (cerebral vascular accident) ([place]) 12/29/2017  . Slurred speech 12/28/2017  . Malignant neoplastic disease ([place]) 03/18/2015  . Chondromalacia of patella 09/13/2014  . Bursitis 09/13/2014  . Allergic rhinitis 09/13/2014  . Cancer ([place]) 09/13/2014  . Cervical disc disease 09/13/2014    Allergies  Allergen Reactions  . Montelukast Sodium     Caused headaches    Past Surgical History:  Procedure Laterality Date  . AUGMENTATION MAMMAPLASTY Bilateral 2011  . BREAST CYST ASPIRATION Bilateral   . BREAST EXCISIONAL BIOPSY Left 2006   breast ca  . BREAST LUMPECTOMY    . CARDIAC CATHETERIZATION    . COLONOSCOPY    . COLONOSCOPY WITH PROPOFOL N/A 06/28/2016   Procedure: COLONOSCOPY WITH PROPOFOL;  Surgeon: Lollie Sails, MD;  Location: [facility] ENDOSCOPY;  Service: Endoscopy;  Laterality: N/A;  . CYSTOSCOPY W/ URETERAL STENT PLACEMENT Left 09/21/2019   Procedure: CYSTOSCOPY WITH RETROGRADE PYELOGRAM/URETERAL STENT PLACEMENT;  Surgeon: Billey Co, MD;  Location: [facility] ORS;  Service: Urology;  Laterality: Left;  . LYMPH NODE BIOPSY     NECK  . MASTECTOMY Bilateral   . TEE WITHOUT CARDIOVERSION N/A 12/30/2017   Procedure: TRANSESOPHAGEAL ECHOCARDIOGRAM (TEE);  Surgeon: Teodoro Spray, MD;  Location: [facility] ORS;  Service: Cardiovascular;  Laterality: N/A;  . URETEROSCOPY Left 09/21/2019   Procedure: URETEROSCOPY-DIAGNOSTIC;  Surgeon: Billey Co, MD;  Location: [facility] ORS;  Service: Urology;  Laterality: Left;  Marland Kitchen VAGINAL HYSTERECTOMY      Social History   Tobacco Use  . Smoking status: Never Smoker  . Smokeless tobacco: Never Used  Vaping Use  . Vaping Use: Never used  Substance Use Topics  . Alcohol use: No    Alcohol/week: 0.0 standard drinks  . Drug use: No      Medication list has been reviewed and updated.  Current Meds  Medication Sig  . acyclovir (ZOVIRAX) 400 MG tablet Take 400 mg by mouth 2 (two) times daily as needed (fever blister).   Marland Kitchen aspirin EC 325 MG EC tablet Take 1 tablet (325 mg total) by mouth daily.  Marland Kitchen buPROPion (WELLBUTRIN SR) 100 MG 12 hr tablet Take 100 mg by mouth every morning.   . Cholecalciferol (VITAMIN D-3) 125 MCG (5000 UT) TABS Take 5,000 Units by mouth daily.   . Multiple Vitamin (MULTIVITAMIN WITH MINERALS) TABS tablet Take 1 tablet by mouth daily. Alive Multivitamin  . rosuvastatin (CRESTOR) 20 MG tablet Take 1 tablet (20 mg total) by mouth at bedtime.  . sertraline (ZOLOFT) 25 MG tablet Take 1 tablet (25 mg total) by mouth every morning.  . traZODone (DESYREL) 50 MG tablet Take 1.5 tablets (75 mg total) by mouth at bedtime.    PHQ 2/9 Scores 08/08/2020 07/22/2020 01/07/2020 09/19/2019  PHQ - 2 Score 0 0 0 0  PHQ- 9 Score 0 0 - 3    GAD 7 : Generalized Anxiety Score 08/08/2020 07/22/2020 09/19/2019 08/13/2019  Nervous, Anxious, on Edge 0 0 1 0  Control/stop worrying 0 0 1 0  Worry too much - different things 0 0 1 0  Trouble relaxing 0 0 0 0  Restless 0 0 0 0  Easily annoyed or irritable 0 0 0 1  Afraid - awful might happen 0 0 2 0  Total GAD 7 Score 0 0 5 1  Anxiety Difficulty - - Not difficult at all -    BP Readings from Last 3 Encounters:  08/08/20 100/60  07/22/20 110/80  11/28/19 102/69    Physical Exam Vitals and nursing note reviewed.  Constitutional:      General: She is not irritable.    Appearance: She is well-developed.  HENT:     Head: Normocephalic.     Right Ear: Tympanic membrane and external ear normal.     Left Ear: Tympanic membrane, ear canal and external ear normal.     Nose: Nose normal. No congestion or rhinorrhea.     Mouth/Throat:     Mouth: Mucous membranes are moist.  Eyes:     General: Lids are everted, no foreign bodies appreciated. No scleral icterus.       Left eye:  No foreign body or hordeolum.     Conjunctiva/sclera: Conjunctivae normal.     Right eye: Right conjunctiva is not injected.     Left eye: Left conjunctiva is not injected.     Pupils: Pupils are equal, round, and reactive to light.  Neck:     Thyroid: No thyromegaly.     Vascular: No JVD.     Trachea: No tracheal deviation.  Cardiovascular:     Rate and Rhythm: Normal rate and regular rhythm.     Pulses: Normal pulses.     Heart sounds: Normal heart sounds, S1 normal and S2 normal. No murmur heard. No friction rub. No gallop. No S3 or S4 sounds.   Pulmonary:     Effort: Pulmonary effort is normal. No respiratory distress.     Breath sounds: Normal breath sounds. No wheezing, rhonchi or rales.  Chest:     Chest wall: No tenderness.  Abdominal:     General: Bowel sounds are normal.     Palpations: Abdomen is soft. There is no mass.     Tenderness: There is no abdominal tenderness. There is no guarding or rebound.  Musculoskeletal:        General: No tenderness. Normal range of motion.     Cervical back: Normal range of motion and neck supple.     Right lower leg: No edema.     Left lower leg: No edema.  Lymphadenopathy:     Cervical: No cervical adenopathy.  Skin:    General: Skin is warm.     Findings: No bruising, erythema or rash.  Neurological:     Mental Status: She is alert and oriented to person, place, and time.     Cranial Nerves: No cranial nerve deficit.     Deep Tendon Reflexes: Reflexes normal.  Psychiatric:        Mood and Affect: Mood is not anxious or depressed.     Wt Readings from Last 3 Encounters:  08/08/20 104 lb (47.2 kg)  07/22/20 104 lb (47.2 kg)  11/28/19 101 lb (45.8 kg)    BP 100/60   Pulse 80   Ht 5\' 3"  (1.6 m)   Wt 104 lb (47.2 kg)   BMI 18.42 kg/m   Assessment and Plan: 1. Mixed hyperlipidemia Chronic.  Controlled.  Stable.  Continue rosuvastatin 20 mg once a day.  Will check lipid panel and comprehensive metabolic panel for  current status. - rosuvastatin (CRESTOR) 20 MG tablet; Take 1 tablet (20 mg total) by mouth at bedtime.  Dispense: 90 tablet; Refill: 1 -  Lipid Panel With LDL/HDL Ratio - Comprehensive Metabolic Panel (CMET)  2. Major depressive disorder in partial remission, unspecified whether recurrent (HCC) Chronic.  Controlled.  Stable.  PHQ is 1 Gad score is 0 continue sertraline 25 mg once a day. - sertraline (ZOLOFT) 25 MG tablet; Take 1 tablet (25 mg total) by mouth every morning.  Dispense: 90 tablet; Refill: 1 - traZODone (DESYREL) 50 MG tablet; Take 1.5 tablets (75 mg total) by mouth at bedtime.  Dispense: 135 tablet; Refill: 1  3. Primary insomnia Chronic.  Controlled.  Stable.  Continue trazodone 50 mg 1/2 to 1 tablet nightly. 4. Need for pneumococcal vaccination Discussion and administration. - Pneumococcal conjugate vaccine 20-valent (Prevnar 20)

## 2020-08-09 LAB — COMPREHENSIVE METABOLIC PANEL
ALT: 30 IU/L (ref 0–32)
AST: 31 IU/L (ref 0–40)
Albumin/Globulin Ratio: 3.4 — ABNORMAL HIGH (ref 1.2–2.2)
Albumin: 4.8 g/dL (ref 3.8–4.8)
Alkaline Phosphatase: 48 IU/L (ref 44–121)
BUN/Creatinine Ratio: 29 — ABNORMAL HIGH (ref 12–28)
BUN: 26 mg/dL (ref 8–27)
Bilirubin Total: 0.4 mg/dL (ref 0.0–1.2)
CO2: 27 mmol/L (ref 20–29)
Calcium: 9.7 mg/dL (ref 8.7–10.3)
Chloride: 104 mmol/L (ref 96–106)
Creatinine, Ser: 0.91 mg/dL (ref 0.57–1.00)
Globulin, Total: 1.4 g/dL — ABNORMAL LOW (ref 1.5–4.5)
Glucose: 94 mg/dL (ref 65–99)
Potassium: 4.4 mmol/L (ref 3.5–5.2)
Sodium: 143 mmol/L (ref 134–144)
Total Protein: 6.2 g/dL (ref 6.0–8.5)
eGFR: 70 mL/min/{1.73_m2} (ref >59)

## 2020-08-09 LAB — LIPID PANEL WITH LDL/HDL RATIO
Cholesterol, Total: 141 mg/dL (ref 100–199)
HDL: 69 mg/dL (ref >39)
LDL Chol Calc (NIH): 60 mg/dL (ref 0–99)
LDL/HDL Ratio: 0.9 ratio (ref 0.0–3.2)
Triglycerides: 57 mg/dL (ref 0–149)
VLDL Cholesterol Cal: 12 mg/dL (ref 5–40)

## 2020-08-22 ENCOUNTER — Other Ambulatory Visit: Payer: Self-pay | Admitting: Family Medicine

## 2020-08-22 DIAGNOSIS — R059 Cough, unspecified: Secondary | ICD-10-CM

## 2020-09-01 ENCOUNTER — Ambulatory Visit (INDEPENDENT_AMBULATORY_CARE_PROVIDER_SITE_OTHER): Payer: Medicare Other | Admitting: Urology

## 2020-09-01 ENCOUNTER — Encounter: Payer: Self-pay | Admitting: Urology

## 2020-09-01 ENCOUNTER — Other Ambulatory Visit: Payer: Self-pay

## 2020-09-01 VITALS — BP 120/75 | HR 75 | Ht 63.0 in | Wt 102.0 lb

## 2020-09-01 DIAGNOSIS — Z87442 Personal history of urinary calculi: Secondary | ICD-10-CM | POA: Diagnosis not present

## 2020-09-01 DIAGNOSIS — N135 Crossing vessel and stricture of ureter without hydronephrosis: Secondary | ICD-10-CM | POA: Diagnosis not present

## 2020-09-01 NOTE — Patient Instructions (Signed)
Acute Back Pain, Adult Acute back pain is sudden and usually short-lived. It is often caused by an injury to the muscles and tissues in the back. The injury may result from:  A muscle or ligament getting overstretched or torn (strained). Ligaments are tissues that connect bones to each other. Lifting something improperly can cause a back strain.  Wear and tear (degeneration) of the spinal disks. Spinal disks are circular tissue that provide cushioning between the bones of the spine (vertebrae).  Twisting motions, such as while playing sports or doing yard work.  A hit to the back.  Arthritis. You may have a physical exam, lab tests, and imaging tests to find the cause of your pain. Acute back pain usually goes away with rest and home care. Follow these instructions at home: Managing pain, stiffness, and swelling  Treatment may include medicines for pain and inflammation that are taken by mouth or applied to the skin, prescription pain medicine, or muscle relaxants. Take over-the-counter and prescription medicines only as told by your health care provider.  Your health care provider may recommend applying ice during the first 24-48 hours after your pain starts. To do this: ? Put ice in a plastic bag. ? Place a towel between your skin and the bag. ? Leave the ice on for 20 minutes, 2-3 times a day.  If directed, apply heat to the affected area as often as told by your health care provider. Use the heat source that your health care provider recommends, such as a moist heat pack or a heating pad. ? Place a towel between your skin and the heat source. ? Leave the heat on for 20-30 minutes. ? Remove the heat if your skin turns bright red. This is especially important if you are unable to feel pain, heat, or cold. You have a greater risk of getting burned. Activity  Do not stay in bed. Staying in bed for more than 1-2 days can delay your recovery.  Sit up and stand up straight. Avoid leaning  forward when you sit or hunching over when you stand. ? If you work at a desk, sit close to it so you do not need to lean over. Keep your chin tucked in. Keep your neck drawn back, and keep your elbows bent at a 90-degree angle (right angle). ? Sit high and close to the steering wheel when you drive. Add lower back (lumbar) support to your car seat, if needed.  Take short walks on even surfaces as soon as you are able. Try to increase the length of time you walk each day.  Do not sit, drive, or stand in one place for more than 30 minutes at a time. Sitting or standing for long periods of time can put stress on your back.  Do not drive or use heavy machinery while taking prescription pain medicine.  Use proper lifting techniques. When you bend and lift, use positions that put less stress on your back: ? Bend your knees. ? Keep the load close to your body. ? Avoid twisting.  Exercise regularly as told by your health care provider. Exercising helps your back heal faster and helps prevent back injuries by keeping muscles strong and flexible.  Work with a physical therapist to make a safe exercise program, as recommended by your health care provider. Do any exercises as told by your physical therapist.   Lifestyle  Maintain a healthy weight. Extra weight puts stress on your back and makes it difficult to have   good posture.  Avoid activities or situations that make you feel anxious or stressed. Stress and anxiety increase muscle tension and can make back pain worse. Learn ways to manage anxiety and stress, such as through exercise. General instructions  Sleep on a firm mattress in a comfortable position. Try lying on your side with your knees slightly bent. If you lie on your back, put a pillow under your knees.  Follow your treatment plan as told by your health care provider. This may include: ? Cognitive or behavioral therapy. ? Acupuncture or massage therapy. ? Meditation or yoga. Contact  a health care provider if:  You have pain that is not relieved with rest or medicine.  You have increasing pain going down into your legs or buttocks.  Your pain does not improve after 2 weeks.  You have pain at night.  You lose weight without trying.  You have a fever or chills. Get help right away if:  You develop new bowel or bladder control problems.  You have unusual weakness or numbness in your arms or legs.  You develop nausea or vomiting.  You develop abdominal pain.  You feel faint. Summary  Acute back pain is sudden and usually short-lived.  Use proper lifting techniques. When you bend and lift, use positions that put less stress on your back.  Take over-the-counter and prescription medicines and apply heat or ice as directed by your health care provider. This information is not intended to replace advice given to you by your health care provider. Make sure you discuss any questions you have with your health care provider. Document Revised: 12/21/2019 Document Reviewed: 12/21/2019 Elsevier Patient Education  2021 [employer]  

## 2020-09-01 NOTE — Progress Notes (Signed)
   09/01/2020 1:57 PM   Amanda Osborn 1955-11-19 [id number]  Reason for visit: Flank pain  HPI:  Briefly she is a healthy 65 year old female with no smoking history or other risk factors who presented with gross hematuria in May 2021.  CT urogram suggested a possible left UPJ obstruction of unclear etiology and subtle left hydronephrosis.  There were no other abnormalities.  There is no prior imaging to review duration.  On 09/21/2019 she underwent a normal cystoscopy, and a left retrograde pyelogram in the operating room that showed no filling defects or significant hydronephrosis on the left side.  I was unable to pass the flexible ureteroscope up to the left kidney secondary to narrowing of the distal ureter, and a stent was placed.  She had significant bothersome stent related symptoms, and deferred a second look ureteroscopy for definitive diagnosis of her left UPJ abnormality.  Cytology was negative, and with her absence of risk factors and lack of symptoms, she was comfortable with close observation with follow-up with a repeat ultrasound.   Repeat renal ultrasound on 11/26/2019 showed mild left hydronephrosis compatible with probable UPJ obstruction on prior CT, and some minimally complex left cyst.  Her renal function is normal.  She reports 2 to 3 weeks of new left back pain and groin pain.  This does correlate with significant increase in physical activity with mulching and working in the yard.  She is wondering if this could be related to her kidney.  She denies any gross hematuria, dysuria, or other urinary symptoms.  Urinalysis today is pending.  I recommended a renal ultrasound for evaluation of her left-sided flank pain.  We will call with results   Billey Co, MD  [facility] [address] [place], [place] [address] [phone]

## 2020-09-02 LAB — MICROSCOPIC EXAMINATION: Bacteria, UA: NONE SEEN

## 2020-09-02 LAB — URINALYSIS, COMPLETE
Bilirubin, UA: NEGATIVE
Glucose, UA: NEGATIVE
Ketones, UA: NEGATIVE
Leukocytes,UA: NEGATIVE
Nitrite, UA: NEGATIVE
RBC, UA: NEGATIVE
Specific Gravity, UA: 1.025 (ref 1.005–1.030)
Urobilinogen, Ur: 0.2 mg/dL (ref 0.2–1.0)
pH, UA: 5.5 (ref 5.0–7.5)

## 2020-09-11 ENCOUNTER — Other Ambulatory Visit: Payer: Self-pay

## 2020-09-11 ENCOUNTER — Ambulatory Visit
Admission: RE | Admit: 2020-09-11 | Discharge: 2020-09-11 | Disposition: A | Discharge status: Home / Self Care | Payer: Medicare Other | Source: Ambulatory Visit | Attending: Urology | Admitting: Urology

## 2020-09-11 DIAGNOSIS — N201 Calculus of ureter: Secondary | ICD-10-CM | POA: Insufficient documentation

## 2020-09-11 DIAGNOSIS — N2889 Other specified disorders of kidney and ureter: Secondary | ICD-10-CM | POA: Diagnosis not present

## 2020-09-11 DIAGNOSIS — N281 Cyst of kidney, acquired: Secondary | ICD-10-CM | POA: Diagnosis not present

## 2020-09-11 DIAGNOSIS — Z87442 Personal history of urinary calculi: Secondary | ICD-10-CM | POA: Diagnosis not present

## 2020-09-12 ENCOUNTER — Telehealth: Payer: Self-pay

## 2020-09-12 NOTE — Telephone Encounter (Signed)
Called pt, no answer. Left detailed message per DPR. Advised pt to call back for questions or concerns.  

## 2020-09-12 NOTE — Telephone Encounter (Signed)
-----   Message from Billey Co, MD sent at 09/12/2020  9:27 AM EDT ----- Doristine Devoid news, renal US completely normal.  Suspect musculoskeletal cause of her back pain, can follow-up with urology as needed  Nickolas Madrid, MD 09/12/2020

## 2020-09-15 DIAGNOSIS — F411 Generalized anxiety disorder: Secondary | ICD-10-CM | POA: Diagnosis not present

## 2020-09-15 DIAGNOSIS — F5105 Insomnia due to other mental disorder: Secondary | ICD-10-CM | POA: Diagnosis not present

## 2020-11-27 ENCOUNTER — Ambulatory Visit: Payer: Self-pay | Admitting: Urology

## 2020-12-03 DIAGNOSIS — N898 Other specified noninflammatory disorders of vagina: Secondary | ICD-10-CM | POA: Diagnosis not present

## 2020-12-03 DIAGNOSIS — D1801 Hemangioma of skin and subcutaneous tissue: Secondary | ICD-10-CM | POA: Diagnosis not present

## 2020-12-22 DIAGNOSIS — D1801 Hemangioma of skin and subcutaneous tissue: Secondary | ICD-10-CM | POA: Diagnosis not present

## 2020-12-24 DIAGNOSIS — M62830 Muscle spasm of back: Secondary | ICD-10-CM | POA: Diagnosis not present

## 2020-12-24 DIAGNOSIS — M5127 Other intervertebral disc displacement, lumbosacral region: Secondary | ICD-10-CM | POA: Diagnosis not present

## 2020-12-24 DIAGNOSIS — M9903 Segmental and somatic dysfunction of lumbar region: Secondary | ICD-10-CM | POA: Diagnosis not present

## 2020-12-24 DIAGNOSIS — F411 Generalized anxiety disorder: Secondary | ICD-10-CM | POA: Diagnosis not present

## 2020-12-24 DIAGNOSIS — F5105 Insomnia due to other mental disorder: Secondary | ICD-10-CM | POA: Diagnosis not present

## 2020-12-25 DIAGNOSIS — M9903 Segmental and somatic dysfunction of lumbar region: Secondary | ICD-10-CM | POA: Diagnosis not present

## 2020-12-25 DIAGNOSIS — M5127 Other intervertebral disc displacement, lumbosacral region: Secondary | ICD-10-CM | POA: Diagnosis not present

## 2020-12-25 DIAGNOSIS — M62830 Muscle spasm of back: Secondary | ICD-10-CM | POA: Diagnosis not present

## 2020-12-29 ENCOUNTER — Other Ambulatory Visit: Payer: Self-pay

## 2020-12-29 ENCOUNTER — Ambulatory Visit
Admission: RE | Admit: 2020-12-29 | Discharge: 2020-12-29 | Disposition: A | Discharge status: Home / Self Care | Payer: Medicare Other | Attending: Family Medicine | Admitting: Family Medicine

## 2020-12-29 ENCOUNTER — Ambulatory Visit (INDEPENDENT_AMBULATORY_CARE_PROVIDER_SITE_OTHER): Payer: Medicare Other | Admitting: Family Medicine

## 2020-12-29 ENCOUNTER — Encounter: Payer: Self-pay | Admitting: Family Medicine

## 2020-12-29 ENCOUNTER — Ambulatory Visit
Admission: RE | Admit: 2020-12-29 | Discharge: 2020-12-29 | Disposition: A | Discharge status: Home / Self Care | Payer: Medicare Other | Source: Ambulatory Visit | Attending: Family Medicine | Admitting: Family Medicine

## 2020-12-29 VITALS — BP 110/62 | HR 80 | Ht 63.0 in | Wt 104.0 lb

## 2020-12-29 DIAGNOSIS — M810 Age-related osteoporosis without current pathological fracture: Secondary | ICD-10-CM

## 2020-12-29 DIAGNOSIS — M545 Low back pain, unspecified: Secondary | ICD-10-CM

## 2020-12-29 DIAGNOSIS — M62830 Muscle spasm of back: Secondary | ICD-10-CM | POA: Diagnosis not present

## 2020-12-29 DIAGNOSIS — M5127 Other intervertebral disc displacement, lumbosacral region: Secondary | ICD-10-CM | POA: Diagnosis not present

## 2020-12-29 DIAGNOSIS — M9903 Segmental and somatic dysfunction of lumbar region: Secondary | ICD-10-CM | POA: Diagnosis not present

## 2020-12-29 MED ORDER — PREDNISONE 10 MG PO TABS: 10.0000 mg | ORAL_TABLET | Freq: Every day | ORAL | 0 refills | Status: DC

## 2020-12-29 MED ORDER — CYCLOBENZAPRINE HCL 5 MG PO TABS
5.0000 mg | ORAL_TABLET | Freq: Three times a day (TID) | ORAL | 1 refills | Status: DC | PRN
Start: 2020-12-29 — End: 2022-01-15

## 2020-12-29 MED ORDER — MELOXICAM 7.5 MG PO TABS
7.5000 mg | ORAL_TABLET | Freq: Every day | ORAL | 0 refills | Status: DC
Start: 2020-12-29 — End: 2022-01-15
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
## 2020-12-31 DIAGNOSIS — M62830 Muscle spasm of back: Secondary | ICD-10-CM | POA: Diagnosis not present

## 2020-12-31 DIAGNOSIS — M5127 Other intervertebral disc displacement, lumbosacral region: Secondary | ICD-10-CM | POA: Diagnosis not present

## 2020-12-31 DIAGNOSIS — M9903 Segmental and somatic dysfunction of lumbar region: Secondary | ICD-10-CM | POA: Diagnosis not present

## 2021-01-05 DIAGNOSIS — M5127 Other intervertebral disc displacement, lumbosacral region: Secondary | ICD-10-CM | POA: Diagnosis not present

## 2021-01-05 DIAGNOSIS — M62830 Muscle spasm of back: Secondary | ICD-10-CM | POA: Diagnosis not present

## 2021-01-05 DIAGNOSIS — M9903 Segmental and somatic dysfunction of lumbar region: Secondary | ICD-10-CM | POA: Diagnosis not present

## 2021-01-07 ENCOUNTER — Ambulatory Visit (INDEPENDENT_AMBULATORY_CARE_PROVIDER_SITE_OTHER): Payer: Medicare Other

## 2021-01-07 DIAGNOSIS — M62830 Muscle spasm of back: Secondary | ICD-10-CM | POA: Diagnosis not present

## 2021-01-07 DIAGNOSIS — M5127 Other intervertebral disc displacement, lumbosacral region: Secondary | ICD-10-CM | POA: Diagnosis not present

## 2021-01-07 DIAGNOSIS — Z0000 Encounter for general adult medical examination without abnormal findings: Secondary | ICD-10-CM

## 2021-01-07 DIAGNOSIS — M9903 Segmental and somatic dysfunction of lumbar region: Secondary | ICD-10-CM | POA: Diagnosis not present

## 2021-01-07 NOTE — Progress Notes (Signed)
Subjective:   Amanda Osborn is a 65 y.o. female who presents for Medicare Annual (Subsequent) preventive examination.  Virtual Visit via Telephone Note  I connected with  Amanda Osborn on 01/07/21 at  2:40 PM EDT by telephone and verified that I am speaking with the correct person using two identifiers.  Location: Patient: home Provider: [facility] Persons participating in the virtual visit: [place]   I discussed the limitations, risks, security and privacy concerns of performing an evaluation and management service by telephone and the availability of in person appointments. The patient expressed understanding and agreed to proceed.  Interactive audio and video telecommunications were attempted between this nurse and patient, however failed, due to patient having technical difficulties OR patient did not have access to video capability.  We continued and completed visit with audio only.  Some vital signs may be absent or patient reported.   [name], LPN   Review of Systems     Cardiac Risk Factors include: advanced age (>67men, >36 women);dyslipidemia     Objective:    Today's Vitals   01/07/21 1449  PainSc: 5    There is no height or weight on file to calculate BMI.  Advanced Directives 01/07/2021 01/07/2020 09/21/2019 09/13/2019 12/28/2017 12/28/2017 06/28/2016  Does Patient Have a Medical Advance Directive? Yes Yes Yes No Yes No No  Type of Advance Directive Living will [place];Living will Living will - Living will - -  Does patient want to make changes to medical advance directive? - - - - No - Patient declined - -  Copy of [place] in Chart? - No - copy requested - - - - -    Current Medications (verified) Outpatient Encounter Medications as of 01/07/2021  Medication Sig   acyclovir (ZOVIRAX) 400 MG tablet Take 400 mg by mouth 2 (two) times daily as needed (fever blister).    aspirin EC 325 MG EC  tablet Take 1 tablet (325 mg total) by mouth daily.   buPROPion (WELLBUTRIN SR) 100 MG 12 hr tablet Take 100 mg by mouth every morning.    Cholecalciferol (VITAMIN D-3) 125 MCG (5000 UT) TABS Take 5,000 Units by mouth daily.    cyclobenzaprine (FLEXERIL) 5 MG tablet Take 1 tablet (5 mg total) by mouth 3 (three) times daily as needed for muscle spasms.   meloxicam (MOBIC) 7.5 MG tablet Take 1 tablet (7.5 mg total) by mouth daily.   Multiple Vitamin (MULTIVITAMIN WITH MINERALS) TABS tablet Take 1 tablet by mouth daily. Alive Multivitamin   predniSONE (DELTASONE) 10 MG tablet Take 1 tablet (10 mg total) by mouth daily with breakfast. Taper 4,4,4,3,3,3,2,2,2,1,1,1   rosuvastatin (CRESTOR) 20 MG tablet Take 1 tablet (20 mg total) by mouth at bedtime.   sertraline (ZOLOFT) 25 MG tablet Take 1 tablet (25 mg total) by mouth every morning.   traZODone (DESYREL) 50 MG tablet Take 1.5 tablets (75 mg total) by mouth at bedtime.   No facility-administered encounter medications on file as of 01/07/2021.    Allergies (verified) Montelukast sodium   History: Past Medical History:  Diagnosis Date   Allergy    Anxiety    Arthritis    Breast cancer ([place]) 2006   BIL BREASTS-LOBULAR CARCINOMA IN SITU   Dizziness    Headache    History of CVA (cerebrovascular accident)    Hyperlipidemia    Osteoporosis    Stroke ([place]) 2019   NO RESIDUAL EFFECTS   Past Surgical  History:  Procedure Laterality Date   AUGMENTATION MAMMAPLASTY Bilateral 2011   BREAST CYST ASPIRATION Bilateral    BREAST EXCISIONAL BIOPSY Left 2006   breast ca   BREAST LUMPECTOMY     CARDIAC CATHETERIZATION     COLONOSCOPY     COLONOSCOPY WITH PROPOFOL N/A 06/28/2016   Procedure: COLONOSCOPY WITH PROPOFOL;  Surgeon: Lollie Sails, MD;  Location: [facility] ENDOSCOPY;  Service: Endoscopy;  Laterality: N/A;   CYSTOSCOPY W/ URETERAL STENT PLACEMENT Left 09/21/2019   Procedure: CYSTOSCOPY WITH RETROGRADE PYELOGRAM/URETERAL STENT PLACEMENT;   Surgeon: Billey Co, MD;  Location: [facility] ORS;  Service: Urology;  Laterality: Left;   LYMPH NODE BIOPSY     NECK   MASTECTOMY Bilateral    TEE WITHOUT CARDIOVERSION N/A 12/30/2017   Procedure: TRANSESOPHAGEAL ECHOCARDIOGRAM (TEE);  Surgeon: Teodoro Spray, MD;  Location: [facility] ORS;  Service: Cardiovascular;  Laterality: N/A;   URETEROSCOPY Left 09/21/2019   Procedure: URETEROSCOPY-DIAGNOSTIC;  Surgeon: Billey Co, MD;  Location: [facility] ORS;  Service: Urology;  Laterality: Left;   VAGINAL HYSTERECTOMY     Family History  Problem Relation Age of Onset   Breast cancer Paternal 72    Breast cancer Paternal Grandmother    Dementia Mother        passed at age 71   Leukemia Father        passed at age 17   Leukemia Brother    Social History   Socioeconomic History   Marital status: Married    Spouse name: Not on file   Number of children: 2   Years of education: college   Highest education level: Bachelor's degree (e.g., BA, AB, BS)  Occupational History   Occupation: retired from Data processing manager work  Tobacco Use   Smoking status: Never   Smokeless tobacco: Never  Vaping Use   Vaping Use: Never used  Substance and Sexual Activity   Alcohol use: No    Alcohol/week: 0.0 standard drinks   Drug use: No   Sexual activity: Yes  Other Topics Concern   Not on file  Social History Narrative   Lives at home with her husband, [name].   Right-handed.   No caffeine use.   Social Determinants of Health   Financial Resource Strain: Low Risk    Difficulty of Paying Living Expenses: Not hard at all  Food Insecurity: No Food Insecurity   Worried About Charity fundraiser in the Last Year: Never true   [place] in the Last Year: Never true  Transportation Needs: No Transportation Needs   Lack of Transportation (Medical): No   Lack of Transportation (Non-Medical): No  Physical Activity: Inactive   Days of Exercise per Week: 0 days   Minutes of Exercise per Session: 0  min  Stress: No Stress Concern Present   Feeling of Stress : Not at all  Social Connections: Moderately Integrated   Frequency of Communication with Friends and Family: More than three times a week   Frequency of Social Gatherings with Friends and Family: More than three times a week   Attends Religious Services: More than 4 times per year   Active Member of [employer] or Organizations: No   Attends Music therapist: Never   Marital Status: Married    Tobacco Counseling Counseling given: Not Answered   Clinical Intake:  Pre-visit preparation completed: Yes  Pain : 0-10 Pain Score: 5  Pain Type: Chronic pain Pain Location: Back Pain Orientation: Lower Pain Descriptors /  Indicators: Aching, Sore Pain Onset: More than a month ago Pain Frequency: Intermittent     Nutritional Risks: None Diabetes: No  How often do you need to have someone help you when you read instructions, pamphlets, or other written materials from your doctor or pharmacy?: 1 - Never    Interpreter Needed?: No  Information entered by :: [name] LPN   Activities of Daily Living In your present state of health, do you have any difficulty performing the following activities: 01/07/2021  Hearing? N  Vision? N  Difficulty concentrating or making decisions? N  Walking or climbing stairs? N  Dressing or bathing? N  Doing errands, shopping? N  Preparing Food and eating ? N  Using the Toilet? N  In the past six months, have you accidently leaked urine? N  Do you have problems with loss of bowel control? N  Managing your Medications? N  Managing your Finances? N  Housekeeping or managing your Housekeeping? N  Some recent data might be hidden    Patient Care Team: Juline Patch, MD as PCP - General (Family Medicine) Schermerhorn, Gwen Her, MD as Referring Physician (Obstetrics and Gynecology)  Indicate any recent Medical Services you may have received from other than [facility] providers in  the past year (date may be approximate).     Assessment:   This is a routine wellness examination for Jaynie.  Hearing/Vision screen Hearing Screening - Comments::  Pt denies hearing difficulty Vision Screening - Comments:: Annual vision screenings done by [facility]  Dietary issues and exercise activities discussed: Current Exercise Habits: The patient does not participate in regular exercise at present, Exercise limited by: orthopedic condition(s)   Goals Addressed   None    Depression Screen PHQ 2/9 Scores 01/07/2021 12/29/2020 08/08/2020 07/22/2020 01/07/2020 09/19/2019 08/13/2019  PHQ - 2 Score 0 0 0 0 0 0 0  PHQ- 9 Score - 0 0 0 - 3 3    Fall Risk Fall Risk  01/07/2021 01/07/2020 09/19/2019 08/13/2019 05/22/2019  Falls in the past year? 0 0 0 0 0  Number falls in past yr: 0 0 - - -  Injury with Fall? 0 0 - - -  Risk for fall due to : No Fall Risks No Fall Risks - - -  Follow up Falls prevention discussed Falls prevention discussed Falls evaluation completed Falls evaluation completed Falls evaluation completed    [place]:  Any stairs in or around the home? Yes  If so, are there any without handrails? No  Home free of loose throw rugs in walkways, pet beds, electrical cords, etc? Yes  Adequate lighting in your home to reduce risk of falls? Yes   ASSISTIVE DEVICES UTILIZED TO PREVENT FALLS:  Life alert? No  Use of a cane, walker or w/c? No  Grab bars in the bathroom? No Shower chair or bench in shower? No  Elevated toilet seat or a handicapped toilet? Yes   TIMED UP AND GO:  Was the test performed? No . Telephonic visit.   Cognitive Function: Normal cognitive status assessed by direct observation by this Nurse Health Advisor. No abnormalities found.       6CIT Screen 01/07/2020  What Year? 0 points  What month? 0 points  What time? 0 points  Count back from 20 0 points  Months in reverse 0 points  Repeat phrase 0 points   Total Score 0    Immunizations Immunization History  Administered Date(s)  Administered   Fluad Quad(high Dose 65+) 12/21/2018   Influenza,inj,Quad PF,6+ Mos 12/29/2017   Influenza-Unspecified 01/11/2016, 12/29/2017   PFIZER(Purple Top)SARS-COV-2 Vaccination 05/28/2019, 06/18/2019, 03/20/2020   PNEUMOCOCCAL CONJUGATE-20 08/08/2020   Pneumococcal Conjugate-13 12/28/2018    TDAP status: Due, Education has been provided regarding the importance of this vaccine. Advised may receive this vaccine at local pharmacy or Health Dept. Aware to provide a copy of the vaccination record if obtained from local pharmacy or Health Dept. Verbalized acceptance and understanding.  Flu Vaccine status: Due, Education has been provided regarding the importance of this vaccine. Advised may receive this vaccine at local pharmacy or Health Dept. Aware to provide a copy of the vaccination record if obtained from local pharmacy or Health Dept. Verbalized acceptance and understanding.  Pneumococcal vaccine status: Up to date  Covid-19 vaccine status: Completed vaccines  Qualifies for Shingles Vaccine? Yes   Zostavax completed No   Shingrix Completed?: No.    Education has been provided regarding the importance of this vaccine. Patient has been advised to call insurance company to determine out of pocket expense if they have not yet received this vaccine. Advised may also receive vaccine at local pharmacy or Health Dept. Verbalized acceptance and understanding.  Screening Tests Health Maintenance  Topic Date Due   INFLUENZA VACCINE  01/08/2021 (Originally 11/10/2020)   Zoster Vaccines- Shingrix (1 of 2) 03/30/2021 (Originally 11/11/1974)   TETANUS/TDAP  07/22/2021 (Originally [birth year]11/1974)   Hepatitis C Screening  07/22/2021 (Originally 11/12/1973)   MAMMOGRAM  03/18/2021   COLONOSCOPY (Pts 45-40yrs Insurance coverage will need to be confirmed)  06/29/2026   DEXA SCAN  Completed   HPV VACCINES  Aged Out   COVID-19  Vaccine  Discontinued    Health Maintenance  There are no preventive care reminders to display for this patient.   Colorectal cancer screening: Type of screening: Colonoscopy. Completed 06/28/16. Repeat every 10 years  Mammogram status: No longer required due to pt gets yearly MRI due to hx of breast cancer. Ordered by GYN  Bone Density status: Completed 03/07/19. Results reflect: Bone density results: OSTEOPOROSIS. Repeat every 2 years.  Lung Cancer Screening: (Low Dose CT Chest recommended if Age 13-80 years, 30 pack-year currently smoking OR have quit w/in 15years.) does not qualify.   Additional Screening:  Hepatitis C Screening: does qualify; postponed  Vision Screening: Recommended annual ophthalmology exams for early detection of glaucoma and other disorders of the eye. Is the patient up to date with their annual eye exam?  Yes  Who is the provider or what is the name of the office in which the patient attends annual eye exams? [facility].   Dental Screening: Recommended annual dental exams for proper oral hygiene  Community Resource Referral / Chronic Care Management: CRR required this visit?  No   CCM required this visit?  No      Plan:     I have personally reviewed and noted the following in the patient's chart:   Medical and social history Use of alcohol, tobacco or illicit drugs  Current medications and supplements including opioid prescriptions.  Functional ability and status Nutritional status Physical activity Advanced directives List of other physicians Hospitalizations, surgeries, and ER visits in previous 12 months Vitals Screenings to include cognitive, depression, and falls Referrals and appointments  In addition, I have reviewed and discussed with patient certain preventive protocols, quality metrics, and best practice recommendations. A written personalized care plan for preventive services as well as general preventive health  recommendations were provided to patient.     [name], LPN   [id number]   Nurse Notes: none

## 2021-01-07 NOTE — Patient Instructions (Signed)
Amanda Osborn , Thank you for taking time to come for your Medicare Wellness Visit. I appreciate your ongoing commitment to your health goals. Please review the following plan we discussed and let me know if I can assist you in the future.   Screening recommendations/referrals: Colonoscopy: done 06/28/16. Repeat 06/2026 Mammogram: MRI ordered by GYN Bone Density: done 03/07/19. Due 02/2021 Dr. Gabriel Carina Recommended yearly ophthalmology/optometry visit for glaucoma screening and checkup Recommended yearly dental visit for hygiene and checkup  Vaccinations: Influenza vaccine: due Pneumococcal vaccine: done 08/08/20 Tdap vaccine: due Shingles vaccine: Shingrix discussed. Please contact your pharmacy for coverage information.  Covid-19: done 05/28/19, 06/18/19 & 03/20/20  Advanced directives: Please bring a copy of your health care power of attorney and living will to the office at your convenience.   Conditions/risks identified: Recommend increasing physical activity as tolerated  Next appointment: Follow up in one year for your annual wellness visit    Preventive Care 65 Years and Older, Female Preventive care refers to lifestyle choices and visits with your health care provider that can promote health and wellness. What does preventive care include? A yearly physical exam. This is also called an annual well check. Dental exams once or twice a year. Routine eye exams. Ask your health care provider how often you should have your eyes checked. Personal lifestyle choices, including: Daily care of your teeth and gums. Regular physical activity. Eating a healthy diet. Avoiding tobacco and drug use. Limiting alcohol use. Practicing safe sex. Taking low-dose aspirin every day. Taking vitamin and mineral supplements as recommended by your health care provider. What happens during an annual well check? The services and screenings done by your health care provider during your annual well check will  depend on your age, overall health, lifestyle risk factors, and family history of disease. Counseling  Your health care provider may ask you questions about your: Alcohol use. Tobacco use. Drug use. Emotional well-being. Home and relationship well-being. Sexual activity. Eating habits. History of falls. Memory and ability to understand (cognition). Work and work Statistician. Reproductive health. Screening  You may have the following tests or measurements: Height, weight, and BMI. Blood pressure. Lipid and cholesterol levels. These may be checked every 5 years, or more frequently if you are over 45 years old. Skin check. Lung cancer screening. You may have this screening every year starting at age 49 if you have a 30-pack-year history of smoking and currently smoke or have quit within the past 15 years. Fecal occult blood test (FOBT) of the stool. You may have this test every year starting at age 34. Flexible sigmoidoscopy or colonoscopy. You may have a sigmoidoscopy every 5 years or a colonoscopy every 10 years starting at age 32. Hepatitis C blood test. Hepatitis B blood test. Sexually transmitted disease (STD) testing. Diabetes screening. This is done by checking your blood sugar (glucose) after you have not eaten for a while (fasting). You may have this done every 1-3 years. Bone density scan. This is done to screen for osteoporosis. You may have this done starting at age 65. Mammogram. This may be done every 1-2 years. Talk to your health care provider about how often you should have regular mammograms. Talk with your health care provider about your test results, treatment options, and if necessary, the need for more tests. Vaccines  Your health care provider may recommend certain vaccines, such as: Influenza vaccine. This is recommended every year. Tetanus, diphtheria, and acellular pertussis (Tdap, Td) vaccine. You may need a  Td booster every 10 years. Zoster vaccine. You may  need this after age 42. Pneumococcal 13-valent conjugate (PCV13) vaccine. One dose is recommended after age 68. Pneumococcal polysaccharide (PPSV23) vaccine. One dose is recommended after age 53. Talk to your health care provider about which screenings and vaccines you need and how often you need them. This information is not intended to replace advice given to you by your health care provider. Make sure you discuss any questions you have with your health care provider. Document Released: 04/25/2015 Document Revised: 12/17/2015 Document Reviewed: 01/28/2015 Elsevier Interactive Patient Education  2017 [place] Prevention in the Home Falls can cause injuries. They can happen to people of all ages. There are many things you can do to make your home safe and to help prevent falls. What can I do on the outside of my home? Regularly fix the edges of walkways and driveways and fix any cracks. Remove anything that might make you trip as you walk through a door, such as a raised step or threshold. Trim any bushes or trees on the path to your home. Use bright outdoor lighting. Clear any walking paths of anything that might make someone trip, such as rocks or tools. Regularly check to see if handrails are loose or broken. Make sure that both sides of any steps have handrails. Any raised decks and porches should have guardrails on the edges. Have any leaves, snow, or ice cleared regularly. Use sand or salt on walking paths during winter. Clean up any spills in your garage right away. This includes oil or grease spills. What can I do in the bathroom? Use night lights. Install grab bars by the toilet and in the tub and shower. Do not use towel bars as grab bars. Use non-skid mats or decals in the tub or shower. If you need to sit down in the shower, use a plastic, non-slip stool. Keep the floor dry. Clean up any water that spills on the floor as soon as it happens. Remove soap buildup in  the tub or shower regularly. Attach bath mats securely with double-sided non-slip rug tape. Do not have throw rugs and other things on the floor that can make you trip. What can I do in the bedroom? Use night lights. Make sure that you have a light by your bed that is easy to reach. Do not use any sheets or blankets that are too big for your bed. They should not hang down onto the floor. Have a firm chair that has side arms. You can use this for support while you get dressed. Do not have throw rugs and other things on the floor that can make you trip. What can I do in the kitchen? Clean up any spills right away. Avoid walking on wet floors. Keep items that you use a lot in easy-to-reach places. If you need to reach something above you, use a strong step stool that has a grab bar. Keep electrical cords out of the way. Do not use floor polish or wax that makes floors slippery. If you must use wax, use non-skid floor wax. Do not have throw rugs and other things on the floor that can make you trip. What can I do with my stairs? Do not leave any items on the stairs. Make sure that there are handrails on both sides of the stairs and use them. Fix handrails that are broken or loose. Make sure that handrails are as long as the stairways. Check any  carpeting to make sure that it is firmly attached to the stairs. Fix any carpet that is loose or worn. Avoid having throw rugs at the top or bottom of the stairs. If you do have throw rugs, attach them to the floor with carpet tape. Make sure that you have a light switch at the top of the stairs and the bottom of the stairs. If you do not have them, ask someone to add them for you. What else can I do to help prevent falls? Wear shoes that: Do not have high heels. Have rubber bottoms. Are comfortable and fit you well. Are closed at the toe. Do not wear sandals. If you use a stepladder: Make sure that it is fully opened. Do not climb a closed  stepladder. Make sure that both sides of the stepladder are locked into place. Ask someone to hold it for you, if possible. Clearly mark and make sure that you can see: Any grab bars or handrails. First and last steps. Where the edge of each step is. Use tools that help you move around (mobility aids) if they are needed. These include: Canes. Walkers. Scooters. Crutches. Turn on the lights when you go into a dark area. Replace any light bulbs as soon as they burn out. Set up your furniture so you have a clear path. Avoid moving your furniture around. If any of your floors are uneven, fix them. If there are any pets around you, be aware of where they are. Review your medicines with your doctor. Some medicines can make you feel dizzy. This can increase your chance of falling. Ask your doctor what other things that you can do to help prevent falls. This information is not intended to replace advice given to you by your health care provider. Make sure you discuss any questions you have with your health care provider. Document Released: 01/23/2009 Document Revised: 09/04/2015 Document Reviewed: 05/03/2014 Elsevier Interactive Patient Education  2017 [employer].

## 2021-01-12 DIAGNOSIS — M5127 Other intervertebral disc displacement, lumbosacral region: Secondary | ICD-10-CM | POA: Diagnosis not present

## 2021-01-12 DIAGNOSIS — M62830 Muscle spasm of back: Secondary | ICD-10-CM | POA: Diagnosis not present

## 2021-01-12 DIAGNOSIS — M9903 Segmental and somatic dysfunction of lumbar region: Secondary | ICD-10-CM | POA: Diagnosis not present

## 2021-01-15 DIAGNOSIS — M9903 Segmental and somatic dysfunction of lumbar region: Secondary | ICD-10-CM | POA: Diagnosis not present

## 2021-01-15 DIAGNOSIS — M62830 Muscle spasm of back: Secondary | ICD-10-CM | POA: Diagnosis not present

## 2021-01-15 DIAGNOSIS — M5127 Other intervertebral disc displacement, lumbosacral region: Secondary | ICD-10-CM | POA: Diagnosis not present

## 2021-01-21 ENCOUNTER — Ambulatory Visit: Payer: Medicare Other | Admitting: Family Medicine

## 2021-01-21 ENCOUNTER — Other Ambulatory Visit: Payer: Self-pay

## 2021-01-21 ENCOUNTER — Ambulatory Visit (INDEPENDENT_AMBULATORY_CARE_PROVIDER_SITE_OTHER): Payer: Medicare Other | Admitting: Family Medicine

## 2021-01-21 ENCOUNTER — Encounter: Payer: Self-pay | Admitting: Family Medicine

## 2021-01-21 VITALS — BP 100/64 | HR 68 | Ht 63.0 in | Wt 104.0 lb

## 2021-01-21 DIAGNOSIS — Z23 Encounter for immunization: Secondary | ICD-10-CM

## 2021-01-21 DIAGNOSIS — E782 Mixed hyperlipidemia: Secondary | ICD-10-CM

## 2021-01-21 DIAGNOSIS — F324 Major depressive disorder, single episode, in partial remission: Secondary | ICD-10-CM

## 2021-01-21 MED ORDER — ROSUVASTATIN CALCIUM 20 MG PO TABS: 20.0000 mg | ORAL_TABLET | Freq: Every day | ORAL | 1 refills | Status: DC

## 2021-01-21 MED ORDER — TRAZODONE HCL 50 MG PO TABS: 75.0000 mg | ORAL_TABLET | Freq: Every day | ORAL | 1 refills | Status: DC

## 2021-01-21 MED ORDER — SERTRALINE HCL 25 MG PO TABS: 25.0000 mg | ORAL_TABLET | ORAL | 1 refills | Status: DC

## 2021-01-21 NOTE — Progress Notes (Signed)
Date:  01/21/2021   Name:  Amanda Osborn   DOB:  05-Sep-1955   MRN:  [id number]   Chief Complaint: Insomnia, Depression, Hyperlipidemia, and Flu Vaccine  Insomnia Primary symptoms: sleep disturbance.   The current episode started more than one month. The problem occurs intermittently. The problem has been waxing and waning since onset. The symptoms are relieved by medication. Past treatments include medication. The treatment provided significant relief. Typical bedtime:  10-11 P.M..  How long after going to bed to you fall asleep: 15-30 minutes.   PMH includes: depression.   Depression      (urination)  This is a chronic problem.  The current episode started more than 1 year ago.   The problem occurs rarely.  The problem has been resolved since onset.  Associated symptoms include insomnia.  Associated symptoms include no decreased concentration, no fatigue, no helplessness, no hopelessness, not irritable, no restlessness, no decreased interest, no appetite change, no body aches, no myalgias, no headaches, no indigestion, not sad and no suicidal ideas.     The symptoms are aggravated by family issues.  Past treatments include SSRIs - Selective serotonin reuptake inhibitors.  Compliance with treatment is good.  Previous treatment provided significant relief. Hyperlipidemia This is a chronic problem. The current episode started more than 1 year ago. Pertinent negatives include no chest pain, focal sensory loss, focal weakness, leg pain, myalgias or shortness of breath.   Lab Results  Component Value Date   CREATININE 0.91 08/08/2020   BUN 26 08/08/2020   NA 143 08/08/2020   K 4.4 08/08/2020   CL 104 08/08/2020   CO2 27 08/08/2020   Lab Results  Component Value Date   CHOL 141 08/08/2020   HDL 69 08/08/2020   LDLCALC 60 08/08/2020   TRIG 57 08/08/2020   CHOLHDL 2.1 05/17/2018   Lab Results  Component Value Date   TSH 1.430 05/17/2018   Lab Results  Component Value Date    HGBA1C 5.3 12/29/2017   Lab Results  Component Value Date   WBC 4.8 09/13/2019   HGB 12.4 09/13/2019   HCT 36.4 09/13/2019   MCV 95.3 09/13/2019   PLT 175 09/13/2019   Lab Results  Component Value Date   ALT 30 08/08/2020   AST 31 08/08/2020   ALKPHOS 48 08/08/2020   BILITOT 0.4 08/08/2020     Review of Systems  Constitutional:  Negative for appetite change, chills, fatigue and fever.  HENT:  Negative for drooling, ear discharge, ear pain and sore throat.   Respiratory:  Negative for cough, shortness of breath and wheezing.   Cardiovascular:  Negative for chest pain, palpitations and leg swelling.  Gastrointestinal:  Negative for abdominal pain, blood in stool, constipation, diarrhea and nausea.  Endocrine: Negative for polydipsia.  Genitourinary:  Negative for dysuria, frequency, hematuria and urgency.  Musculoskeletal:  Negative for back pain, myalgias and neck pain.  Skin:  Negative for rash.  Allergic/Immunologic: Negative for environmental allergies.  Neurological:  Negative for dizziness, focal weakness and headaches.  Hematological:  Does not bruise/bleed easily.  Psychiatric/Behavioral:  Positive for depression and sleep disturbance. Negative for decreased concentration and suicidal ideas. The patient has insomnia. The patient is not nervous/anxious.    Patient Active Problem List   Diagnosis Date Noted   Pelvic pain in female 08/30/2019   Dizziness 03/21/2019   CVA (cerebral vascular accident) ([place]) 12/29/2017   Slurred speech 12/28/2017   Malignant neoplastic disease ([place]) 03/18/2015  Chondromalacia of patella 09/13/2014   Bursitis 09/13/2014   Allergic rhinitis 09/13/2014   Cancer ([place]) 09/13/2014   Cervical disc disease 09/13/2014    Allergies  Allergen Reactions   Montelukast Sodium     Caused headaches    Past Surgical History:  Procedure Laterality Date   AUGMENTATION MAMMAPLASTY Bilateral 2011   BREAST CYST ASPIRATION Bilateral    BREAST  EXCISIONAL BIOPSY Left 2006   breast ca   BREAST LUMPECTOMY     CARDIAC CATHETERIZATION     COLONOSCOPY     COLONOSCOPY WITH PROPOFOL N/A 06/28/2016   Procedure: COLONOSCOPY WITH PROPOFOL;  Surgeon: Lollie Sails, MD;  [facility] ENDOSCOPY;  Service: Endoscopy;  Laterality: N/A;   CYSTOSCOPY W/ URETERAL STENT PLACEMENT Left 09/21/2019   Procedure: CYSTOSCOPY WITH RETROGRADE PYELOGRAM/URETERAL STENT PLACEMENT;  Surgeon: Billey Co, MD;  Location: [facility] ORS;  Service: Urology;  Laterality: Left;   LYMPH NODE BIOPSY     NECK   MASTECTOMY Bilateral    TEE WITHOUT CARDIOVERSION N/A 12/30/2017   Procedure: TRANSESOPHAGEAL ECHOCARDIOGRAM (TEE);  Surgeon: Teodoro Spray, MD;  Location: [facility] ORS;  Service: Cardiovascular;  Laterality: N/A;   URETEROSCOPY Left 09/21/2019   Procedure: URETEROSCOPY-DIAGNOSTIC;  Surgeon: Billey Co, MD;  Location: [facility] ORS;  Service: Urology;  Laterality: Left;   VAGINAL HYSTERECTOMY      Social History   Tobacco Use   Smoking status: Never   Smokeless tobacco: Never  Vaping Use   Vaping Use: Never used  Substance Use Topics   Alcohol use: No    Alcohol/week: 0.0 standard drinks   Drug use: No     Medication list has been reviewed and updated.  Current Meds  Medication Sig   acyclovir (ZOVIRAX) 400 MG tablet Take 400 mg by mouth 2 (two) times daily as needed (fever blister).    aspirin EC 325 MG EC tablet Take 1 tablet (325 mg total) by mouth daily.   buPROPion (WELLBUTRIN SR) 100 MG 12 hr tablet Take 100 mg by mouth every morning.    Cholecalciferol (VITAMIN D-3) 125 MCG (5000 UT) TABS Take 5,000 Units by mouth daily.    cyclobenzaprine (FLEXERIL) 5 MG tablet Take 1 tablet (5 mg total) by mouth 3 (three) times daily as needed for muscle spasms.   Multiple Vitamin (MULTIVITAMIN WITH MINERALS) TABS tablet Take 1 tablet by mouth daily. Alive Multivitamin   rosuvastatin (CRESTOR) 20 MG tablet Take 1 tablet (20 mg total) by mouth at bedtime.    sertraline (ZOLOFT) 25 MG tablet Take 1 tablet (25 mg total) by mouth every morning.   traZODone (DESYREL) 50 MG tablet Take 1.5 tablets (75 mg total) by mouth at bedtime.    PHQ 2/9 Scores 01/21/2021 01/07/2021 12/29/2020 08/08/2020  PHQ - 2 Score 0 0 0 0  PHQ- 9 Score 0 - 0 0    GAD 7 : Generalized Anxiety Score 01/21/2021 12/29/2020 08/08/2020 07/22/2020  Nervous, Anxious, on Edge 0 0 0 0  Control/stop worrying 0 0 0 0  Worry too much - different things 0 0 0 0  Trouble relaxing 0 0 0 0  Restless 0 0 0 0  Easily annoyed or irritable 0 0 0 0  Afraid - awful might happen 0 0 0 0  Total GAD 7 Score 0 0 0 0  Anxiety Difficulty - - - -    BP Readings from Last 3 Encounters:  12/29/20 110/62  09/01/20 120/75  08/08/20 100/60    Physical  Exam Vitals and nursing note reviewed.  Constitutional:      General: She is not irritable.    Appearance: She is well-developed.  HENT:     Head: Normocephalic.     Right Ear: Tympanic membrane, ear canal and external ear normal. There is no impacted cerumen.     Left Ear: Tympanic membrane, ear canal and external ear normal. There is no impacted cerumen.     Nose: Nose normal.  Eyes:     General: Lids are everted, no foreign bodies appreciated. No scleral icterus.       Left eye: No foreign body or hordeolum.     Conjunctiva/sclera: Conjunctivae normal.     Right eye: Right conjunctiva is not injected.     Left eye: Left conjunctiva is not injected.     Pupils: Pupils are equal, round, and reactive to light.  Neck:     Thyroid: No thyromegaly.     Vascular: No JVD.     Trachea: No tracheal deviation.  Cardiovascular:     Rate and Rhythm: Normal rate and regular rhythm.     Heart sounds: Normal heart sounds. No murmur heard.   No friction rub. No gallop.  Pulmonary:     Effort: Pulmonary effort is normal. No respiratory distress.     Breath sounds: Normal breath sounds. No wheezing, rhonchi or rales.  Abdominal:     General: Bowel  sounds are normal.     Palpations: Abdomen is soft. There is no mass.     Tenderness: There is no abdominal tenderness. There is no guarding or rebound.  Musculoskeletal:        General: No tenderness. Normal range of motion.     Cervical back: Normal range of motion and neck supple.  Lymphadenopathy:     Cervical: No cervical adenopathy.  Skin:    General: Skin is warm.     Findings: No rash.  Neurological:     Mental Status: She is alert and oriented to person, place, and time.     Cranial Nerves: No cranial nerve deficit.     Deep Tendon Reflexes: Reflexes normal.  Psychiatric:        Mood and Affect: Mood is not anxious or depressed.    Wt Readings from Last 3 Encounters:  01/21/21 104 lb (47.2 kg)  12/29/20 104 lb (47.2 kg)  09/01/20 102 lb (46.3 kg)    Pulse 68   Ht 5\' 3"  (1.6 m)   Wt 104 lb (47.2 kg)   BMI 18.42 kg/m   Assessment and Plan:  1. Mixed hyperlipidemia Chronic.  Controlled.  Stable.  Continue rosuvastatin 20 mg 1 nightly. - rosuvastatin (CRESTOR) 20 MG tablet; Take 1 tablet (20 mg total) by mouth at bedtime.  Dispense: 90 tablet; Refill: 1  2. Major depressive disorder in partial remission, unspecified whether recurrent (HCC) Chronic.  Controlled.  Stable.  PHQ is 0 and gad score is 0.  Patient is having some more anxiety and depression is doing relatively well she is only on 25 mg daily.  We will increase to taking 2 of those for 50 mg and if it is tolerated we will refill at 50 mg. - sertraline (ZOLOFT) 25 MG tablet; Take 1 tablet (25 mg total) by mouth every morning.  Dispense: 90 tablet; Refill: 1 - traZODone (DESYREL) 50 MG tablet; Take 1.5 tablets (75 mg total) by mouth at bedtime.  Dispense: 135 tablet; Refill: 1  3. Need for immunization against influenza Discussed  and administered. - Flu Vaccine QUAD High Dose(Fluad)

## 2021-01-26 DIAGNOSIS — M9903 Segmental and somatic dysfunction of lumbar region: Secondary | ICD-10-CM | POA: Diagnosis not present

## 2021-01-26 DIAGNOSIS — M5127 Other intervertebral disc displacement, lumbosacral region: Secondary | ICD-10-CM | POA: Diagnosis not present

## 2021-01-26 DIAGNOSIS — M62830 Muscle spasm of back: Secondary | ICD-10-CM | POA: Diagnosis not present

## 2021-02-02 DIAGNOSIS — M5127 Other intervertebral disc displacement, lumbosacral region: Secondary | ICD-10-CM | POA: Diagnosis not present

## 2021-02-02 DIAGNOSIS — M62830 Muscle spasm of back: Secondary | ICD-10-CM | POA: Diagnosis not present

## 2021-02-02 DIAGNOSIS — M9903 Segmental and somatic dysfunction of lumbar region: Secondary | ICD-10-CM | POA: Diagnosis not present

## 2021-02-09 DIAGNOSIS — M62830 Muscle spasm of back: Secondary | ICD-10-CM | POA: Diagnosis not present

## 2021-02-09 DIAGNOSIS — M9903 Segmental and somatic dysfunction of lumbar region: Secondary | ICD-10-CM | POA: Diagnosis not present

## 2021-02-09 DIAGNOSIS — M5127 Other intervertebral disc displacement, lumbosacral region: Secondary | ICD-10-CM | POA: Diagnosis not present

## 2021-02-10 ENCOUNTER — Other Ambulatory Visit: Payer: Self-pay

## 2021-02-10 ENCOUNTER — Telehealth: Payer: Self-pay

## 2021-02-10 DIAGNOSIS — M545 Low back pain, unspecified: Secondary | ICD-10-CM

## 2021-02-10 NOTE — Progress Notes (Signed)
Ref entered for ortho

## 2021-02-10 NOTE — Telephone Encounter (Signed)
Copied from [place] [phone]. Topic: General - Inquiry >> Feb 10, 2021 10:04 AM Loma Boston wrote: Reason for CRM: Pt has called and wants Baxter Flattery to give Dr Lenna Sciara message. Her Chiropractor, Dr Milagros Reap has requested that she have Dr Lenna Sciara order an MRI, pt states Dr Ronnald Ramp aware of everything FU with pt at [phone]

## 2021-02-23 DIAGNOSIS — M5136 Other intervertebral disc degeneration, lumbar region: Secondary | ICD-10-CM | POA: Diagnosis not present

## 2021-02-23 DIAGNOSIS — M47816 Spondylosis without myelopathy or radiculopathy, lumbar region: Secondary | ICD-10-CM | POA: Diagnosis not present

## 2021-02-23 DIAGNOSIS — M545 Low back pain, unspecified: Secondary | ICD-10-CM | POA: Diagnosis not present

## 2021-02-23 DIAGNOSIS — M4316 Spondylolisthesis, lumbar region: Secondary | ICD-10-CM | POA: Diagnosis not present

## 2021-02-24 ENCOUNTER — Other Ambulatory Visit (HOSPITAL_COMMUNITY): Payer: Self-pay | Admitting: Sports Medicine

## 2021-02-24 ENCOUNTER — Other Ambulatory Visit: Payer: Self-pay | Admitting: Sports Medicine

## 2021-02-24 DIAGNOSIS — M545 Low back pain, unspecified: Secondary | ICD-10-CM

## 2021-02-24 DIAGNOSIS — M5136 Other intervertebral disc degeneration, lumbar region: Secondary | ICD-10-CM

## 2021-02-24 DIAGNOSIS — M4316 Spondylolisthesis, lumbar region: Secondary | ICD-10-CM

## 2021-02-24 DIAGNOSIS — M47816 Spondylosis without myelopathy or radiculopathy, lumbar region: Secondary | ICD-10-CM

## 2021-02-24 DIAGNOSIS — M51369 Other intervertebral disc degeneration, lumbar region without mention of lumbar back pain or lower extremity pain: Secondary | ICD-10-CM

## 2021-03-06 ENCOUNTER — Ambulatory Visit
Admission: RE | Admit: 2021-03-06 | Discharge: 2021-03-06 | Disposition: A | Discharge status: Home / Self Care | Payer: Medicare Other | Source: Ambulatory Visit | Attending: Sports Medicine | Admitting: Sports Medicine

## 2021-03-06 DIAGNOSIS — M48061 Spinal stenosis, lumbar region without neurogenic claudication: Secondary | ICD-10-CM | POA: Diagnosis not present

## 2021-03-06 DIAGNOSIS — M47816 Spondylosis without myelopathy or radiculopathy, lumbar region: Secondary | ICD-10-CM | POA: Insufficient documentation

## 2021-03-06 DIAGNOSIS — M5136 Other intervertebral disc degeneration, lumbar region: Secondary | ICD-10-CM | POA: Diagnosis not present

## 2021-03-06 DIAGNOSIS — Y939 Activity, unspecified: Secondary | ICD-10-CM | POA: Insufficient documentation

## 2021-03-06 DIAGNOSIS — Y929 Unspecified place or not applicable: Secondary | ICD-10-CM | POA: Diagnosis not present

## 2021-03-06 DIAGNOSIS — M4316 Spondylolisthesis, lumbar region: Secondary | ICD-10-CM | POA: Diagnosis not present

## 2021-03-06 DIAGNOSIS — X58XXXA Exposure to other specified factors, initial encounter: Secondary | ICD-10-CM | POA: Insufficient documentation

## 2021-03-06 DIAGNOSIS — M545 Low back pain, unspecified: Secondary | ICD-10-CM | POA: Diagnosis not present

## 2021-03-10 DIAGNOSIS — Z01411 Encounter for gynecological examination (general) (routine) with abnormal findings: Secondary | ICD-10-CM | POA: Diagnosis not present

## 2021-03-10 DIAGNOSIS — D1801 Hemangioma of skin and subcutaneous tissue: Secondary | ICD-10-CM | POA: Diagnosis not present

## 2021-03-10 DIAGNOSIS — Z1331 Encounter for screening for depression: Secondary | ICD-10-CM | POA: Diagnosis not present

## 2021-03-11 DIAGNOSIS — D485 Neoplasm of uncertain behavior of skin: Secondary | ICD-10-CM | POA: Diagnosis not present

## 2021-03-11 DIAGNOSIS — D2271 Melanocytic nevi of right lower limb, including hip: Secondary | ICD-10-CM | POA: Diagnosis not present

## 2021-03-11 DIAGNOSIS — D2272 Melanocytic nevi of left lower limb, including hip: Secondary | ICD-10-CM | POA: Diagnosis not present

## 2021-03-11 DIAGNOSIS — D0472 Carcinoma in situ of skin of left lower limb, including hip: Secondary | ICD-10-CM | POA: Diagnosis not present

## 2021-03-11 DIAGNOSIS — D225 Melanocytic nevi of trunk: Secondary | ICD-10-CM | POA: Diagnosis not present

## 2021-03-11 DIAGNOSIS — L821 Other seborrheic keratosis: Secondary | ICD-10-CM | POA: Diagnosis not present

## 2021-03-11 DIAGNOSIS — D0471 Carcinoma in situ of skin of right lower limb, including hip: Secondary | ICD-10-CM | POA: Diagnosis not present

## 2021-03-11 DIAGNOSIS — D2262 Melanocytic nevi of left upper limb, including shoulder: Secondary | ICD-10-CM | POA: Diagnosis not present

## 2021-03-11 DIAGNOSIS — D2261 Melanocytic nevi of right upper limb, including shoulder: Secondary | ICD-10-CM | POA: Diagnosis not present

## 2021-03-11 DIAGNOSIS — D0339 Melanoma in situ of other parts of face: Secondary | ICD-10-CM | POA: Diagnosis not present

## 2021-03-12 ENCOUNTER — Other Ambulatory Visit: Payer: Self-pay | Admitting: Obstetrics and Gynecology

## 2021-03-12 DIAGNOSIS — Z1239 Encounter for other screening for malignant neoplasm of breast: Secondary | ICD-10-CM

## 2021-03-24 DIAGNOSIS — M5136 Other intervertebral disc degeneration, lumbar region: Secondary | ICD-10-CM | POA: Diagnosis not present

## 2021-03-24 DIAGNOSIS — M47816 Spondylosis without myelopathy or radiculopathy, lumbar region: Secondary | ICD-10-CM | POA: Diagnosis not present

## 2021-03-25 ENCOUNTER — Other Ambulatory Visit: Payer: Self-pay

## 2021-03-25 ENCOUNTER — Ambulatory Visit
Admission: RE | Admit: 2021-03-25 | Discharge: 2021-03-25 | Disposition: A | Discharge status: Home / Self Care | Payer: Medicare Other | Source: Ambulatory Visit | Attending: Obstetrics and Gynecology | Admitting: Obstetrics and Gynecology

## 2021-03-25 DIAGNOSIS — Z1239 Encounter for other screening for malignant neoplasm of breast: Secondary | ICD-10-CM | POA: Diagnosis not present

## 2021-03-25 LAB — HM MAMMOGRAPHY

## 2021-03-25 MED ORDER — GADOBUTROL 1 MMOL/ML IV SOLN
4.0000 mL | Freq: Once | INTRAVENOUS | Status: AC | PRN
  Administered 2021-03-25: 11:00:00 4 mL via INTRAVENOUS

## 2021-03-26 DIAGNOSIS — F411 Generalized anxiety disorder: Secondary | ICD-10-CM | POA: Diagnosis not present

## 2021-03-26 DIAGNOSIS — M816 Localized osteoporosis [Lequesne]: Secondary | ICD-10-CM | POA: Diagnosis not present

## 2021-03-26 DIAGNOSIS — F5105 Insomnia due to other mental disorder: Secondary | ICD-10-CM | POA: Diagnosis not present

## 2021-04-15 DIAGNOSIS — M818 Other osteoporosis without current pathological fracture: Secondary | ICD-10-CM | POA: Diagnosis not present

## 2021-04-15 DIAGNOSIS — D181 Lymphangioma, any site: Secondary | ICD-10-CM | POA: Diagnosis not present

## 2021-04-15 DIAGNOSIS — D1801 Hemangioma of skin and subcutaneous tissue: Secondary | ICD-10-CM | POA: Diagnosis not present

## 2021-04-17 DIAGNOSIS — M5416 Radiculopathy, lumbar region: Secondary | ICD-10-CM | POA: Diagnosis not present

## 2021-04-17 DIAGNOSIS — M48062 Spinal stenosis, lumbar region with neurogenic claudication: Secondary | ICD-10-CM | POA: Diagnosis not present

## 2021-05-01 DIAGNOSIS — D0471 Carcinoma in situ of skin of right lower limb, including hip: Secondary | ICD-10-CM | POA: Diagnosis not present

## 2021-05-01 DIAGNOSIS — D0472 Carcinoma in situ of skin of left lower limb, including hip: Secondary | ICD-10-CM | POA: Diagnosis not present

## 2021-05-08 DIAGNOSIS — M47816 Spondylosis without myelopathy or radiculopathy, lumbar region: Secondary | ICD-10-CM | POA: Diagnosis not present

## 2021-05-08 DIAGNOSIS — M48062 Spinal stenosis, lumbar region with neurogenic claudication: Secondary | ICD-10-CM | POA: Diagnosis not present

## 2021-05-08 DIAGNOSIS — M5416 Radiculopathy, lumbar region: Secondary | ICD-10-CM | POA: Diagnosis not present

## 2021-05-08 DIAGNOSIS — M5136 Other intervertebral disc degeneration, lumbar region: Secondary | ICD-10-CM | POA: Diagnosis not present

## 2021-05-13 DIAGNOSIS — L988 Other specified disorders of the skin and subcutaneous tissue: Secondary | ICD-10-CM | POA: Diagnosis not present

## 2021-05-13 DIAGNOSIS — L578 Other skin changes due to chronic exposure to nonionizing radiation: Secondary | ICD-10-CM | POA: Diagnosis not present

## 2021-05-13 DIAGNOSIS — L814 Other melanin hyperpigmentation: Secondary | ICD-10-CM | POA: Diagnosis not present

## 2021-05-13 DIAGNOSIS — D0339 Melanoma in situ of other parts of face: Secondary | ICD-10-CM | POA: Diagnosis not present

## 2021-06-22 DIAGNOSIS — F411 Generalized anxiety disorder: Secondary | ICD-10-CM | POA: Diagnosis not present

## 2021-06-22 DIAGNOSIS — F5105 Insomnia due to other mental disorder: Secondary | ICD-10-CM | POA: Diagnosis not present

## 2021-07-22 ENCOUNTER — Encounter: Payer: Self-pay | Admitting: Family Medicine

## 2021-07-22 ENCOUNTER — Ambulatory Visit (INDEPENDENT_AMBULATORY_CARE_PROVIDER_SITE_OTHER): Payer: Medicare Other | Admitting: Family Medicine

## 2021-07-22 VITALS — BP 118/70 | HR 80 | Ht 63.0 in | Wt 106.0 lb

## 2021-07-22 DIAGNOSIS — R5383 Other fatigue: Secondary | ICD-10-CM | POA: Diagnosis not present

## 2021-07-22 DIAGNOSIS — E782 Mixed hyperlipidemia: Secondary | ICD-10-CM | POA: Diagnosis not present

## 2021-07-22 DIAGNOSIS — F324 Major depressive disorder, single episode, in partial remission: Secondary | ICD-10-CM

## 2021-07-22 MED ORDER — SERTRALINE HCL 25 MG PO TABS: 25.0000 mg | ORAL_TABLET | ORAL | 1 refills | Status: DC

## 2021-07-22 MED ORDER — TRAZODONE HCL 50 MG PO TABS: 75.0000 mg | ORAL_TABLET | Freq: Every day | ORAL | 1 refills | Status: DC

## 2021-07-22 MED ORDER — ROSUVASTATIN CALCIUM 20 MG PO TABS: 20.0000 mg | ORAL_TABLET | Freq: Every day | ORAL | 1 refills | Status: DC

## 2021-07-22 NOTE — Progress Notes (Signed)
? ? ?Date:  07/22/2021  ? ?Name:  Amanda Osborn   DOB:  March 16, 1956   MRN:  [id number] ? ? ?Chief Complaint: Hyperlipidemia, Depression, and Insomnia ? ?Hyperlipidemia ?The current episode started more than 1 year ago. The problem is controlled. Recent lipid tests were reviewed and are normal. She has no history of chronic renal disease, diabetes, hypothyroidism, liver disease, obesity or nephrotic syndrome. Pertinent negatives include no chest pain or myalgias. Current antihyperlipidemic treatment includes statins. The current treatment provides moderate improvement of lipids. There are no compliance problems.  Risk factors for coronary artery disease include dyslipidemia.  ?Depression ?       This is a chronic problem.  The current episode started more than 1 year ago.   The onset quality is gradual.   The problem occurs intermittently.  Associated symptoms include fatigue and insomnia.  Associated symptoms include no decreased concentration, no helplessness, no hopelessness, not irritable, no restlessness, no decreased interest, no appetite change, no body aches, no myalgias, no headaches, no indigestion, not sad and no suicidal ideas.  Past treatments include SSRIs - Selective serotonin reuptake inhibitors.  Past medical history includes thyroid problem.     Pertinent negatives include no hypothyroidism. ?Insomnia ?Primary symptoms: no somnolence, malaise/fatigue.   ?The current episode started more than one year. The problem occurs intermittently. PMH includes: depression.   ?Thyroid Problem ?Presents for initial visit. Symptoms include constipation, fatigue, hair loss and weight gain. Patient reports no cold intolerance, diaphoresis, diarrhea, dry skin, heat intolerance, menstrual problem, nail problem, palpitations or weight loss. Her past medical history is significant for hyperlipidemia. There is no history of diabetes.  ? ?Lab Results  ?Component Value Date  ? NA 143 08/08/2020  ? K 4.4 08/08/2020  ? CO2  27 08/08/2020  ? GLUCOSE 94 08/08/2020  ? BUN 26 08/08/2020  ? CREATININE 0.91 08/08/2020  ? CALCIUM 9.7 08/08/2020  ? EGFR 70 08/08/2020  ? GFRNONAA >60 09/13/2019  ? ?Lab Results  ?Component Value Date  ? CHOL 141 08/08/2020  ? HDL 69 08/08/2020  ? [place] 60 08/08/2020  ? TRIG 57 08/08/2020  ? CHOLHDL 2.1 05/17/2018  ? ?Lab Results  ?Component Value Date  ? TSH 1.430 05/17/2018  ? ?Lab Results  ?Component Value Date  ? HGBA1C 5.3 12/29/2017  ? ?Lab Results  ?Component Value Date  ? WBC 4.8 09/13/2019  ? HGB 12.4 09/13/2019  ? HCT 36.4 09/13/2019  ? MCV 95.3 09/13/2019  ? PLT 175 09/13/2019  ? ?Lab Results  ?Component Value Date  ? ALT 30 08/08/2020  ? AST 31 08/08/2020  ? ALKPHOS 48 08/08/2020  ? BILITOT 0.4 08/08/2020  ? ?No results found for: 25OHVITD2, [place], VD25OH  ? ?Review of Systems  ?Constitutional:  Positive for fatigue, malaise/fatigue and weight gain. Negative for appetite change, diaphoresis, unexpected weight change and weight loss.  ?Respiratory:  Negative for wheezing.   ?Cardiovascular:  Negative for chest pain and palpitations.  ?Gastrointestinal:  Positive for constipation. Negative for diarrhea.  ?Endocrine: Negative for cold intolerance and heat intolerance.  ?Genitourinary:  Negative for menstrual problem.  ?Musculoskeletal:  Negative for myalgias.  ?Neurological:  Negative for headaches.  ?Psychiatric/Behavioral:  Positive for depression. Negative for decreased concentration and suicidal ideas. The patient has insomnia.   ? ?Patient Active Problem List  ? Diagnosis Date Noted  ? Pelvic pain in female 08/30/2019  ? Dizziness 03/21/2019  ? CVA (cerebral vascular accident) ([place]) 12/29/2017  ? Slurred speech 12/28/2017  ?  Malignant neoplastic disease ([place]) 03/18/2015  ? Chondromalacia of patella 09/13/2014  ? Bursitis 09/13/2014  ? Allergic rhinitis 09/13/2014  ? Cancer ([place]) 09/13/2014  ? Cervical disc disease 09/13/2014  ? ? ?Allergies  ?Allergen Reactions  ? Montelukast Sodium   ?   Caused headaches  ? ? ?Past Surgical History:  ?Procedure Laterality Date  ? AUGMENTATION MAMMAPLASTY Bilateral 2011  ? BREAST CYST ASPIRATION Bilateral   ? BREAST EXCISIONAL BIOPSY Left 2006  ? breast ca  ? BREAST LUMPECTOMY    ? CARDIAC CATHETERIZATION    ? COLONOSCOPY    ? COLONOSCOPY WITH PROPOFOL N/A 06/28/2016  ? Procedure: COLONOSCOPY WITH PROPOFOL;  Surgeon: Lollie Sails, MD;  Location: [facility] ENDOSCOPY;  Service: Endoscopy;  Laterality: N/A;  ? CYSTOSCOPY W/ URETERAL STENT PLACEMENT Left 09/21/2019  ? Procedure: CYSTOSCOPY WITH RETROGRADE PYELOGRAM/URETERAL STENT PLACEMENT;  Surgeon: Billey Co, MD;  Location: [facility] ORS;  Service: Urology;  Laterality: Left;  ? LYMPH NODE BIOPSY    ? NECK  ? MASTECTOMY Bilateral   ? TEE WITHOUT CARDIOVERSION N/A 12/30/2017  ? Procedure: TRANSESOPHAGEAL ECHOCARDIOGRAM (TEE);  Surgeon: Teodoro Spray, MD;  Location: [facility] ORS;  Service: Cardiovascular;  Laterality: N/A;  ? TOTAL VAGINAL HYSTERECTOMY    ? URETEROSCOPY Left 09/21/2019  ? Procedure: URETEROSCOPY-DIAGNOSTIC;  Surgeon: Billey Co, MD;  Location: [facility] ORS;  Service: Urology;  Laterality: Left;  ? VAGINAL HYSTERECTOMY    ? ? ?Social History  ? ?Tobacco Use  ? Smoking status: Never  ? Smokeless tobacco: Never  ?Vaping Use  ? Vaping Use: Never used  ?Substance Use Topics  ? Alcohol use: No  ?  Alcohol/week: 0.0 standard drinks  ? Drug use: No  ? ? ? ?Medication list has been reviewed and updated. ? ?Current Meds  ?Medication Sig  ? acyclovir (ZOVIRAX) 400 MG tablet Take 400 mg by mouth 2 (two) times daily as needed (fever blister).   ? aspirin EC 325 MG EC tablet Take 1 tablet (325 mg total) by mouth daily.  ? buPROPion (WELLBUTRIN SR) 100 MG 12 hr tablet Take 100 mg by mouth every morning.   ? Cholecalciferol (VITAMIN D-3) 125 MCG (5000 UT) TABS Take 5,000 Units by mouth daily.   ? Multiple Vitamin (MULTIVITAMIN WITH MINERALS) TABS tablet Take 1 tablet by mouth daily. Alive Multivitamin  ? rosuvastatin  (CRESTOR) 20 MG tablet Take 1 tablet (20 mg total) by mouth at bedtime.  ? sertraline (ZOLOFT) 25 MG tablet Take 1 tablet (25 mg total) by mouth every morning.  ? traZODone (DESYREL) 50 MG tablet Take 1.5 tablets (75 mg total) by mouth at bedtime.  ? ? ? ?  07/22/2021  ? 11:12 AM 01/21/2021  ? 10:18 AM 12/29/2020  ?  1:25 PM 08/08/2020  ? 10:08 AM  ?GAD 7 : Generalized Anxiety Score  ?Nervous, Anxious, on Edge 0 0 0 0  ?Control/stop worrying 0 0 0 0  ?Worry too much - different things 0 0 0 0  ?Trouble relaxing 0 0 0 0  ?Restless 0 0 0 0  ?Easily annoyed or irritable 0 0 0 0  ?Afraid - awful might happen 0 0 0 0  ?Total GAD 7 Score 0 0 0 0  ?Anxiety Difficulty Not difficult at all     ? ? ? ?  07/22/2021  ? 11:10 AM  ?Depression screen PHQ 2/9  ?Decreased Interest 0  ?Down, Depressed, Hopeless 0  ?PHQ - 2 Score 0  ?Altered sleeping 0  ?  Tired, decreased energy 0  ?Change in appetite 0  ?Feeling bad or failure about yourself  0  ?Trouble concentrating 0  ?Moving slowly or fidgety/restless 0  ?Suicidal thoughts 0  ?PHQ-9 Score 0  ?Difficult doing work/chores Not difficult at all  ? ? ?BP Readings from Last 3 Encounters:  ?07/22/21 118/70  ?01/21/21 100/64  ?12/29/20 110/62  ? ? ?Physical Exam ?Vitals and nursing note reviewed. Exam conducted with a chaperone present.  ?Constitutional:   ?   General: She is not irritable.She is not in acute distress. ?   Appearance: She is not diaphoretic.  ?HENT:  ?   Head: Normocephalic and atraumatic.  ?   Right Ear: Tympanic membrane and external ear normal.  ?   Left Ear: Tympanic membrane and external ear normal.  ?   Nose: Nose normal. No congestion or rhinorrhea.  ?Eyes:  ?   General:     ?   Right eye: No discharge.     ?   Left eye: No discharge.  ?   Conjunctiva/sclera: Conjunctivae normal.  ?   Pupils: Pupils are equal, round, and reactive to light.  ?Neck:  ?   Thyroid: No thyromegaly.  ?   Vascular: No JVD.  ?Cardiovascular:  ?   Rate and Rhythm: Normal rate and regular  rhythm.  ?   Heart sounds: Normal heart sounds. No murmur heard. ?  No friction rub. No gallop.  ?Pulmonary:  ?   Effort: Pulmonary effort is normal.  ?   Breath sounds: Normal breath sounds. No wheezing, rhonc

## 2021-07-23 LAB — COMPREHENSIVE METABOLIC PANEL
ALT: 30 IU/L (ref 0–32)
AST: 26 IU/L (ref 0–40)
Albumin/Globulin Ratio: 3.6 — ABNORMAL HIGH (ref 1.2–2.2)
Albumin: 5 g/dL — ABNORMAL HIGH (ref 3.8–4.8)
Alkaline Phosphatase: 52 IU/L (ref 44–121)
BUN/Creatinine Ratio: 31 — ABNORMAL HIGH (ref 12–28)
BUN: 24 mg/dL (ref 8–27)
Bilirubin Total: 0.4 mg/dL (ref 0.0–1.2)
CO2: 25 mmol/L (ref 20–29)
Calcium: 9.8 mg/dL (ref 8.7–10.3)
Chloride: 105 mmol/L (ref 96–106)
Creatinine, Ser: 0.77 mg/dL (ref 0.57–1.00)
Globulin, Total: 1.4 g/dL — ABNORMAL LOW (ref 1.5–4.5)
Glucose: 87 mg/dL (ref 70–99)
Potassium: 4.5 mmol/L (ref 3.5–5.2)
Sodium: 143 mmol/L (ref 134–144)
Total Protein: 6.4 g/dL (ref 6.0–8.5)
eGFR: 84 mL/min/{1.73_m2} (ref >59)

## 2021-07-23 LAB — LIPID PANEL WITH LDL/HDL RATIO
Cholesterol, Total: 146 mg/dL (ref 100–199)
HDL: 68 mg/dL (ref >39)
LDL Chol Calc (NIH): 68 mg/dL (ref 0–99)
LDL/HDL Ratio: 1 ratio (ref 0.0–3.2)
Triglycerides: 43 mg/dL (ref 0–149)
VLDL Cholesterol Cal: 10 mg/dL (ref 5–40)

## 2021-07-23 LAB — CBC WITH DIFFERENTIAL/PLATELET
Basophils Absolute: 0 10*3/uL (ref 0.0–0.2)
Basos: 0 %
EOS (ABSOLUTE): 0.1 10*3/uL (ref 0.0–0.4)
Eos: 2 %
Hematocrit: 37.6 % (ref 34.0–46.6)
Hemoglobin: 13 g/dL (ref 11.1–15.9)
Immature Grans (Abs): 0 10*3/uL (ref 0.0–0.1)
Immature Granulocytes: 0 %
Lymphocytes Absolute: 1.8 10*3/uL (ref 0.7–3.1)
Lymphs: 39 %
MCH: 32.1 pg (ref 26.6–33.0)
MCHC: 34.6 g/dL (ref 31.5–35.7)
MCV: 93 fL (ref 79–97)
Monocytes Absolute: 0.4 10*3/uL (ref 0.1–0.9)
Monocytes: 8 %
Neutrophils Absolute: 2.3 10*3/uL (ref 1.4–7.0)
Neutrophils: 51 %
Platelets: 166 10*3/uL (ref 150–450)
RBC: 4.05 x10E6/uL (ref 3.77–5.28)
RDW: 12.3 % (ref 11.7–15.4)
WBC: 4.6 10*3/uL (ref 3.4–10.8)

## 2021-07-23 LAB — TSH: TSH: 1.67 u[IU]/mL (ref 0.450–4.500)

## 2021-09-17 DIAGNOSIS — F411 Generalized anxiety disorder: Secondary | ICD-10-CM | POA: Diagnosis not present

## 2021-09-17 DIAGNOSIS — F5105 Insomnia due to other mental disorder: Secondary | ICD-10-CM | POA: Diagnosis not present

## 2021-10-30 DIAGNOSIS — D2261 Melanocytic nevi of right upper limb, including shoulder: Secondary | ICD-10-CM | POA: Diagnosis not present

## 2021-10-30 DIAGNOSIS — D225 Melanocytic nevi of trunk: Secondary | ICD-10-CM | POA: Diagnosis not present

## 2021-10-30 DIAGNOSIS — D2272 Melanocytic nevi of left lower limb, including hip: Secondary | ICD-10-CM | POA: Diagnosis not present

## 2021-10-30 DIAGNOSIS — D2262 Melanocytic nevi of left upper limb, including shoulder: Secondary | ICD-10-CM | POA: Diagnosis not present

## 2021-10-30 DIAGNOSIS — Z85828 Personal history of other malignant neoplasm of skin: Secondary | ICD-10-CM | POA: Diagnosis not present

## 2021-12-21 DIAGNOSIS — F5105 Insomnia due to other mental disorder: Secondary | ICD-10-CM | POA: Diagnosis not present

## 2021-12-21 DIAGNOSIS — F411 Generalized anxiety disorder: Secondary | ICD-10-CM | POA: Diagnosis not present

## 2022-01-11 ENCOUNTER — Ambulatory Visit: Payer: Medicare Other

## 2022-01-15 ENCOUNTER — Ambulatory Visit (INDEPENDENT_AMBULATORY_CARE_PROVIDER_SITE_OTHER): Payer: Medicare Other

## 2022-01-15 DIAGNOSIS — Z0000 Encounter for general adult medical examination without abnormal findings: Secondary | ICD-10-CM | POA: Diagnosis not present

## 2022-01-15 NOTE — Patient Instructions (Signed)

## 2022-01-15 NOTE — Progress Notes (Signed)
I connected with  Amanda Osborn on 01/15/22 by a audio enabled telemedicine application and verified that I am speaking with the correct person using two identifiers.  Patient Location: Home  Provider Location: Office/Clinic  I discussed the limitations of evaluation and management by telemedicine. The patient expressed understanding and agreed to proceed.  Subjective:   Amanda Osborn is a 66 y.o. female who presents for Medicare Annual (Subsequent) preventive examination.  Review of Systems    Per HPI unless specifically indicated below.  Cardiac Risk Factors include: advanced age (>39mn, >>69women);female gender          Objective:       07/22/2021   11:02 AM 01/21/2021   10:14 AM 12/29/2020    1:22 PM  Vitals with BMI  Height '5\' 3"'$  '5\' 3"'$  '5\' 3"'$   Weight 106 lbs 104 lbs 104 lbs  BMI 18.78 [id number] Systolic [id number] Diastolic 70 64 62  Pulse 80 68 80    There were no vitals filed for this visit. There is no height or weight on file to calculate BMI.     01/07/2021    2:54 PM 01/07/2020    3:01 PM 09/21/2019    7:47 AM 09/13/2019   10:49 AM 12/28/2017    5:36 PM 12/28/2017    3:11 PM 06/28/2016    7:17 AM  Advanced Directives  Does Patient Have a Medical Advance Directive? Yes Yes Yes No Yes No No  Type of Advance Directive Living [place] Living will  Living will    Does patient want to make changes to medical advance directive?     No - Patient declined    Copy of [place] Chart?  No - copy requested         Current Medications (verified) Outpatient Encounter Medications as of 01/15/2022  Medication Sig   acyclovir (ZOVIRAX) 400 MG tablet Take 400 mg by mouth 2 (two) times daily as needed (fever blister).    aspirin EC 325 MG EC tablet Take 1 tablet (325 mg total) by mouth daily.   buPROPion (WELLBUTRIN SR) 100 MG 12 hr tablet Take 100 mg by mouth every morning.    Cholecalciferol  (VITAMIN D-3) 125 MCG (5000 UT) TABS Take 5,000 Units by mouth daily.    Multiple Vitamin (MULTIVITAMIN WITH MINERALS) TABS tablet Take 1 tablet by mouth daily. Alive Multivitamin   rosuvastatin (CRESTOR) 20 MG tablet Take 1 tablet (20 mg total) by mouth at bedtime.   sertraline (ZOLOFT) 25 MG tablet Take 1 tablet (25 mg total) by mouth every morning.   traZODone (DESYREL) 50 MG tablet Take 1.5 tablets (75 mg total) by mouth at bedtime.   [DISCONTINUED] cyclobenzaprine (FLEXERIL) 5 MG tablet Take 1 tablet (5 mg total) by mouth 3 (three) times daily as needed for muscle spasms. (Patient not taking: Reported on 01/15/2022)   [DISCONTINUED] meloxicam (MOBIC) 7.5 MG tablet Take 1 tablet (7.5 mg total) by mouth daily. (Patient not taking: Reported on 07/22/2021)   No facility-administered encounter medications on file as of 01/15/2022.    Allergies (verified) Montelukast sodium   History: Past Medical History:  Diagnosis Date   Allergy    Anxiety    Arthritis    [place]   BIL BREASTS-LOBULAR CARCINOMA IN SITU   Dizziness    Headache    History of CVA (cerebrovascular accident)    Hyperlipidemia  Osteoporosis    Stroke ([place]) 2019   NO RESIDUAL EFFECTS   Past Surgical History:  Procedure Laterality Date   AUGMENTATION MAMMAPLASTY Bilateral 2011   BREAST CYST ASPIRATION Bilateral    BREAST EXCISIONAL BIOPSY Left 2006   breast ca   BREAST LUMPECTOMY     CARDIAC CATHETERIZATION     COLONOSCOPY     COLONOSCOPY WITH PROPOFOL N/A 06/28/2016   Procedure: COLONOSCOPY WITH PROPOFOL;  Surgeon: Lollie Sails, MD;  Location[facility] ENDOSCOPY;  Service: Endoscopy;  Laterality: N/A;   CYSTOSCOPY W/ URETERAL STENT PLACEMENT Left 09/21/2019   Procedure: CYSTOSCOPY WITH RETROGRADE PYELOGRAM/URETERAL STENT PLACEMENT;  Surgeon: Billey Co, MD;  Location: [facility] ORS;  Service: Urology;  Laterality: Left;   LYMPH NODE BIOPSY     NECK   MASTECTOMY Bilateral    TEE WITHOUT  CARDIOVERSION N/A 12/30/2017   Procedure: TRANSESOPHAGEAL ECHOCARDIOGRAM (TEE);  Surgeon: Teodoro Spray, MD;  Location: [facility] ORS;  Service: Cardiovascular;  Laterality: N/A;   TOTAL VAGINAL HYSTERECTOMY     URETEROSCOPY Left 09/21/2019   Procedure: URETEROSCOPY-DIAGNOSTIC;  Surgeon: Billey Co, MD;  Location: [facility] ORS;  Service: Urology;  Laterality: Left;   VAGINAL HYSTERECTOMY     Family History  Problem Relation Age of Onset   Breast cancer Paternal 39    Breast cancer Paternal Grandmother    Dementia Mother        passed at age 78   Leukemia Father        passed at age 83   Leukemia Brother    Social History   Socioeconomic History   Marital status: Married    Spouse name: Hosanna Betley   Number of children: 2   Years of education: college   Highest education level: Bachelor's degree (e.g., BA, AB, BS)  Occupational History   Occupation: retired from Data processing manager work  Tobacco Use   Smoking status: Never   Smokeless tobacco: Never  Vaping Use   Vaping Use: Never used  Substance and Sexual Activity   Alcohol use: No    Alcohol/week: 0.0 standard drinks of alcohol   Drug use: No   Sexual activity: Yes  Other Topics Concern   Not on file  Social History Narrative   Lives at home with her husband, [name].   Right-handed.   No caffeine use.   Social Determinants of Health   Financial Resource Strain: Low Risk  (01/15/2022)   Overall Financial Resource Strain (CARDIA)    Difficulty of Paying Living Expenses: Not hard at all  Food Insecurity: No Food Insecurity (01/15/2022)   Hunger Vital Sign    Worried About Running Out of Food in the Last Year: Never true    Ran Out of Food in the Last Year: Never true  Transportation Needs: No Transportation Needs (01/15/2022)   PRAPARE - Hydrologist (Medical): No    Lack of Transportation (Non-Medical): No  Physical Activity: Insufficiently Active (01/15/2022)   Exercise Vital Sign     Days of Exercise per Week: 2 days    Minutes of Exercise per Session: 30 min  Stress: No Stress Concern Present (01/15/2022)   [place]    Feeling of Stress : Only a little  Social Connections: Socially Integrated (01/15/2022)   Social Connection and Isolation Panel [NHANES]    Frequency of Communication with Friends and Family: More than three times a week    Frequency of Social Gatherings  with Friends and Family: Twice a week    Attends Religious Services: More than 4 times per year    Active Member of Clubs or Organizations: Yes    Attends Music therapist: More than 4 times per year    Marital Status: Married    Tobacco Counseling Counseling given: No   Clinical Intake:  Pre-visit preparation completed: No  Pain : No/denies pain     Nutritional Status: BMI of 19-24  Normal Nutritional Risks: None Diabetes: No  How often do you need to have someone help you when you read instructions, pamphlets, or other written materials from your doctor or pharmacy?: 1 - Never  Diabetic?No  Interpreter Needed?: No  Information entered by :: Donnie Mesa, CMA   Activities of Daily Living    01/15/2022    3:35 PM  In your present state of health, do you have any difficulty performing the following activities:  Hearing? 0  Vision? 0  Difficulty concentrating or making decisions? 1  Walking or climbing stairs? 0  Dressing or bathing? 0  Doing errands, shopping? 0    Patient Care Team: Juline Patch, MD as PCP - General (Family Medicine) Schermerhorn, Gwen Her, MD as Referring Physician (Obstetrics and Gynecology)  Indicate any recent Medical Services you may have received from other than [facility] providers in the past year (date may be approximate).    No hospitalization in the past 12 months. Assessment:   This is a routine wellness examination for [place].  Hearing/Vision screen Denies any  hearing issues. Denies any vision issues. Annual Eye Exam, [facility].  Dietary issues and exercise activities discussed: Current Exercise Habits: Structured exercise class, Type of exercise: Other - see comments, Time (Minutes): 30, Frequency (Times/Week): 2, Weekly Exercise (Minutes/Week): 60, Intensity: Mild   Goals Addressed             This Visit's Progress    Stay Active and Independent       Why is this important?   Regular activity or exercise is important to managing back pain.  Activity helps to keep your muscles strong.  You will sleep better and feel more relaxed.  You will have more energy and feel less stressed.  If you are not active now, start slowly. Little changes make a big difference.  Rest, but not too much.  Stay as active as you can and listen to your body's signals.            Depression Screen    01/15/2022    3:34 PM 07/22/2021   11:10 AM 01/21/2021   10:18 AM 01/07/2021    2:54 PM 12/29/2020    1:24 PM 08/08/2020   10:08 AM 07/22/2020    2:08 PM  PHQ 2/9 Scores  PHQ - 2 Score 0 0 0 0 0 0 0  PHQ- 9 Score  0 0  0 0 0    Fall Risk    01/15/2022    3:35 PM 01/07/2021    2:55 PM 01/07/2020    3:02 PM 09/19/2019   10:15 AM 08/13/2019    3:24 PM  [place] in the past year? 0 0 0 0 0  Number falls in past yr: 0 0 0    Injury with Fall? 0 0 0    Risk for fall due to : No Fall Risks No Fall Risks No Fall Risks    Follow up Falls evaluation completed Falls prevention discussed Falls  prevention discussed Falls evaluation completed Falls evaluation completed    FALL RISK PREVENTION PERTAINING TO THE HOME:  Any stairs in or around the home? Yes  If so, are there any without handrails? No  Home free of loose throw rugs in walkways, pet beds, electrical cords, etc? Yes  Adequate lighting in your home to reduce risk of falls? Yes   ASSISTIVE DEVICES UTILIZED TO PREVENT FALLS:  Life alert? No  Use of a cane, walker or w/c? No  Grab  bars in the bathroom? No  Shower chair or bench in shower? Yes  Elevated toilet seat or a handicapped toilet? No   TIMED UP AND GO:  Was the test performed?  unable to perform, virtual appt.  .  Cognitive Function:        01/15/2022    3:35 PM 01/07/2020    3:03 PM  6CIT Screen  What Year? 0 points 0 points  What month? 0 points 0 points  What time? 0 points 0 points  Count back from 20 2 points 0 points  Months in reverse 0 points 0 points  Repeat phrase 2 points 0 points  Total Score 4 points 0 points    Immunizations Immunization History  Administered Date(s) Administered   Fluad Quad(high Dose 65+) 12/21/2018, 01/21/2021   Influenza,inj,Quad PF,6+ Mos 12/29/2017   Influenza-Unspecified 01/11/2016, 12/29/2017   PFIZER(Purple Top)SARS-COV-2 Vaccination 05/28/2019, 06/18/2019, 03/20/2020   PNEUMOCOCCAL CONJUGATE-20 08/08/2020   Pneumococcal Conjugate-13 12/28/2018    TDAP status: Due, Education has been provided regarding the importance of this vaccine. Advised may receive this vaccine at local pharmacy or Health Dept. Aware to provide a copy of the vaccination record if obtained from local pharmacy or Health Dept. Verbalized acceptance and understanding.  Flu Vaccine status: Due, Education has been provided regarding the importance of this vaccine. Advised may receive this vaccine at local pharmacy or Health Dept. Aware to provide a copy of the vaccination record if obtained from local pharmacy or Health Dept. Verbalized acceptance and understanding.  Pneumococcal vaccine status: Up to date  Covid-19 vaccine status: Declined, Education has been provided regarding the importance of this vaccine but patient still declined. Advised may receive this vaccine at local pharmacy or Health Dept.or vaccine clinic. Aware to provide a copy of the vaccination record if obtained from local pharmacy or Health Dept. Verbalized acceptance and understanding.  Qualifies for Shingles Vaccine?  Yes   Zostavax completed No   Shingrix Completed?: No.    Education has been provided regarding the importance of this vaccine. Patient has been advised to call insurance company to determine out of pocket expense if they have not yet received this vaccine. Advised may also receive vaccine at local pharmacy or Health Dept. Verbalized acceptance and understanding.  Screening Tests Health Maintenance  Topic Date Due   INFLUENZA VACCINE  04/10/2022 (Originally 11/10/2021)   Zoster Vaccines- Shingrix (1 of 2) 04/17/2022 (Originally 11/11/1974)   TETANUS/TDAP  01/16/2023 (Originally 11/11/1974)   Hepatitis C Screening  01/16/2023 (Originally 11/12/1973)   MAMMOGRAM  03/26/2023   COLONOSCOPY (Pts 45-67yr Insurance coverage will need to be confirmed)  06/29/2026   Pneumonia Vaccine 68 Years old  Completed   DEXA SCAN  Completed   HPV VACCINES  Aged Out   COVID-19 Vaccine  Discontinued    Health Maintenance  There are no preventive care reminders to display for this patient.   Colorectal cancer screening: Type of screening: Colonoscopy. Completed 06/28/2016. Repeat every 10 years  Mammogram status:  Completed 03/25/2021. Repeat every year  DEXa Scan: 03/07/2019  Lung Cancer Screening: (Low Dose CT Chest recommended if Age 44-80 years, 30 pack-year currently smoking OR have quit w/in 15years.) does not qualify.     Additional Screening:  Hepatitis C Screening: does qualify; postponed  Vision Screening: Recommended annual ophthalmology exams for early detection of glaucoma and other disorders of the eye. Is the patient up to date with their annual eye exam?  Yes  Who is the provider or what is the name of the office in which the patient attends annual eye exams? [facility]  If pt is not established with a provider, would they like to be referred to a provider to establish care? No .   Dental Screening: Recommended annual dental exams for proper oral hygiene  Community Resource  Referral / Chronic Care Management: CRR required this visit?  No   CCM required this visit?  No      Plan:     I have personally reviewed and noted the following in the patient's chart:   Medical and social history Use of alcohol, tobacco or illicit drugs  Current medications and supplements including opioid prescriptions. Patient is not currently taking opioid prescriptions. Functional ability and status Nutritional status Physical activity Advanced directives List of other physicians Hospitalizations, surgeries, and ER visits in previous 12 months Vitals Screenings to include cognitive, depression, and falls Referrals and appointments  In addition, I have reviewed and discussed with patient certain preventive protocols, quality metrics, and best practice recommendations. A written personalized care plan for preventive services as well as general preventive health recommendations were provided to patient.    Ms. Kirt , Thank you for taking time to come for your Medicare Wellness Visit. I appreciate your ongoing commitment to your health goals. Please review the following plan we discussed and let me know if I can assist you in the future.   These are the goals we discussed:  Goals      Increase physical activity     Recommend increasing physical activity to at least 3 days per week     Stay Active and Independent     Why is this important?   Regular activity or exercise is important to managing back pain.  Activity helps to keep your muscles strong.  You will sleep better and feel more relaxed.  You will have more energy and feel less stressed.  If you are not active now, start slowly. Little changes make a big difference.  Rest, but not too much.  Stay as active as you can and listen to your body's signals.             This is a list of the screening recommended for you and due dates:  Health Maintenance  Topic Date Due   Flu Shot  04/10/2022*   Zoster  (Shingles) Vaccine (1 of 2) 04/17/2022*   Tetanus Vaccine  01/16/2023*   Hepatitis C Screening: USPSTF Recommendation to screen - Ages 18-79 yo.  01/16/2023*   Mammogram  03/26/2023   Colon Cancer Screening  06/29/2026   Pneumonia Vaccine  Completed   DEXA scan (bone density measurement)  Completed   HPV Vaccine  Aged Out   COVID-19 Vaccine  Discontinued  *Topic was postponed. The date shown is not the original due date.      Wilson Singer, [place]   01/15/2022   Nurse Notes: Approximately 30 minute Non-Face-to-Face Visit

## 2022-01-26 ENCOUNTER — Ambulatory Visit (INDEPENDENT_AMBULATORY_CARE_PROVIDER_SITE_OTHER): Payer: Medicare Other | Admitting: Family Medicine

## 2022-01-26 ENCOUNTER — Encounter: Payer: Self-pay | Admitting: Family Medicine

## 2022-01-26 VITALS — BP 104/60 | HR 64 | Ht 63.0 in | Wt 108.0 lb

## 2022-01-26 DIAGNOSIS — F324 Major depressive disorder, single episode, in partial remission: Secondary | ICD-10-CM | POA: Diagnosis not present

## 2022-01-26 DIAGNOSIS — E782 Mixed hyperlipidemia: Secondary | ICD-10-CM

## 2022-01-26 DIAGNOSIS — F5101 Primary insomnia: Secondary | ICD-10-CM

## 2022-01-26 MED ORDER — TRAZODONE HCL 50 MG PO TABS: 75.0000 mg | ORAL_TABLET | Freq: Every day | ORAL | 1 refills | Status: DC

## 2022-01-26 MED ORDER — ROSUVASTATIN CALCIUM 20 MG PO TABS: 20.0000 mg | ORAL_TABLET | Freq: Every day | ORAL | 1 refills | Status: DC

## 2022-01-26 MED ORDER — SERTRALINE HCL 50 MG PO TABS: 50.0000 mg | ORAL_TABLET | Freq: Every day | ORAL | 0 refills | Status: DC

## 2022-01-26 NOTE — Progress Notes (Signed)
Date:  01/26/2022   Name:  Amanda Osborn   DOB:  12-24-1955   MRN:  [id number]   Chief Complaint: Hyperlipidemia, Depression, and Insomnia  Hyperlipidemia This is a chronic problem. The current episode started more than 1 year ago. The problem is controlled. Recent lipid tests were reviewed and are normal. She has no history of chronic renal disease, diabetes, hypothyroidism, liver disease, obesity or nephrotic syndrome. Pertinent negatives include no chest pain, focal sensory loss, focal weakness, leg pain, myalgias or shortness of breath. Current antihyperlipidemic treatment includes statins. The current treatment provides moderate improvement of lipids. There are no compliance problems.   Depression        This is a chronic problem.  The problem has been gradually improving since onset.  Associated symptoms include helplessness, hopelessness and insomnia.  Associated symptoms include no decreased concentration, no fatigue, not irritable, no restlessness, no decreased interest, no appetite change, no body aches, no myalgias, no headaches, no indigestion, not sad and no suicidal ideas.     The symptoms are aggravated by family issues.  Past treatments include SSRIs - Selective serotonin reuptake inhibitors.  Previous treatment provided mild relief.   Pertinent negatives include no hypothyroidism. Insomnia Primary symptoms: fragmented sleep, no difficulty falling asleep, premature morning awakening.   The problem occurs intermittently. The problem has been gradually improving since onset. The treatment provided moderate relief. PMH includes: depression.     Lab Results  Component Value Date   NA 143 07/22/2021   K 4.5 07/22/2021   CO2 25 07/22/2021   GLUCOSE 87 07/22/2021   BUN 24 07/22/2021   CREATININE 0.77 07/22/2021   CALCIUM 9.8 07/22/2021   EGFR 84 07/22/2021   GFRNONAA >60 09/13/2019   Lab Results  Component Value Date   CHOL 146 07/22/2021   HDL 68 07/22/2021    LDLCALC 68 07/22/2021   TRIG 43 07/22/2021   CHOLHDL 2.1 05/17/2018   Lab Results  Component Value Date   TSH 1.670 07/22/2021   Lab Results  Component Value Date   HGBA1C 5.3 12/29/2017   Lab Results  Component Value Date   WBC 4.6 07/22/2021   HGB 13.0 07/22/2021   HCT 37.6 07/22/2021   MCV 93 07/22/2021   PLT 166 07/22/2021   Lab Results  Component Value Date   ALT 30 07/22/2021   AST 26 07/22/2021   ALKPHOS 52 07/22/2021   BILITOT 0.4 07/22/2021   No results found for: "25OHVITD2", "25OHVITD3", "VD25OH"   Review of Systems  Constitutional:  Negative for appetite change, chills, fatigue and fever.  HENT:  Negative for drooling, ear discharge, ear pain and sore throat.   Respiratory:  Negative for cough, shortness of breath and wheezing.   Cardiovascular:  Negative for chest pain, palpitations and leg swelling.  Gastrointestinal:  Negative for abdominal pain, blood in stool, constipation, diarrhea and nausea.  Endocrine: Negative for polydipsia.  Genitourinary:  Negative for dysuria, frequency, hematuria and urgency.  Musculoskeletal:  Negative for back pain, myalgias and neck pain.  Skin:  Negative for rash.  Allergic/Immunologic: Negative for environmental allergies.  Neurological:  Negative for dizziness, focal weakness and headaches.  Hematological:  Does not bruise/bleed easily.  Psychiatric/Behavioral:  Positive for depression. Negative for decreased concentration and suicidal ideas. The patient has insomnia. The patient is not nervous/anxious.     Patient Active Problem List   Diagnosis Date Noted   Pelvic pain in female 08/30/2019   Dizziness 03/21/2019   CVA (  cerebral vascular accident) ([place]) 12/29/2017   Slurred speech 12/28/2017   Malignant neoplastic disease ([place]) 03/18/2015   Chondromalacia of patella 09/13/2014   Bursitis 09/13/2014   Allergic rhinitis 09/13/2014   Cancer ([place]) 09/13/2014   Cervical disc disease 09/13/2014    Allergies   Allergen Reactions   Montelukast Sodium     Caused headaches    Past Surgical History:  Procedure Laterality Date   AUGMENTATION MAMMAPLASTY Bilateral 2011   BREAST CYST ASPIRATION Bilateral    BREAST EXCISIONAL BIOPSY Left 2006   breast ca   BREAST LUMPECTOMY     CARDIAC CATHETERIZATION     COLONOSCOPY     COLONOSCOPY WITH PROPOFOL N/A 06/28/2016   Procedure: COLONOSCOPY WITH PROPOFOL;  Surgeon: Lollie Sails, MD;  Location: [facility] ENDOSCOPY;  Service: Endoscopy;  Laterality: N/A;   CYSTOSCOPY W/ URETERAL STENT PLACEMENT Left 09/21/2019   Procedure: CYSTOSCOPY WITH RETROGRADE PYELOGRAM/URETERAL STENT PLACEMENT;  Surgeon: Billey Co, MD;  Location: [facility] ORS;  Service: Urology;  Laterality: Left;   LYMPH NODE BIOPSY     NECK   MASTECTOMY Bilateral    TEE WITHOUT CARDIOVERSION N/A 12/30/2017   Procedure: TRANSESOPHAGEAL ECHOCARDIOGRAM (TEE);  Surgeon: Teodoro Spray, MD;  Location: [facility] ORS;  Service: Cardiovascular;  Laterality: N/A;   TOTAL VAGINAL HYSTERECTOMY     URETEROSCOPY Left 09/21/2019   Procedure: URETEROSCOPY-DIAGNOSTIC;  Surgeon: Billey Co, MD;  Location: [facility] ORS;  Service: Urology;  Laterality: Left;   VAGINAL HYSTERECTOMY      Social History   Tobacco Use   Smoking status: Never   Smokeless tobacco: Never  Vaping Use   Vaping Use: Never used  Substance Use Topics   Alcohol use: No    Alcohol/week: 0.0 standard drinks of alcohol   Drug use: No     Medication list has been reviewed and updated.  Current Meds  Medication Sig   acyclovir (ZOVIRAX) 400 MG tablet Take 400 mg by mouth 2 (two) times daily as needed (fever blister).    aspirin EC 325 MG EC tablet Take 1 tablet (325 mg total) by mouth daily.   buPROPion (WELLBUTRIN SR) 100 MG 12 hr tablet Take 100 mg by mouth every morning.    Cholecalciferol (VITAMIN D-3) 125 MCG (5000 UT) TABS Take 5,000 Units by mouth daily.    Multiple Vitamin (MULTIVITAMIN WITH MINERALS) TABS tablet Take 1  tablet by mouth daily. Alive Multivitamin   rosuvastatin (CRESTOR) 20 MG tablet Take 1 tablet (20 mg total) by mouth at bedtime.   sertraline (ZOLOFT) 25 MG tablet Take 1 tablet (25 mg total) by mouth every morning.   traZODone (DESYREL) 50 MG tablet Take 1.5 tablets (75 mg total) by mouth at bedtime.       01/26/2022    9:40 AM 07/22/2021   11:12 AM 01/21/2021   10:18 AM 12/29/2020    1:25 PM  GAD 7 : Generalized Anxiety Score  Nervous, Anxious, on Edge 0 0 0 0  Control/stop worrying 0 0 0 0  Worry too much - different things 0 0 0 0  Trouble relaxing 0 0 0 0  Restless 0 0 0 0  Easily annoyed or irritable 0 0 0 0  Afraid - awful might happen 0 0 0 0  Total GAD 7 Score 0 0 0 0  Anxiety Difficulty Not difficult at all Not difficult at all         01/26/2022    9:40 AM 01/15/2022    3:34  PM 07/22/2021   11:10 AM  Depression screen PHQ 2/9  Decreased Interest 0 0 0  Down, Depressed, Hopeless 0 0 0  PHQ - 2 Score 0 0 0  Altered sleeping 0  0  Tired, decreased energy 2  0  Change in appetite 0  0  Feeling bad or failure about yourself  0  0  Trouble concentrating 0  0  Moving slowly or fidgety/restless 0  0  Suicidal thoughts 0  0  PHQ-9 Score 2  0  Difficult doing work/chores Not difficult at all  Not difficult at all    BP Readings from Last 3 Encounters:  01/26/22 104/60  07/22/21 118/70  01/21/21 100/64    Physical Exam Vitals and nursing note reviewed. Exam conducted with a chaperone present.  Constitutional:      General: She is not irritable.She is not in acute distress.    Appearance: She is not diaphoretic.  HENT:     Head: Normocephalic and atraumatic.     Right Ear: Tympanic membrane and external ear normal.     Left Ear: Tympanic membrane and external ear normal.     Nose: Nose normal.     Mouth/Throat:     Mouth: Mucous membranes are moist.  Eyes:     General:        Right eye: No discharge.        Left eye: No discharge.     Conjunctiva/sclera:  Conjunctivae normal.     Pupils: Pupils are equal, round, and reactive to light.  Neck:     Thyroid: No thyromegaly.     Vascular: No JVD.  Cardiovascular:     Rate and Rhythm: Normal rate and regular rhythm.     Heart sounds: Normal heart sounds. No murmur heard.    No friction rub. No gallop.  Pulmonary:     Effort: Pulmonary effort is normal.     Breath sounds: Normal breath sounds. No wheezing, rhonchi or rales.  Abdominal:     General: Bowel sounds are normal.     Palpations: Abdomen is soft. There is no mass.     Tenderness: There is no abdominal tenderness. There is no guarding.  Musculoskeletal:        General: Normal range of motion.     Cervical back: Normal range of motion and neck supple.  Lymphadenopathy:     Cervical: No cervical adenopathy.  Skin:    General: Skin is warm and dry.  Neurological:     Mental Status: She is alert.     Deep Tendon Reflexes: Reflexes are normal and symmetric.     Wt Readings from Last 3 Encounters:  01/26/22 108 lb (49 kg)  07/22/21 106 lb (48.1 kg)  01/21/21 104 lb (47.2 kg)    BP 104/60   Pulse 64   Ht '5\' 3"'  (1.6 m)   Wt 108 lb (49 kg)   BMI 19.13 kg/m   Assessment and Plan:  1. Major depressive disorder in partial remission, unspecified whether recurrent ([place]) .  Controlled.  Stable.  PHQ is 2 Gad score is 0 but is actually probably a little more so increased on that.  Patient has been at 37.5 mg of sertraline and we will bump on up to 50 mg once a day.  We will recheck patient in 3 months. - sertraline (ZOLOFT) 50 MG tablet; Take 1 tablet (50 mg total) by mouth daily.  Dispense: 90 tablet; Refill: 0  2. Mixed hyperlipidemia  Chronic.  Controlled.  Stable.  Continue rosuvastatin 20 mg nightly. - rosuvastatin (CRESTOR) 20 MG tablet; Take 1 tablet (20 mg total) by mouth at bedtime.  Dispense: 90 tablet; Refill: 1  3. Primary insomnia Chronic.  Controlled.  Stable.  Insomnia is fragmented with early awakening and  difficulty resuming sleep I suspect this may be more to her anxiety depression than true insomnia we will continue the trazodone and of her dosing of sertraline as noted above. - traZODone (DESYREL) 50 MG tablet; Take 1.5 tablets (75 mg total) by mouth at bedtime.  Dispense: 135 tablet; Refill: 1    Otilio Miu, MD

## 2022-02-09 ENCOUNTER — Ambulatory Visit: Payer: Self-pay | Admitting: *Deleted

## 2022-02-09 ENCOUNTER — Ambulatory Visit (INDEPENDENT_AMBULATORY_CARE_PROVIDER_SITE_OTHER): Payer: Medicare Other | Admitting: Family Medicine

## 2022-02-09 ENCOUNTER — Encounter: Payer: Self-pay | Admitting: Family Medicine

## 2022-02-09 VITALS — BP 120/79 | HR 68 | Ht 63.0 in | Wt 105.0 lb

## 2022-02-09 DIAGNOSIS — J0100 Acute maxillary sinusitis, unspecified: Secondary | ICD-10-CM

## 2022-02-09 DIAGNOSIS — J029 Acute pharyngitis, unspecified: Secondary | ICD-10-CM | POA: Diagnosis not present

## 2022-02-09 LAB — POCT RAPID STREP A (OFFICE): Rapid Strep A Screen: NEGATIVE

## 2022-02-09 MED ORDER — AMOXICILLIN-POT CLAVULANATE 875-125 MG PO TABS: 1.0000 | ORAL_TABLET | Freq: Two times a day (BID) | ORAL | 0 refills | Status: DC

## 2022-02-09 NOTE — Progress Notes (Signed)
Date:  02/09/2022   Name:  Amanda Osborn   DOB:  05-May-1955   MRN:  [id number]   Chief Complaint: Sore Throat (Started yesterday, hurts to swallow- worse in the mornings, no fever, little cough)  Sore Throat  This is a new problem. The current episode started yesterday. The problem has been unchanged. The pain is worse on the left side. There has been no fever. The pain is at a severity of 8/10. The pain is moderate. Associated symptoms include coughing, headaches and a hoarse voice. Pertinent negatives include no congestion, ear discharge, shortness of breath, stridor or trouble swallowing. She has had no exposure to strep or mono. She has tried NSAIDs for the symptoms. The treatment provided mild relief.    Lab Results  Component Value Date   NA 143 07/22/2021   K 4.5 07/22/2021   CO2 25 07/22/2021   GLUCOSE 87 07/22/2021   BUN 24 07/22/2021   CREATININE 0.77 07/22/2021   CALCIUM 9.8 07/22/2021   EGFR 84 07/22/2021   GFRNONAA >60 09/13/2019   Lab Results  Component Value Date   CHOL 146 07/22/2021   HDL 68 07/22/2021   LDLCALC 68 07/22/2021   TRIG 43 07/22/2021   CHOLHDL 2.1 05/17/2018   Lab Results  Component Value Date   TSH 1.670 07/22/2021   Lab Results  Component Value Date   HGBA1C 5.3 12/29/2017   Lab Results  Component Value Date   WBC 4.6 07/22/2021   HGB 13.0 07/22/2021   HCT 37.6 07/22/2021   MCV 93 07/22/2021   PLT 166 07/22/2021   Lab Results  Component Value Date   ALT 30 07/22/2021   AST 26 07/22/2021   ALKPHOS 52 07/22/2021   BILITOT 0.4 07/22/2021   No results found for: "25OHVITD2", "25OHVITD3", "VD25OH"   Review of Systems  HENT:  Positive for hoarse voice. Negative for congestion, ear discharge, sinus pressure and trouble swallowing.   Respiratory:  Positive for cough. Negative for apnea, shortness of breath and stridor.   Cardiovascular:  Negative for chest pain, palpitations and leg swelling.  Neurological:  Positive for  headaches.    Patient Active Problem List   Diagnosis Date Noted   Pelvic pain in female 08/30/2019   Dizziness 03/21/2019   CVA (cerebral vascular accident) ([place]) 12/29/2017   Slurred speech 12/28/2017   Malignant neoplastic disease ([place]) 03/18/2015   Chondromalacia of patella 09/13/2014   Bursitis 09/13/2014   Allergic rhinitis 09/13/2014   Cancer ([place]) 09/13/2014   Cervical disc disease 09/13/2014    Allergies  Allergen Reactions   Montelukast Sodium     Caused headaches    Past Surgical History:  Procedure Laterality Date   AUGMENTATION MAMMAPLASTY Bilateral 2011   BREAST CYST ASPIRATION Bilateral    BREAST EXCISIONAL BIOPSY Left 2006   breast ca   BREAST LUMPECTOMY     CARDIAC CATHETERIZATION     COLONOSCOPY     COLONOSCOPY WITH PROPOFOL N/A 06/28/2016   Procedure: COLONOSCOPY WITH PROPOFOL;  Surgeon: Lollie Sails, MD;  Location: [facility] ENDOSCOPY;  Service: Endoscopy;  Laterality: N/A;   CYSTOSCOPY W/ URETERAL STENT PLACEMENT Left 09/21/2019   Procedure: CYSTOSCOPY WITH RETROGRADE PYELOGRAM/URETERAL STENT PLACEMENT;  Surgeon: Billey Co, MD;  Location: [facility] ORS;  Service: Urology;  Laterality: Left;   LYMPH NODE BIOPSY     NECK   MASTECTOMY Bilateral    TEE WITHOUT CARDIOVERSION N/A 12/30/2017   Procedure: TRANSESOPHAGEAL ECHOCARDIOGRAM (TEE);  Surgeon: Bartholome Bill  A, MD;  Location: [facility] ORS;  Service: Cardiovascular;  Laterality: N/A;   TOTAL VAGINAL HYSTERECTOMY     URETEROSCOPY Left 09/21/2019   Procedure: URETEROSCOPY-DIAGNOSTIC;  Surgeon: Billey Co, MD;  Location: [facility] ORS;  Service: Urology;  Laterality: Left;   VAGINAL HYSTERECTOMY      Social History   Tobacco Use   Smoking status: Never   Smokeless tobacco: Never  Vaping Use   Vaping Use: Never used  Substance Use Topics   Alcohol use: No    Alcohol/week: 0.0 standard drinks of alcohol   Drug use: No     Medication list has been reviewed and updated.  Current Meds   Medication Sig   acyclovir (ZOVIRAX) 400 MG tablet Take 400 mg by mouth 2 (two) times daily as needed (fever blister).    aspirin EC 325 MG EC tablet Take 1 tablet (325 mg total) by mouth daily.   buPROPion (WELLBUTRIN SR) 100 MG 12 hr tablet Take 100 mg by mouth every morning.    Cholecalciferol (VITAMIN D-3) 125 MCG (5000 UT) TABS Take 5,000 Units by mouth daily.    Multiple Vitamin (MULTIVITAMIN WITH MINERALS) TABS tablet Take 1 tablet by mouth daily. Alive Multivitamin   rosuvastatin (CRESTOR) 20 MG tablet Take 1 tablet (20 mg total) by mouth at bedtime.   sertraline (ZOLOFT) 50 MG tablet Take 1 tablet (50 mg total) by mouth daily.   traZODone (DESYREL) 50 MG tablet Take 1.5 tablets (75 mg total) by mouth at bedtime.       02/09/2022    2:35 PM 01/26/2022    9:40 AM 07/22/2021   11:12 AM 01/21/2021   10:18 AM  GAD 7 : Generalized Anxiety Score  Nervous, Anxious, on Edge 0 0 0 0  Control/stop worrying 0 0 0 0  Worry too much - different things 0 0 0 0  Trouble relaxing 0 0 0 0  Restless 0 0 0 0  Easily annoyed or irritable 0 0 0 0  Afraid - awful might happen 0 0 0 0  Total GAD 7 Score 0 0 0 0  Anxiety Difficulty Not difficult at all Not difficult at all Not difficult at all        02/09/2022    2:35 PM 01/26/2022    9:40 AM 01/15/2022    3:34 PM  Depression screen PHQ 2/9  Decreased Interest 0 0 0  Down, Depressed, Hopeless 0 0 0  PHQ - 2 Score 0 0 0  Altered sleeping 0 0   Tired, decreased energy 0 2   Change in appetite 0 0   Feeling bad or failure about yourself  0 0   Trouble concentrating 0 0   Moving slowly or fidgety/restless 0 0   Suicidal thoughts 0 0   PHQ-9 Score 0 2   Difficult doing work/chores Not difficult at all Not difficult at all     BP Readings from Last 3 Encounters:  02/09/22 120/79  01/26/22 104/60  07/22/21 118/70    Physical Exam Vitals and nursing note reviewed.  HENT:     Right Ear: Hearing, tympanic membrane, ear canal and  external ear normal.     Left Ear: Hearing, tympanic membrane, ear canal and external ear normal.  Neck:     Thyroid: No thyroid mass, thyromegaly or thyroid tenderness.     Vascular: Normal carotid pulses. No carotid bruit, hepatojugular reflux or JVD.     Trachea: Trachea and phonation normal.  Cardiovascular:  Chest Wall: PMI is not displaced. No thrill.     Pulses: Normal pulses.     Heart sounds: S1 normal and S2 normal.     No systolic murmur is present.     No diastolic murmur is present.     No S3 or S4 sounds.  Musculoskeletal:     Cervical back: Full passive range of motion without pain, normal range of motion and neck supple.     Right lower leg: No edema.     Left lower leg: No edema.  Lymphadenopathy:     Cervical: No cervical adenopathy.     Right cervical: No superficial, deep or posterior cervical adenopathy.    Left cervical: No superficial, deep or posterior cervical adenopathy.     Wt Readings from Last 3 Encounters:  02/09/22 105 lb (47.6 kg)  01/26/22 108 lb (49 kg)  07/22/21 106 lb (48.1 kg)    BP 120/79   Pulse 68   Ht _0  (1.6 m)   Wt 105 lb (47.6 kg)   SpO2 99%   BMI 18.60 kg/m   Assessment and Plan:  1. Sore throat New onset.  Persistent.  Awoke with sore throat that with an 8/10 this morning and has been taking ibuprofen.  Culture was done and it was noted to be negative.  2. Acute maxillary sinusitis, recurrence not specified Further evaluation notes that patient has tenderness over the maxillary sinuses bilateral little postnasal drainage is seen on oral exam.  We will initiate Augmentin 875 mg twice a day for 10 days. - POCT rapid strep A    Otilio Miu, MD

## 2022-02-09 NOTE — Telephone Encounter (Signed)
Summary: sore throat   Pt states she has a "terrible sore throat" and requesting a Rx   Please assist further       Called pt - [id number]

## 2022-02-09 NOTE — Telephone Encounter (Signed)
  Chief Complaint: Sore throat Symptoms: Sore throat 7/10, sinus congestion Frequency: Yesterday Pertinent Negatives: Patient denies fever, pus on tonsils Disposition: '[]'$ ED /'[]'$ Urgent Care (no appt availability in office) / '[]'$ Appointment(In office/virtual)/ '[]'$  [place] [facility]/ '[]'$ Home Care/ '[]'$ Refused Recommended Disposition /'[]'$ [place] [facility]/ '[]'$  Follow-up with PCP Additional Notes: PT states that the sore throat was so bad she had trouble swallowing this morning, but is has slightly improved throughout the day. Pt states she get this every year.    Summary: sore throat   Pt states she has a "terrible sore throat" and requesting a Rx   Please assist further      Reason for Disposition  SEVERE (e.g., excruciating) throat pain  Answer Assessment - Initial Assessment Questions 1. ONSET: "When did the throat start hurting?" (Hours or days ago)      yesterday 2. SEVERITY: "How bad is the sore throat?" (Scale 1-10; mild, moderate or severe)   - MILD (1-3):  Doesn't interfere with eating or normal activities.   - MODERATE (4-7): Interferes with eating some solids and normal activities.   - SEVERE (8-10):  Excruciating pain, interferes with most normal activities.   - SEVERE WITH DYSPHAGIA (10): Can't swallow liquids, drooling.     This morning could not swallow - now pain is a 7/10 3. STREP EXPOSURE: "Has there been any exposure to strep within the past week?" If Yes, ask: "What type of contact occurred?"      no 4.  VIRAL SYMPTOMS: "Are there any symptoms of a cold, such as a runny nose, cough, hoarse voice or red eyes?"      Sinus congestion 5. FEVER: "Do you have a fever?" If Yes, ask: "What is your temperature, how was it measured, and when did it start?"     no 6. PUS ON THE TONSILS: "Is there pus on the tonsils in the back of your throat?"     no 7. OTHER SYMPTOMS: "Do you have any other symptoms?" (e.g., difficulty breathing, headache, rash)     no 8. PREGNANCY: "Is  there any chance you are pregnant?" "When was your last menstrual period?"  Protocols used: Sore Throat-A-AH

## 2022-02-09 NOTE — Telephone Encounter (Signed)
Summary: sore throat   Pt states she has a "terrible sore throat" and requesting a Rx   Please assist further       Called patient to review sx of sore throat. No answer, LVMTCB and schedule appt and review sx.

## 2022-02-18 ENCOUNTER — Ambulatory Visit: Payer: Self-pay

## 2022-02-18 ENCOUNTER — Ambulatory Visit: Payer: Self-pay | Admitting: *Deleted

## 2022-02-18 NOTE — Telephone Encounter (Signed)
Pt called in c/o continued coughing, sore throat and congestion after seeing Dr. Ronnald Ramp last week.  Seeking advice.   More medications?  Attempted to return her call.   Left voicemail to call back to discuss symptoms with a nurse.

## 2022-02-18 NOTE — Telephone Encounter (Signed)
Has 2 doses- sx went away- came back yesterday- congestion breaking up tickle  Red throat Cough- phlegm pale yellow Denies SOB and fever Nasal discharge lt pale yellow .  6 now at night 7 at night  Can swallow now.  No chest pain  Tolerating fluids  Chief Complaint: sore throat came back yesterday Symptoms: cough with lt yellow secretions and sinus drainage with lt yellow secretions, post nasal drip, tickle back of throat Frequency: sx back yesterday, has 2 doses of abx left Pertinent Negatives: Patient denies fever, difficulty swallowing, headache, rash,pus at back of throat Disposition: '[]'$ ED /'[]'$ Urgent Care (no appt availability in office) / '[]'$ Appointment(In office/virtual)/ '[]'$  [place] [facility]/ '[]'$ Home Care/ '[]'$ Refused Recommended Disposition /'[]'$ [place] [facility]/ '[x]'$  Follow-up with PCP Additional Notes: pt would like to know if needs further abx or treatment for throat pain.  Sx went away but came back yesterday. No pus back of throat   Reason for Disposition  [1] Sore throat with cough/cold symptoms AND [2] present > 5 days  Answer Assessment - Initial Assessment Questions 1. ONSET: "When did the throat start hurting?" (Hours or days ago)      02/10/22 went away and then yesterday sore throat is back 2. SEVERITY: "How bad is the sore throat?" (Scale 1-10; mild, moderate or severe)   - MILD (1-3):  Doesn't interfere with eating or normal activities.   - MODERATE (4-7): Interferes with eating some solids and normal activities.   - SEVERE (8-10):  Excruciating pain, interferes with most normal activities.   - SEVERE WITH DYSPHAGIA (10): Can't swallow liquids, drooling.     moderate 3. STREP EXPOSURE: "Has there been any exposure to strep within the past week?" If Yes, ask: "What type of contact occurred?"      no 4.  VIRAL SYMPTOMS: "Are there any symptoms of a cold, such as a runny nose, cough, hoarse voice or red eyes?"      Nasal and sinus congestion, tickle back of  throat, cough and nasal discharge with lt yellow phlegm 5. FEVER: "Do you have a fever?" If Yes, ask: "What is your temperature, how was it measured, and when did it start?"     no 6. PUS ON THE TONSILS: "Is there pus on the tonsils in the back of your throat?"     no 7. OTHER SYMPTOMS: "Do you have any other symptoms?" (e.g., difficulty breathing, headache, rash)     no 8. PREGNANCY: "Is there any chance you are pregnant?" "When was your last menstrual period?"     N/a  Protocols used: Sore Throat-A-AH

## 2022-02-19 ENCOUNTER — Ambulatory Visit
Admission: RE | Admit: 2022-02-19 | Discharge: 2022-02-19 | Disposition: A | Discharge status: Home / Self Care | Payer: Medicare Other | Attending: Family Medicine | Admitting: Family Medicine

## 2022-02-19 ENCOUNTER — Ambulatory Visit
Admission: RE | Admit: 2022-02-19 | Discharge: 2022-02-19 | Disposition: A | Discharge status: Home / Self Care | Payer: Medicare Other | Source: Ambulatory Visit | Attending: Family Medicine | Admitting: Family Medicine

## 2022-02-19 ENCOUNTER — Ambulatory Visit (INDEPENDENT_AMBULATORY_CARE_PROVIDER_SITE_OTHER): Payer: Medicare Other | Admitting: Family Medicine

## 2022-02-19 ENCOUNTER — Encounter: Payer: Self-pay | Admitting: Family Medicine

## 2022-02-19 VITALS — BP 120/78 | HR 72 | Temp 98.6°F | Ht 63.0 in | Wt 105.0 lb

## 2022-02-19 DIAGNOSIS — R052 Subacute cough: Secondary | ICD-10-CM | POA: Diagnosis not present

## 2022-02-19 DIAGNOSIS — R059 Cough, unspecified: Secondary | ICD-10-CM | POA: Diagnosis not present

## 2022-02-19 DIAGNOSIS — J029 Acute pharyngitis, unspecified: Secondary | ICD-10-CM

## 2022-02-19 DIAGNOSIS — R0981 Nasal congestion: Secondary | ICD-10-CM

## 2022-02-19 MED ORDER — GUAIFENESIN-CODEINE 100-10 MG/5ML PO SYRP: 5.0000 mL | ORAL_SOLUTION | Freq: Three times a day (TID) | ORAL | 0 refills | Status: DC | PRN

## 2022-02-19 MED ORDER — PREDNISONE 10 MG PO TABS: 10.0000 mg | ORAL_TABLET | Freq: Every day | ORAL | 0 refills | Status: DC

## 2022-02-19 MED ORDER — TRIAMCINOLONE ACETONIDE 55 MCG/ACT NA AERO: 2.0000 | INHALATION_SPRAY | Freq: Every day | NASAL | 12 refills | Status: DC

## 2022-02-19 NOTE — Progress Notes (Signed)
Date:  02/19/2022   Name:  Amanda Osborn   DOB:  1955-09-15   MRN:  [id number]   Chief Complaint: Sore Throat (congestion)  Sore Throat  This is a new problem. The current episode started 1 to 4 weeks ago (2 weeks). The pain is moderate. Associated symptoms include congestion, coughing, headaches and a hoarse voice. Pertinent negatives include no diarrhea, ear discharge, ear pain, plugged ear sensation, shortness of breath or trouble swallowing. She has had no exposure to strep or mono. Treatments tried: augmentin.    Lab Results  Component Value Date   NA 143 07/22/2021   K 4.5 07/22/2021   CO2 25 07/22/2021   GLUCOSE 87 07/22/2021   BUN 24 07/22/2021   CREATININE 0.77 07/22/2021   CALCIUM 9.8 07/22/2021   EGFR 84 07/22/2021   GFRNONAA >60 09/13/2019   Lab Results  Component Value Date   CHOL 146 07/22/2021   HDL 68 07/22/2021   LDLCALC 68 07/22/2021   TRIG 43 07/22/2021   CHOLHDL 2.1 05/17/2018   Lab Results  Component Value Date   TSH 1.670 07/22/2021   Lab Results  Component Value Date   HGBA1C 5.3 12/29/2017   Lab Results  Component Value Date   WBC 4.6 07/22/2021   HGB 13.0 07/22/2021   HCT 37.6 07/22/2021   MCV 93 07/22/2021   PLT 166 07/22/2021   Lab Results  Component Value Date   ALT 30 07/22/2021   AST 26 07/22/2021   ALKPHOS 52 07/22/2021   BILITOT 0.4 07/22/2021   No results found for: "25OHVITD2", "25OHVITD3", "VD25OH"   Review of Systems  HENT:  Positive for congestion, hoarse voice and sneezing. Negative for ear discharge, ear pain and trouble swallowing.   Respiratory:  Positive for cough. Negative for shortness of breath and wheezing.   Cardiovascular:  Negative for chest pain and palpitations.  Gastrointestinal:  Negative for diarrhea.  Neurological:  Positive for headaches.    Patient Active Problem List   Diagnosis Date Noted   Pelvic pain in female 08/30/2019   Dizziness 03/21/2019   CVA (cerebral vascular accident)  ([place]) 12/29/2017   Slurred speech 12/28/2017   Malignant neoplastic disease ([place]) 03/18/2015   Chondromalacia of patella 09/13/2014   Bursitis 09/13/2014   Allergic rhinitis 09/13/2014   Cancer ([place]) 09/13/2014   Cervical disc disease 09/13/2014    Allergies  Allergen Reactions   Montelukast Sodium     Caused headaches    Past Surgical History:  Procedure Laterality Date   AUGMENTATION MAMMAPLASTY Bilateral 2011   BREAST CYST ASPIRATION Bilateral    BREAST EXCISIONAL BIOPSY Left 2006   breast ca   BREAST LUMPECTOMY     CARDIAC CATHETERIZATION     COLONOSCOPY     COLONOSCOPY WITH PROPOFOL N/A 06/28/2016   Procedure: COLONOSCOPY WITH PROPOFOL;  Surgeon: Lollie Sails, MD;  Location: [facility] ENDOSCOPY;  Service: Endoscopy;  Laterality: N/A;   CYSTOSCOPY W/ URETERAL STENT PLACEMENT Left 09/21/2019   Procedure: CYSTOSCOPY WITH RETROGRADE PYELOGRAM/URETERAL STENT PLACEMENT;  Surgeon: Billey Co, MD;  Location: [facility] ORS;  Service: Urology;  Laterality: Left;   LYMPH NODE BIOPSY     NECK   MASTECTOMY Bilateral    TEE WITHOUT CARDIOVERSION N/A 12/30/2017   Procedure: TRANSESOPHAGEAL ECHOCARDIOGRAM (TEE);  Surgeon: Teodoro Spray, MD;  Location: [facility] ORS;  Service: Cardiovascular;  Laterality: N/A;   TOTAL VAGINAL HYSTERECTOMY     URETEROSCOPY Left 09/21/2019   Procedure: URETEROSCOPY-DIAGNOSTIC;  Surgeon: Diamantina Providence,  Herbert Seta, MD;  Location: [facility] ORS;  Service: Urology;  Laterality: Left;   VAGINAL HYSTERECTOMY      Social History   Tobacco Use   Smoking status: Never   Smokeless tobacco: Never  Vaping Use   Vaping Use: Never used  Substance Use Topics   Alcohol use: No    Alcohol/week: 0.0 standard drinks of alcohol   Drug use: No     Medication list has been reviewed and updated.  Current Meds  Medication Sig   acyclovir (ZOVIRAX) 400 MG tablet Take 400 mg by mouth 2 (two) times daily as needed (fever blister).    amoxicillin-clavulanate (AUGMENTIN) 875-125 MG  tablet Take 1 tablet by mouth 2 (two) times daily.   aspirin EC 325 MG EC tablet Take 1 tablet (325 mg total) by mouth daily.   buPROPion (WELLBUTRIN SR) 100 MG 12 hr tablet Take 100 mg by mouth every morning.    Cholecalciferol (VITAMIN D-3) 125 MCG (5000 UT) TABS Take 5,000 Units by mouth daily.    Multiple Vitamin (MULTIVITAMIN WITH MINERALS) TABS tablet Take 1 tablet by mouth daily. Alive Multivitamin   rosuvastatin (CRESTOR) 20 MG tablet Take 1 tablet (20 mg total) by mouth at bedtime.   sertraline (ZOLOFT) 50 MG tablet Take 1 tablet (50 mg total) by mouth daily.   traZODone (DESYREL) 50 MG tablet Take 1.5 tablets (75 mg total) by mouth at bedtime.       02/19/2022    8:26 AM 02/09/2022    2:35 PM 01/26/2022    9:40 AM 07/22/2021   11:12 AM  GAD 7 : Generalized Anxiety Score  Nervous, Anxious, on Edge 0 0 0 0  Control/stop worrying 0 0 0 0  Worry too much - different things 0 0 0 0  Trouble relaxing 0 0 0 0  Restless 0 0 0 0  Easily annoyed or irritable 0 0 0 0  Afraid - awful might happen 0 0 0 0  Total GAD 7 Score 0 0 0 0  Anxiety Difficulty Not difficult at all Not difficult at all Not difficult at all Not difficult at all       02/19/2022    8:26 AM 02/09/2022    2:35 PM 01/26/2022    9:40 AM  Depression screen PHQ 2/9  Decreased Interest 0 0 0  Down, Depressed, Hopeless 0 0 0  PHQ - 2 Score 0 0 0  Altered sleeping 0 0 0  Tired, decreased energy 0 0 2  Change in appetite 0 0 0  Feeling bad or failure about yourself  0 0 0  Trouble concentrating 0 0 0  Moving slowly or fidgety/restless 0 0 0  Suicidal thoughts 0 0 0  PHQ-9 Score 0 0 2  Difficult doing work/chores Not difficult at all Not difficult at all Not difficult at all    BP Readings from Last 3 Encounters:  02/19/22 120/78  02/09/22 120/79  01/26/22 104/60    Physical Exam Vitals and nursing note reviewed.  HENT:     Right Ear: Ear canal normal. No drainage, swelling or tenderness. A middle ear  effusion is present. Tympanic membrane is not erythematous.     Left Ear: Tympanic membrane and ear canal normal. No drainage, swelling or tenderness.  No middle ear effusion.     Nose: Congestion present.     Mouth/Throat:     Mouth: Mucous membranes are moist.     Pharynx: Posterior oropharyngeal erythema present.  Tonsils: No tonsillar exudate or tonsillar abscesses.  Cardiovascular:     Heart sounds: No murmur heard.    No gallop.  Musculoskeletal:     Cervical back: Neck supple.  Lymphadenopathy:     Cervical: No cervical adenopathy.     Wt Readings from Last 3 Encounters:  02/19/22 105 lb (47.6 kg)  02/09/22 105 lb (47.6 kg)  01/26/22 108 lb (49 kg)    BP 120/78   Pulse 72   Temp 98.6 F (37 C) (Oral)   Ht _0  (1.6 m)   Wt 105 lb (47.6 kg)   SpO2 96%   BMI 18.60 kg/m   Assessment and Plan: 1. Sore throat Chronic.  Persistent.  Recurrent on a yearly basis at the same time.  This is very reminiscent of likely an allergic pharyngitis.  We will proceed with encouraging Nasacort nasal spray as noted below along with some prednisone.  Patient will be given instructions on using Sudafed and we will recheck on an as-needed basis. - predniSONE (DELTASONE) 10 MG tablet; Take 1 tablet (10 mg total) by mouth daily with breakfast.  Dispense: 30 tablet; Refill: 0  2. Sinus congestion Chronic.  Episodic.  Seasonal.  Consistent with allergic rhinitis we will treat with Nasacort and we will do a short dosing of prednisone until a good freeze frost morning. - triamcinolone (NASACORT) 55 MCG/ACT AERO nasal inhaler; Place 2 sprays into the nose daily.  Dispense: 1 each; Refill: 12  3. Subacute cough Cough.  Persistent.  This has been recurrent in the past and we will obtain a chest x-ray to rule out any underlying cardiopulmonary concern.  In the meantime we will use Robitussin-AC to suppress cough. - DG Chest 2 View; Future - guaiFENesin-codeine (ROBITUSSIN AC) 100-10 MG/5ML  syrup; Take 5 mLs by mouth 3 (three) times daily as needed for cough.  Dispense: 118 mL; Refill: 0     Otilio Miu, MD

## 2022-03-23 DIAGNOSIS — F5105 Insomnia due to other mental disorder: Secondary | ICD-10-CM | POA: Diagnosis not present

## 2022-03-23 DIAGNOSIS — F411 Generalized anxiety disorder: Secondary | ICD-10-CM | POA: Diagnosis not present

## 2022-06-17 DIAGNOSIS — F411 Generalized anxiety disorder: Secondary | ICD-10-CM | POA: Diagnosis not present

## 2022-06-17 DIAGNOSIS — F5105 Insomnia due to other mental disorder: Secondary | ICD-10-CM | POA: Diagnosis not present

## 2022-07-28 ENCOUNTER — Encounter: Payer: Self-pay | Admitting: Family Medicine

## 2022-07-28 ENCOUNTER — Ambulatory Visit (INDEPENDENT_AMBULATORY_CARE_PROVIDER_SITE_OTHER): Payer: Medicare Other | Admitting: Family Medicine

## 2022-07-28 VITALS — BP 110/72 | HR 68 | Ht 63.0 in | Wt 105.0 lb

## 2022-07-28 DIAGNOSIS — E782 Mixed hyperlipidemia: Secondary | ICD-10-CM

## 2022-07-28 DIAGNOSIS — F324 Major depressive disorder, single episode, in partial remission: Secondary | ICD-10-CM

## 2022-07-28 DIAGNOSIS — F5101 Primary insomnia: Secondary | ICD-10-CM | POA: Diagnosis not present

## 2022-07-28 DIAGNOSIS — R0981 Nasal congestion: Secondary | ICD-10-CM

## 2022-07-28 MED ORDER — TRAZODONE HCL 50 MG PO TABS: 75.0000 mg | ORAL_TABLET | Freq: Every day | ORAL | 1 refills | Status: DC

## 2022-07-28 MED ORDER — TRIAMCINOLONE ACETONIDE 55 MCG/ACT NA AERO
2.0000 | INHALATION_SPRAY | Freq: Every day | NASAL | 12 refills | Status: DC
Start: 2022-07-28 — End: 2023-04-01

## 2022-07-28 MED ORDER — SERTRALINE HCL 50 MG PO TABS: 50.0000 mg | ORAL_TABLET | Freq: Every day | ORAL | 1 refills | Status: DC

## 2022-07-28 MED ORDER — ROSUVASTATIN CALCIUM 20 MG PO TABS: 20.0000 mg | ORAL_TABLET | Freq: Every day | ORAL | 1 refills | Status: DC

## 2022-07-28 NOTE — Progress Notes (Signed)
Date:  07/28/2022   Name:  Amanda Osborn   DOB:  [birth year]   MRN:  [id number]   Chief Complaint: Hyperlipidemia, Depression, and Insomnia  Hyperlipidemia This is a chronic problem. The current episode started more than 1 year ago. The problem is controlled. Recent lipid tests were reviewed and are normal. Pertinent negatives include no chest pain or shortness of breath. Current antihyperlipidemic treatment includes diet change and statins. The current treatment provides moderate improvement of lipids. There are no compliance problems.   Depression        This is a chronic problem.  The current episode started more than 1 year ago.   The onset quality is undetermined.   The problem occurs intermittently.  The problem has been gradually improving since onset.  Associated symptoms include insomnia.  Associated symptoms include no fatigue.  Past treatments include SSRIs - Selective serotonin reuptake inhibitors.  Compliance with treatment is good.  Past compliance problems include difficulty understanding directions.  Previous treatment provided moderate relief. Insomnia Primary symptoms: no somnolence.   The onset quality is gradual. The problem has been gradually improving since onset. Past treatments include medication (trazadone). The treatment provided moderate relief. PMH includes: depression.     Lab Results  Component Value Date   NA 143 07/22/2021   K 4.5 07/22/2021   CO2 25 07/22/2021   GLUCOSE 87 07/22/2021   BUN 24 07/22/2021   CREATININE 0.77 07/22/2021   CALCIUM 9.8 07/22/2021   EGFR 84 07/22/2021   GFRNONAA >60 09/13/2019   Lab Results  Component Value Date   CHOL 146 07/22/2021   HDL 68 07/22/2021   LDLCALC 68 07/22/2021   TRIG 43 07/22/2021   CHOLHDL 2.1 05/17/2018   Lab Results  Component Value Date   TSH 1.670 07/22/2021   Lab Results  Component Value Date   HGBA1C 5.3 12/29/2017   Lab Results  Component Value Date   WBC 4.6 07/22/2021   HGB 13.0  07/22/2021   HCT 37.6 07/22/2021   MCV 93 07/22/2021   PLT 166 07/22/2021   Lab Results  Component Value Date   ALT 30 07/22/2021   AST 26 07/22/2021   ALKPHOS 52 07/22/2021   BILITOT 0.4 07/22/2021   No results found for: "25OHVITD2", "25OHVITD3", "VD25OH"   Review of Systems  Constitutional:  Negative for fatigue and unexpected weight change.  HENT:  Negative for sinus pressure.   Eyes:  Negative for photophobia.  Respiratory:  Negative for shortness of breath and wheezing.   Cardiovascular:  Negative for chest pain, palpitations and leg swelling.  Gastrointestinal:  Positive for constipation. Negative for abdominal pain and diarrhea.  Psychiatric/Behavioral:  Positive for depression. The patient has insomnia.     Patient Active Problem List   Diagnosis Date Noted   Pelvic pain in female 08/30/2019   Dizziness 03/21/2019   CVA (cerebral vascular accident) 12/29/2017   Slurred speech 12/28/2017   Malignant neoplastic disease 03/18/2015   Chondromalacia of patella 09/13/2014   Bursitis 09/13/2014   Allergic rhinitis 09/13/2014   Cancer 09/13/2014   Cervical disc disease 09/13/2014    Allergies  Allergen Reactions   Montelukast Sodium     Caused headaches    Past Surgical History:  Procedure Laterality Date   AUGMENTATION MAMMAPLASTY Bilateral 2011   BREAST CYST ASPIRATION Bilateral    BREAST EXCISIONAL BIOPSY Left 2006   breast ca   BREAST LUMPECTOMY     CARDIAC CATHETERIZATION  COLONOSCOPY     COLONOSCOPY WITH PROPOFOL N/A 06/28/2016   Procedure: COLONOSCOPY WITH PROPOFOL;  Surgeon: Christena Deem, MD;  Location: [facility] ENDOSCOPY;  Service: Endoscopy;  Laterality: N/A;   CYSTOSCOPY W/ URETERAL STENT PLACEMENT Left 09/21/2019   Procedure: CYSTOSCOPY WITH RETROGRADE PYELOGRAM/URETERAL STENT PLACEMENT;  Surgeon: Sondra Come, MD;  Location: [facility] ORS;  Service: Urology;  Laterality: Left;   LYMPH NODE BIOPSY     NECK   MASTECTOMY Bilateral    TEE  WITHOUT CARDIOVERSION N/A 12/30/2017   Procedure: TRANSESOPHAGEAL ECHOCARDIOGRAM (TEE);  Surgeon: Dalia Heading, MD;  Location: [facility] ORS;  Service: Cardiovascular;  Laterality: N/A;   TOTAL VAGINAL HYSTERECTOMY     URETEROSCOPY Left 09/21/2019   Procedure: URETEROSCOPY-DIAGNOSTIC;  Surgeon: Sondra Come, MD;  Location: [facility] ORS;  Service: Urology;  Laterality: Left;   VAGINAL HYSTERECTOMY      Social History   Tobacco Use   Smoking status: Never   Smokeless tobacco: Never  Vaping Use   Vaping Use: Never used  Substance Use Topics   Alcohol use: No    Alcohol/week: 0.0 standard drinks of alcohol   Drug use: No     Medication list has been reviewed and updated.  Current Meds  Medication Sig   acyclovir (ZOVIRAX) 400 MG tablet Take 400 mg by mouth 2 (two) times daily as needed (fever blister).    aspirin EC 325 MG EC tablet Take 1 tablet (325 mg total) by mouth daily.   buPROPion (WELLBUTRIN SR) 100 MG 12 hr tablet Take 100 mg by mouth every morning.    Cholecalciferol (VITAMIN D-3) 125 MCG (5000 UT) TABS Take 5,000 Units by mouth daily.    Multiple Vitamin (MULTIVITAMIN WITH MINERALS) TABS tablet Take 1 tablet by mouth daily. Alive Multivitamin   rosuvastatin (CRESTOR) 20 MG tablet Take 1 tablet (20 mg total) by mouth at bedtime.   sertraline (ZOLOFT) 50 MG tablet Take 1 tablet (50 mg total) by mouth daily.   traZODone (DESYREL) 50 MG tablet Take 1.5 tablets (75 mg total) by mouth at bedtime.   triamcinolone (NASACORT) 55 MCG/ACT AERO nasal inhaler Place 2 sprays into the nose daily.   [DISCONTINUED] amoxicillin-clavulanate (AUGMENTIN) 875-125 MG tablet Take 1 tablet by mouth 2 (two) times daily.       07/28/2022   10:08 AM 02/19/2022    8:26 AM 02/09/2022    2:35 PM 01/26/2022    9:40 AM  GAD 7 : Generalized Anxiety Score  Nervous, Anxious, on Edge 0 0 0 0  Control/stop worrying 0 0 0 0  Worry too much - different things 0 0 0 0  Trouble relaxing 0 0 0 0  Restless  0 0 0 0  Easily annoyed or irritable 0 0 0 0  Afraid - awful might happen 0 0 0 0  Total GAD 7 Score 0 0 0 0  Anxiety Difficulty Not difficult at all Not difficult at all Not difficult at all Not difficult at all       07/28/2022   10:08 AM 02/19/2022    8:26 AM 02/09/2022    2:35 PM  Depression screen PHQ 2/9  Decreased Interest 0 0 0  Down, Depressed, Hopeless 0 0 0  PHQ - 2 Score 0 0 0  Altered sleeping 0 0 0  Tired, decreased energy 0 0 0  Change in appetite 0 0 0  Feeling bad or failure about yourself  0 0 0  Trouble concentrating 0 0 0  Moving slowly or fidgety/restless 0 0 0  Suicidal thoughts 0 0 0  PHQ-9 Score 0 0 0  Difficult doing work/chores Not difficult at all Not difficult at all Not difficult at all    BP Readings from Last 3 Encounters:  07/28/22 110/72  02/19/22 120/78  02/09/22 120/79    Physical Exam Vitals and nursing note reviewed. Exam conducted with a chaperone present.  Constitutional:      General: She is not in acute distress.    Appearance: She is not diaphoretic.  HENT:     Head: Normocephalic and atraumatic.     Right Ear: Tympanic membrane and external ear normal. There is no impacted cerumen.     Left Ear: Tympanic membrane and external ear normal. There is no impacted cerumen.     Nose: Nose normal. No congestion or rhinorrhea.     Mouth/Throat:     Mouth: Mucous membranes are moist.  Eyes:     General:        Right eye: No discharge.        Left eye: No discharge.     Conjunctiva/sclera: Conjunctivae normal.     Pupils: Pupils are equal, round, and reactive to light.  Neck:     Thyroid: No thyromegaly.     Vascular: No JVD.  Cardiovascular:     Rate and Rhythm: Normal rate and regular rhythm.     Heart sounds: Normal heart sounds, S1 normal and S2 normal. No murmur heard.    No systolic murmur is present.     No diastolic murmur is present.     No friction rub. No gallop. No S3 or S4 sounds.  Pulmonary:     Effort:  Pulmonary effort is normal.     Breath sounds: Normal breath sounds. No wheezing, rhonchi or rales.  Abdominal:     General: Bowel sounds are normal.     Palpations: Abdomen is soft. There is no hepatomegaly, splenomegaly or mass.     Tenderness: There is no abdominal tenderness. There is no guarding.  Musculoskeletal:        General: Normal range of motion.     Cervical back: Normal range of motion and neck supple.     Right lower leg: No edema.     Left lower leg: No edema.  Lymphadenopathy:     Cervical: No cervical adenopathy.  Skin:    General: Skin is warm and dry.  Neurological:     Mental Status: She is alert.     Deep Tendon Reflexes: Reflexes are normal and symmetric.     Wt Readings from Last 3 Encounters:  07/28/22 105 lb (47.6 kg)  02/19/22 105 lb (47.6 kg)  02/09/22 105 lb (47.6 kg)    BP 110/72   Pulse 68   Ht  (1.6 m)   Wt 105 lb (47.6 kg)   SpO2 98%   BMI 18.60 kg/m   Assessment and Plan: 1. Mixed hyperlipidemia Chronic.  Controlled.  Stable.  Continue rosuvastatin 20 mg once a day.  Will check lipid panel and CMP. - rosuvastatin (CRESTOR) 20 MG tablet; Take 1 tablet (20 mg total) by mouth at bedtime.  Dispense: 90 tablet; Refill: 1 - Lipid Panel With LDL/HDL Ratio - Comprehensive Metabolic Panel (CMET)  2. Major depressive disorder in partial remission, unspecified whether recurrent Chronic.  Controlled.  Stable.  Continue sertraline 50 mg once a day. - sertraline (ZOLOFT) 50 MG tablet; Take 1 tablet (50 mg total) by  mouth daily.  Dispense: 90 tablet; Refill: 1  3. Primary insomnia Chronic.  Controlled.  Stable.  Continue trazodone 50 mg 1-1/2 tabs nightly. - traZODone (DESYREL) 50 MG tablet; Take 1.5 tablets (75 mg total) by mouth at bedtime.  Dispense: 135 tablet; Refill: 1  4. Sinus congestion .  Controlled.  Stable.  Continue Nasacort on a daily basis particularly during the allergy season. - triamcinolone (NASACORT) 55 MCG/ACT AERO nasal  inhaler; Place 2 sprays into the nose daily.  Dispense: 1 each; Refill: 12     Elizabeth Sauer, MD

## 2022-07-29 LAB — LIPID PANEL WITH LDL/HDL RATIO
Cholesterol, Total: 148 mg/dL (ref 100–199)
HDL: 73 mg/dL (ref >39)
LDL Chol Calc (NIH): 63 mg/dL (ref 0–99)
LDL/HDL Ratio: 0.9 ratio (ref 0.0–3.2)
Triglycerides: 58 mg/dL (ref 0–149)
VLDL Cholesterol Cal: 12 mg/dL (ref 5–40)

## 2022-07-29 LAB — COMPREHENSIVE METABOLIC PANEL
ALT: 29 IU/L (ref 0–32)
AST: 25 IU/L (ref 0–40)
Albumin/Globulin Ratio: 3 — ABNORMAL HIGH (ref 1.2–2.2)
Albumin: 4.5 g/dL (ref 3.9–4.9)
Alkaline Phosphatase: 49 IU/L (ref 44–121)
BUN/Creatinine Ratio: 30 — ABNORMAL HIGH (ref 12–28)
BUN: 24 mg/dL (ref 8–27)
Bilirubin Total: 0.4 mg/dL (ref 0.0–1.2)
CO2: 26 mmol/L (ref 20–29)
Calcium: 9.4 mg/dL (ref 8.7–10.3)
Chloride: 106 mmol/L (ref 96–106)
Creatinine, Ser: 0.8 mg/dL (ref 0.57–1.00)
Globulin, Total: 1.5 g/dL (ref 1.5–4.5)
Glucose: 63 mg/dL — ABNORMAL LOW (ref 70–99)
Potassium: 4.5 mmol/L (ref 3.5–5.2)
Sodium: 143 mmol/L (ref 134–144)
Total Protein: 6 g/dL (ref 6.0–8.5)
eGFR: 80 mL/min/{1.73_m2} (ref >59)

## 2022-08-27 DIAGNOSIS — K5901 Slow transit constipation: Secondary | ICD-10-CM | POA: Diagnosis not present

## 2022-08-27 DIAGNOSIS — N644 Mastodynia: Secondary | ICD-10-CM | POA: Diagnosis not present

## 2022-09-15 DIAGNOSIS — F5105 Insomnia due to other mental disorder: Secondary | ICD-10-CM | POA: Diagnosis not present

## 2022-09-15 DIAGNOSIS — F411 Generalized anxiety disorder: Secondary | ICD-10-CM | POA: Diagnosis not present

## 2022-11-05 DIAGNOSIS — Z85828 Personal history of other malignant neoplasm of skin: Secondary | ICD-10-CM | POA: Diagnosis not present

## 2022-11-05 DIAGNOSIS — D2272 Melanocytic nevi of left lower limb, including hip: Secondary | ICD-10-CM | POA: Diagnosis not present

## 2022-11-05 DIAGNOSIS — D2271 Melanocytic nevi of right lower limb, including hip: Secondary | ICD-10-CM | POA: Diagnosis not present

## 2022-11-05 DIAGNOSIS — Z872 Personal history of diseases of the skin and subcutaneous tissue: Secondary | ICD-10-CM | POA: Diagnosis not present

## 2022-11-05 DIAGNOSIS — D2261 Melanocytic nevi of right upper limb, including shoulder: Secondary | ICD-10-CM | POA: Diagnosis not present

## 2022-11-05 DIAGNOSIS — D2262 Melanocytic nevi of left upper limb, including shoulder: Secondary | ICD-10-CM | POA: Diagnosis not present

## 2022-11-23 ENCOUNTER — Other Ambulatory Visit: Payer: Self-pay | Admitting: Internal Medicine

## 2022-11-23 DIAGNOSIS — R079 Chest pain, unspecified: Secondary | ICD-10-CM

## 2022-11-23 DIAGNOSIS — C801 Malignant (primary) neoplasm, unspecified: Secondary | ICD-10-CM | POA: Diagnosis not present

## 2022-11-23 DIAGNOSIS — I639 Cerebral infarction, unspecified: Secondary | ICD-10-CM | POA: Diagnosis not present

## 2022-11-23 DIAGNOSIS — R0789 Other chest pain: Secondary | ICD-10-CM | POA: Diagnosis not present

## 2022-11-23 DIAGNOSIS — I2089 Other forms of angina pectoris: Secondary | ICD-10-CM | POA: Diagnosis not present

## 2022-11-23 DIAGNOSIS — G4733 Obstructive sleep apnea (adult) (pediatric): Secondary | ICD-10-CM | POA: Diagnosis not present

## 2022-11-23 DIAGNOSIS — E782 Mixed hyperlipidemia: Secondary | ICD-10-CM | POA: Diagnosis not present

## 2022-11-23 DIAGNOSIS — R42 Dizziness and giddiness: Secondary | ICD-10-CM | POA: Diagnosis not present

## 2022-11-24 ENCOUNTER — Encounter (HOSPITAL_COMMUNITY): Payer: Self-pay

## 2022-11-24 ENCOUNTER — Other Ambulatory Visit (HOSPITAL_COMMUNITY): Payer: Self-pay | Admitting: Emergency Medicine

## 2022-11-24 DIAGNOSIS — R079 Chest pain, unspecified: Secondary | ICD-10-CM

## 2022-11-24 MED ORDER — METOPROLOL TARTRATE 50 MG PO TABS: 50.0000 mg | ORAL_TABLET | Freq: Once | ORAL | 0 refills | Status: DC

## 2022-11-29 ENCOUNTER — Ambulatory Visit
Admission: RE | Admit: 2022-11-29 | Discharge: 2022-11-29 | Disposition: A | Discharge status: Home / Self Care | Payer: Medicare Other | Source: Ambulatory Visit | Attending: Internal Medicine | Admitting: Internal Medicine

## 2022-11-29 DIAGNOSIS — I2089 Other forms of angina pectoris: Secondary | ICD-10-CM | POA: Insufficient documentation

## 2022-11-29 DIAGNOSIS — R079 Chest pain, unspecified: Secondary | ICD-10-CM | POA: Insufficient documentation

## 2022-11-29 MED ORDER — NITROGLYCERIN 0.4 MG SL SUBL
SUBLINGUAL_TABLET | SUBLINGUAL | Status: AC
  Filled 2022-11-29: qty 1

## 2022-11-29 MED ORDER — NITROGLYCERIN 0.4 MG SL SUBL
0.8000 mg | SUBLINGUAL_TABLET | Freq: Once | SUBLINGUAL | Status: AC
  Administered 2022-11-29: 0.4 mg via SUBLINGUAL

## 2022-11-29 MED ORDER — IOHEXOL 350 MG/ML SOLN
100.0000 mL | Freq: Once | INTRAVENOUS | Status: AC | PRN
  Administered 2022-11-29: 100 mL via INTRAVENOUS

## 2022-11-29 NOTE — Progress Notes (Signed)

## 2022-12-06 ENCOUNTER — Other Ambulatory Visit: Payer: Self-pay | Admitting: Family Medicine

## 2022-12-06 DIAGNOSIS — E782 Mixed hyperlipidemia: Secondary | ICD-10-CM

## 2022-12-16 DIAGNOSIS — R0789 Other chest pain: Secondary | ICD-10-CM | POA: Diagnosis not present

## 2022-12-16 DIAGNOSIS — I2089 Other forms of angina pectoris: Secondary | ICD-10-CM | POA: Diagnosis not present

## 2022-12-31 DIAGNOSIS — F411 Generalized anxiety disorder: Secondary | ICD-10-CM | POA: Diagnosis not present

## 2022-12-31 DIAGNOSIS — F5105 Insomnia due to other mental disorder: Secondary | ICD-10-CM | POA: Diagnosis not present

## 2023-01-05 DIAGNOSIS — Z853 Personal history of malignant neoplasm of breast: Secondary | ICD-10-CM | POA: Diagnosis not present

## 2023-01-05 DIAGNOSIS — Z01411 Encounter for gynecological examination (general) (routine) with abnormal findings: Secondary | ICD-10-CM | POA: Diagnosis not present

## 2023-01-05 DIAGNOSIS — Z1331 Encounter for screening for depression: Secondary | ICD-10-CM | POA: Diagnosis not present

## 2023-01-18 DIAGNOSIS — Z853 Personal history of malignant neoplasm of breast: Secondary | ICD-10-CM | POA: Diagnosis not present

## 2023-01-18 DIAGNOSIS — Z1239 Encounter for other screening for malignant neoplasm of breast: Secondary | ICD-10-CM | POA: Diagnosis not present

## 2023-01-18 DIAGNOSIS — T8549XA Other mechanical complication of breast prosthesis and implant, initial encounter: Secondary | ICD-10-CM | POA: Diagnosis not present

## 2023-01-19 ENCOUNTER — Ambulatory Visit: Payer: Medicare Other

## 2023-01-19 DIAGNOSIS — Z0000 Encounter for general adult medical examination without abnormal findings: Secondary | ICD-10-CM | POA: Diagnosis not present

## 2023-01-19 DIAGNOSIS — Z780 Asymptomatic menopausal state: Secondary | ICD-10-CM

## 2023-01-19 NOTE — Progress Notes (Signed)
Subjective:   Amanda Osborn is a 67 y.o. female who presents for Medicare Annual (Subsequent) preventive examination.  Visit Complete: Virtual I connected with  Amanda Osborn on 01/19/23 by a audio enabled telemedicine application and verified that I am speaking with the correct person using two identifiers.  Patient Location: Home  Provider Location: Office/Clinic  I discussed the limitations of evaluation and management by telemedicine. The patient expressed understanding and agreed to proceed.  Vital Signs: Because this visit was a virtual/telehealth visit, some criteria may be missing or patient reported. Any vitals not documented were not able to be obtained and vitals that have been documented are patient reported.   Cardiac Risk Factors include: advanced age (>57men, >57 women);dyslipidemia     Objective:    There were no vitals filed for this visit. There is no height or weight on file to calculate BMI.     01/19/2023   10:09 AM 01/07/2021    2:54 PM 01/07/2020    3:01 PM 09/21/2019    7:47 AM 09/13/2019   10:49 AM 12/28/2017    5:36 PM 12/28/2017    3:11 PM  Advanced Directives  Does Patient Have a Medical Advance Directive? No Yes Yes Yes No Yes No  Type of Advance Directive  Living will Healthcare Power of [place];Living will Living will  Living will   Does patient want to make changes to medical advance directive?      No - Patient declined   Copy of Healthcare Power of Attorney in Chart?   No - copy requested      Would patient like information on creating a medical advance directive? No - Patient declined          Current Medications (verified) Outpatient Encounter Medications as of 01/19/2023  Medication Sig   acyclovir (ZOVIRAX) 400 MG tablet Take 400 mg by mouth 2 (two) times daily as needed (fever blister).    aspirin EC 325 MG EC tablet Take 1 tablet (325 mg total) by mouth daily.   buPROPion (WELLBUTRIN SR) 100 MG 12 hr tablet Take 100 mg by  mouth every morning.    Multiple Vitamin (MULTIVITAMIN WITH MINERALS) TABS tablet Take 1 tablet by mouth daily. Alive Multivitamin   rosuvastatin (CRESTOR) 20 MG tablet TAKE 1 TABLET BY MOUTH EVERYDAY AT BEDTIME   sertraline (ZOLOFT) 50 MG tablet Take 1 tablet (50 mg total) by mouth daily.   traZODone (DESYREL) 50 MG tablet Take 1.5 tablets (75 mg total) by mouth at bedtime.   triamcinolone (NASACORT) 55 MCG/ACT AERO nasal inhaler Place 2 sprays into the nose daily.   Cholecalciferol (VITAMIN D-3) 125 MCG (5000 UT) TABS Take 5,000 Units by mouth daily.  (Patient not taking: Reported on 01/19/2023)   metoprolol tartrate (LOPRESSOR) 50 MG tablet Take 1 tablet (50 mg total) by mouth once for 1 dose. Take 90-120 minutes prior to scan. Hold for SBP less than 110.   No facility-administered encounter medications on file as of 01/19/2023.    Allergies (verified) Montelukast sodium   History: Past Medical History:  Diagnosis Date   Allergy    Anxiety    Arthritis    Breast cancer (HCC) 2006   BIL BREASTS-LOBULAR CARCINOMA IN SITU   Dizziness    Headache    History of CVA (cerebrovascular accident)    Hyperlipidemia    Osteoporosis    Stroke (HCC) 2019   NO RESIDUAL EFFECTS   Past Surgical History:  Procedure Laterality Date  AUGMENTATION MAMMAPLASTY Bilateral 2011   BREAST CYST ASPIRATION Bilateral    BREAST EXCISIONAL BIOPSY Left 2006   breast ca   BREAST LUMPECTOMY     CARDIAC CATHETERIZATION     COLONOSCOPY     COLONOSCOPY WITH PROPOFOL N/A 06/28/2016   Procedure: COLONOSCOPY WITH PROPOFOL;  Surgeon: Christena Deem, MD;  Location: [facility] ENDOSCOPY;  Service: Endoscopy;  Laterality: N/A;   CYSTOSCOPY W/ URETERAL STENT PLACEMENT Left 09/21/2019   Procedure: CYSTOSCOPY WITH RETROGRADE PYELOGRAM/URETERAL STENT PLACEMENT;  Surgeon: Sondra Come, MD;  Location: [facility] ORS;  Service: Urology;  Laterality: Left;   LYMPH NODE BIOPSY     NECK   MASTECTOMY Bilateral    TEE WITHOUT  CARDIOVERSION N/A 12/30/2017   Procedure: TRANSESOPHAGEAL ECHOCARDIOGRAM (TEE);  Surgeon: Dalia Heading, MD;  Location: [facility] ORS;  Service: Cardiovascular;  Laterality: N/A;   TOTAL VAGINAL HYSTERECTOMY     URETEROSCOPY Left 09/21/2019   Procedure: URETEROSCOPY-DIAGNOSTIC;  Surgeon: Sondra Come, MD;  Location: [facility] ORS;  Service: Urology;  Laterality: Left;   VAGINAL HYSTERECTOMY     Family History  Problem Relation Age of Onset   Breast cancer Paternal Aunt    Breast cancer Paternal Grandmother    Dementia Mother        passed at age 46   Leukemia Father        passed at age 71   Leukemia Brother    Social History   Socioeconomic History   Marital status: Married    Spouse name: Amanda Osborn   Number of children: 2   Years of education: college   Highest education level: Bachelor's degree (e.g., BA, AB, BS)  Occupational History   Occupation: retired from [employer], industrial/product work  Tobacco Use   Smoking status: Never   Smokeless tobacco: Never  Vaping Use   Vaping status: Never Used  Substance and Sexual Activity   Alcohol use: No    Alcohol/week: 0.0 standard drinks of alcohol   Drug use: No   Sexual activity: Yes  Other Topics Concern   Not on file  Social History Narrative   Lives at home with her husband, Amanda Osborn.   Right-handed.   No caffeine use.   Social Determinants of Health   Financial Resource Strain: Low Risk  (01/19/2023)   Overall Financial Resource Strain (CARDIA)    Difficulty of Paying Living Expenses: Not hard at all  Food Insecurity: No Food Insecurity (01/19/2023)   Hunger Vital Sign    Worried About Running Out of Food in the Last Year: Never true    Ran Out of Food in the Last Year: Never true  Transportation Needs: No Transportation Needs (01/19/2023)   PRAPARE - Administrator, Civil Service (Medical): No    Lack of Transportation (Non-Medical): No  Physical Activity: Insufficiently Active (01/19/2023)   Exercise Vital Sign     Days of Exercise per Week: 2 days    Minutes of Exercise per Session: 30 min  Stress: No Stress Concern Present (01/19/2023)   [employer] of Occupational Health - Occupational Stress Questionnaire    Feeling of Stress : Only a little  Social Connections: Moderately Integrated (01/19/2023)   Social Connection and Isolation Panel [NHANES]    Frequency of Communication with Friends and Family: More than three times a week    Frequency of Social Gatherings with Friends and Family: Never    Attends Religious Services: More than 4 times per year    Active Member of  Clubs or Organizations: No    Attends Engineer, structural: Never    Marital Status: Married    Tobacco Counseling Counseling given: Not Answered   Clinical Intake:  Pre-visit preparation completed: Yes  Pain : No/denies pain     Nutritional Status: BMI <19  Underweight Nutritional Risks: None Diabetes: No  How often do you need to have someone help you when you read instructions, pamphlets, or other written materials from your doctor or pharmacy?: 1 - Never  Interpreter Needed?: No  Information entered by :: Kennedy Bucker, LPN   Activities of Daily Living    01/19/2023   10:10 AM  In your present state of health, do you have any difficulty performing the following activities:  Hearing? 0  Vision? 0  Difficulty concentrating or making decisions? 0  Walking or climbing stairs? 0  Dressing or bathing? 0  Doing errands, shopping? 0  Preparing Food and eating ? N  Using the Toilet? N  In the past six months, have you accidently leaked urine? N  Do you have problems with loss of bowel control? N  Managing your Medications? N  Managing your Finances? N  Housekeeping or managing your Housekeeping? N    Patient Care Team: Duanne Limerick, MD as PCP - General (Family Medicine) Schermerhorn, Ihor Austin, MD as Referring Physician (Obstetrics and Gynecology)  Indicate any recent Medical Services you  may have received from other than [facility] providers in the past year (date may be approximate).     Assessment:   This is a routine wellness examination for Amanda Osborn.  Hearing/Vision screen Hearing Screening - Comments:: No aids Vision Screening - Comments:: Readers- [facility]   Goals Addressed             This Visit's Progress    DIET - EAT MORE FRUITS AND VEGETABLES         Depression Screen    01/19/2023   10:07 AM 07/28/2022   10:08 AM 02/19/2022    8:26 AM 02/09/2022    2:35 PM 01/26/2022    9:40 AM 01/15/2022    3:34 PM 07/22/2021   11:10 AM  PHQ 2/9 Scores  PHQ - 2 Score 1 0 0 0 0 0 0  PHQ- 9 Score 2 0 0 0 2  0    Fall Risk    01/19/2023   10:10 AM 07/28/2022   10:08 AM 02/19/2022    8:26 AM 02/09/2022    2:35 PM 01/26/2022    9:40 AM  Fall Risk   Falls in the past year? 0 0 0 0 0  Number falls in past yr: 0 0 0 0 0  Injury with Fall? 0 0 0 0 0  Risk for fall due to : No Fall Risks No Fall Risks No Fall Risks No Fall Risks No Fall Risks  Follow up Falls prevention discussed;Falls evaluation completed Falls evaluation completed Falls evaluation completed Falls evaluation completed Falls evaluation completed    MEDICARE RISK AT HOME: Medicare Risk at Home Any stairs in or around the home?: Yes If so, are there any without handrails?: No Home free of loose throw rugs in walkways, pet beds, electrical cords, etc?: Yes Adequate lighting in your home to reduce risk of falls?: Yes Life alert?: No Use of a cane, walker or w/c?: No Grab bars in the bathroom?: No Shower chair or bench in shower?: Yes Elevated toilet seat or a handicapped toilet?: Yes  TIMED UP AND GO:  Was the test performed?  No    Cognitive Function:        01/19/2023   10:11 AM 01/15/2022    3:35 PM 01/07/2020    3:03 PM  6CIT Screen  What Year? 0 points 0 points 0 points  What month? 0 points 0 points 0 points  What time? 0 points 0 points 0 points  Count back from 20 0 points 2  points 0 points  Months in reverse 0 points 0 points 0 points  Repeat phrase 0 points 2 points 0 points  Total Score 0 points 4 points 0 points    Immunizations Immunization History  Administered Date(s) Administered   Fluad Quad(high Dose 65+) 12/21/2018, 01/21/2021   Influenza,inj,Quad PF,6+ Mos 12/29/2017   Influenza-Unspecified 01/11/2016, 12/29/2017   PFIZER(Purple Top)SARS-COV-2 Vaccination 05/28/2019, 06/18/2019, 03/20/2020   PNEUMOCOCCAL CONJUGATE-20 08/08/2020   Pneumococcal Conjugate-13 12/28/2018    TDAP status: Due, Education has been provided regarding the importance of this vaccine. Advised may receive this vaccine at local pharmacy or Health Dept. Aware to provide a copy of the vaccination record if obtained from local pharmacy or Health Dept. Verbalized acceptance and understanding.  Flu Vaccine status: Due, Education has been provided regarding the importance of this vaccine. Advised may receive this vaccine at local pharmacy or Health Dept. Aware to provide a copy of the vaccination record if obtained from local pharmacy or Health Dept. Verbalized acceptance and understanding.  Pneumococcal vaccine status: Up to date  Covid-19 vaccine status: Completed vaccines  Qualifies for Shingles Vaccine? Yes   Zostavax completed No   Shingrix Completed?: No.    Education has been provided regarding the importance of this vaccine. Patient has been advised to call insurance company to determine out of pocket expense if they have not yet received this vaccine. Advised may also receive vaccine at local pharmacy or Health Dept. Verbalized acceptance and understanding.  Screening Tests Health Maintenance  Topic Date Due   Hepatitis C Screening  Never done   Zoster Vaccines- Shingrix (1 of 2) Never done   INFLUENZA VACCINE  11/11/2022   MAMMOGRAM  03/26/2023   Medicare Annual Wellness (AWV)  01/19/2024   Colonoscopy  06/29/2026   Pneumonia Vaccine 6+ Years old  Completed    DEXA SCAN  Completed   HPV VACCINES  Aged Out   DTaP/Tdap/Td  Discontinued   COVID-19 Vaccine  Discontinued    Health Maintenance  Health Maintenance Due  Topic Date Due   Hepatitis C Screening  Never done   Zoster Vaccines- Shingrix (1 of 2) Never done   INFLUENZA VACCINE  11/11/2022    Colorectal cancer screening: Type of screening: Colonoscopy. Completed 06/28/16. Repeat every 10 years  Mammogram status: Completed 01/18/23. Repeat every year- had double mastectomy so had [facility]  Bone Density status: Completed 03/07/19. Results reflect: Bone density results: OSTEOPOROSIS. Repeat every 2 years.  Lung Cancer Screening: (Low Dose CT Chest recommended if Age 49-80 years, 20 pack-year currently smoking OR have quit w/in 15years.) does not qualify.    Additional Screening:  Hepatitis C Screening: does qualify; Completed no  Vision Screening: Recommended annual ophthalmology exams for early detection of glaucoma and other disorders of the eye. Is the patient up to date with their annual eye exam?  Yes  Who is the provider or what is the name of the office in which the patient attends annual eye exams? [facility] If pt is not established with a provider, would they like  to be referred to a provider to establish care? No .   Dental Screening: Recommended annual dental exams for proper oral hygiene  Community Resource Referral / Chronic Care Management: CRR required this visit?  No   CCM required this visit?  No     Plan:     I have personally reviewed and noted the following in the patient's chart:   Medical and social history Use of alcohol, tobacco or illicit drugs  Current medications and supplements including opioid prescriptions. Patient is not currently taking opioid prescriptions. Functional ability and status Nutritional status Physical activity Advanced directives List of other physicians Hospitalizations, surgeries, and ER visits in previous  12 months Vitals Screenings to include cognitive, depression, and falls Referrals and appointments  In addition, I have reviewed and discussed with patient certain preventive protocols, quality metrics, and best practice recommendations. A written personalized care plan for preventive services as well as general preventive health recommendations were provided to patient.     Hal Hope, LPN   52/11/[id number]   After Visit Summary: (MyChart) Due to this being a telephonic visit, the after visit summary with patients personalized plan was offered to patient via MyChart   Nurse Notes: none

## 2023-01-19 NOTE — Patient Instructions (Addendum)
Amanda Osborn , Thank you for taking time to come for your Medicare Wellness Visit. I appreciate your ongoing commitment to your health goals. Please review the following plan we discussed and let me know if I can assist you in the future.   Referrals/Orders/Follow-Ups/Clinician Recommendations: none- bone density scan ordered  This is a list of the screening recommended for you and due dates:  Health Maintenance  Topic Date Due   Hepatitis C Screening  Never done   Zoster (Shingles) Vaccine (1 of 2) Never done   Flu Shot  11/11/2022   Mammogram  03/26/2023   Medicare Annual Wellness Visit  01/19/2024   Colon Cancer Screening  06/29/2026   Pneumonia Vaccine  Completed   DEXA scan (bone density measurement)  Completed   HPV Vaccine  Aged Out   DTaP/Tdap/Td vaccine  Discontinued   COVID-19 Vaccine  Discontinued    Advanced directives: (ACP Link)Information on Advanced Care Planning can be found at [facility] Advance Health Care Directives Advance Health Care Directives ([email or web address]   Next Medicare Annual Wellness Visit scheduled for next year: Yes   01/25/24 @ 10:20 am by video

## 2023-01-24 DIAGNOSIS — F411 Generalized anxiety disorder: Secondary | ICD-10-CM | POA: Diagnosis not present

## 2023-01-24 DIAGNOSIS — F5105 Insomnia due to other mental disorder: Secondary | ICD-10-CM | POA: Diagnosis not present

## 2023-01-28 ENCOUNTER — Encounter: Payer: Self-pay | Admitting: Family Medicine

## 2023-01-28 ENCOUNTER — Ambulatory Visit (INDEPENDENT_AMBULATORY_CARE_PROVIDER_SITE_OTHER): Payer: Medicare Other | Admitting: Family Medicine

## 2023-01-28 VITALS — BP 128/84 | HR 71 | Ht 63.0 in | Wt 106.0 lb

## 2023-01-28 DIAGNOSIS — G44209 Tension-type headache, unspecified, not intractable: Secondary | ICD-10-CM

## 2023-01-28 DIAGNOSIS — R5383 Other fatigue: Secondary | ICD-10-CM | POA: Diagnosis not present

## 2023-01-28 DIAGNOSIS — E782 Mixed hyperlipidemia: Secondary | ICD-10-CM | POA: Diagnosis not present

## 2023-01-28 DIAGNOSIS — H8110 Benign paroxysmal vertigo, unspecified ear: Secondary | ICD-10-CM | POA: Diagnosis not present

## 2023-01-28 MED ORDER — MECLIZINE HCL 12.5 MG PO TABS
12.5000 mg | ORAL_TABLET | Freq: Three times a day (TID) | ORAL | 0 refills | Status: DC | PRN
Start: 2023-01-28 — End: 2023-03-01

## 2023-01-28 MED ORDER — ROSUVASTATIN CALCIUM 20 MG PO TABS: 20.0000 mg | ORAL_TABLET | Freq: Every day | ORAL | 0 refills | Status: DC

## 2023-01-28 NOTE — Patient Instructions (Signed)
Tension Headache, Adult A tension headache is a feeling of pain, pressure, or aching in the head. It is often felt over the front and sides of the head. Tension headaches can last from 30 minutes to several days. What are the causes? The cause of this condition is not known. Sometimes, tension headaches are brought on by stress, worry (anxiety), or depression. Other things that may set them off include: Alcohol. Too much caffeine or caffeine withdrawal. Colds, flu, or sinus infections. Dental problems. This can include clenching your teeth. Being tired. Holding your head and neck in the same position for a long time, such as while using a computer. Smoking. Arthritis in the neck. What are the signs or symptoms? Feeling pressure around the head. A dull ache in the head. Pain over the front and sides of the head. Feeling sore or tender in the muscles of the head, neck, and shoulders. How is this treated? This condition may be treated with lifestyle changes and with medicines that help relieve symptoms. Follow these instructions at home: Managing pain Take over-the-counter and prescription medicines only as told by your doctor. When you have a headache, lie down in a dark, quiet room. If told, put ice on your head and neck. To do this: Put ice in a plastic bag. Place a towel between your skin and the bag. Leave the ice on for 20 minutes, 2-3 times a day. Take off the ice if your skin turns bright red. This is very important. If you cannot feel pain, heat, or cold, you have a greater risk of damage to the area. If told, put heat on the back of your neck. Do this as often as told by your doctor. Use the heat source that your doctor recommends, such as a moist heat pack or a heating pad. Place a towel between your skin and the heat source. Leave the heat on for 20-30 minutes. Take off the heat if your skin turns bright red. This is very important. If you cannot feel pain, heat, or cold, you  have a greater risk of getting burned. Eating and drinking Eat meals on a regular schedule. If you drink alcohol: Limit how much you have to: 0-1 drink a day for women who are not pregnant. 0-2 drinks a day for men. Know how much alcohol is in your drink. In the [place] one drink equals one 12 oz bottle of beer (355 mL), one 5 oz glass of wine (148 mL), or one 1 oz glass of hard liquor (44 mL). Drink enough fluid to keep your pee (urine) pale yellow. Do not use a lot of caffeine, or stop using caffeine. Lifestyle Get 7-9 hours of sleep each night. Or get the amount of sleep that your doctor tells you to. At bedtime, keep computers, phones, and tablets out of your room. Find ways to lessen your stress. This may include: Exercise. Deep breathing. Yoga. Listening to music. Thinking positive thoughts. Sit up straight. Try to relax your muscles. Do not smoke or use any products that contain nicotine or tobacco. If you need help quitting, ask your doctor. General instructions  Avoid things that can bring on headaches. Keep a headache journal to see what may bring on headaches. For example, write down: What you eat and drink. How much sleep you get. Any change to your diet or medicines. Keep all follow-up visits. Contact a doctor if: Your headache does not get better. Your headache comes back. You have a headache, and sounds,  light, or smells bother you. You feel like you may vomit, or you vomit. Your stomach hurts. Get help right away if: You all of a sudden get a very bad headache with any of these things: A stiff neck. Feeling like you may vomit. Vomiting. Feeling mixed up (confused). Feeling weak in one part or one side of your body. Having trouble seeing or speaking, or both. Feeling short of breath. A rash. Feeling very sleepy. Pain in your eye or ear. Trouble walking or balancing. Feeling like you will faint, or you faint. Summary A tension headache is pain, pressure,  or aching in your head. Tension headaches can last from 30 minutes to several days. Lifestyle changes and medicines may help relieve pain. This information is not intended to replace advice given to you by your health care provider. Make sure you discuss any questions you have with your health care provider. Document Revised: 12/27/2019 Document Reviewed: 12/27/2019 Elsevier Patient Education  2024 [employer].

## 2023-01-28 NOTE — Progress Notes (Signed)
Date:  01/28/2023   Name:  Amanda Osborn   DOB:  1955-11-23   MRN:  [id number]   Chief Complaint: No chief complaint on file.  Headache  This is a new problem. The current episode started more than 1 month ago. The problem occurs intermittently. The problem has been waxing and waning. The pain radiates to the lower back. The quality of the pain is described as aching. The pain is moderate. Associated symptoms include dizziness and nausea. Pertinent negatives include no abdominal pain, abnormal behavior, anorexia, ear pain, facial sweating, fever, hearing loss, numbness, photophobia, scalp tenderness, seizures, tingling or weight loss. She has tried nothing for the symptoms. The treatment provided mild relief.  Thyroid Problem Presents for follow-up visit. Symptoms include constipation, diaphoresis and fatigue. Patient reports no cold intolerance, depressed mood, diarrhea, dry skin, hair loss, heat intolerance, nail problem, palpitations, weight gain or weight loss. The symptoms have been improving.  Dizziness This is a new problem. The current episode started in the past 7 days. The problem occurs intermittently. Associated symptoms include congestion, diaphoresis, fatigue, headaches, nausea and vertigo. Pertinent negatives include no abdominal pain, anorexia, chest pain, chills, fever or numbness.    Lab Results  Component Value Date   NA 143 07/28/2022   K 4.5 07/28/2022   CO2 26 07/28/2022   GLUCOSE 63 (L) 07/28/2022   BUN 24 07/28/2022   CREATININE 0.80 07/28/2022   CALCIUM 9.4 07/28/2022   EGFR 80 07/28/2022   GFRNONAA >60 09/13/2019   Lab Results  Component Value Date   CHOL 148 07/28/2022   HDL 73 07/28/2022   LDLCALC 63 07/28/2022   TRIG 58 07/28/2022   CHOLHDL 2.1 05/17/2018   Lab Results  Component Value Date   TSH 1.670 07/22/2021   Lab Results  Component Value Date   HGBA1C 5.3 12/29/2017   Lab Results  Component Value Date   WBC 4.6 07/22/2021   HGB  13.0 07/22/2021   HCT 37.6 07/22/2021   MCV 93 07/22/2021   PLT 166 07/22/2021   Lab Results  Component Value Date   ALT 29 07/28/2022   AST 25 07/28/2022   ALKPHOS 49 07/28/2022   BILITOT 0.4 07/28/2022   No results found for: "25OHVITD2", "25OHVITD3", "VD25OH"   Review of Systems  Constitutional:  Positive for diaphoresis and fatigue. Negative for chills, fever, weight gain and weight loss.  HENT:  Positive for congestion. Negative for ear pain and hearing loss.   Eyes:  Negative for photophobia.  Cardiovascular:  Negative for chest pain and palpitations.  Gastrointestinal:  Positive for constipation and nausea. Negative for abdominal pain, anorexia and diarrhea.  Endocrine: Negative for cold intolerance and heat intolerance.  Neurological:  Positive for dizziness, vertigo and headaches. Negative for tingling, seizures and numbness.    Patient Active Problem List   Diagnosis Date Noted   Pelvic pain in female 08/30/2019   Dizziness 03/21/2019   CVA (cerebral vascular accident) (HCC) 12/29/2017   Slurred speech 12/28/2017   Malignant neoplastic disease (HCC) 03/18/2015   Chondromalacia of patella 09/13/2014   Bursitis 09/13/2014   Allergic rhinitis 09/13/2014   Cancer (HCC) 09/13/2014   Cervical disc disease 09/13/2014    Allergies  Allergen Reactions   Montelukast Sodium     Caused headaches    Past Surgical History:  Procedure Laterality Date   AUGMENTATION MAMMAPLASTY Bilateral 2011   BREAST CYST ASPIRATION Bilateral    BREAST EXCISIONAL BIOPSY Left 2006   breast ca  BREAST LUMPECTOMY     CARDIAC CATHETERIZATION     COLONOSCOPY     COLONOSCOPY WITH PROPOFOL N/A 06/28/2016   Procedure: COLONOSCOPY WITH PROPOFOL;  Surgeon: Christena Deem, MD;  Location: [facility] ENDOSCOPY;  Service: Endoscopy;  Laterality: N/A;   CYSTOSCOPY W/ URETERAL STENT PLACEMENT Left 09/21/2019   Procedure: CYSTOSCOPY WITH RETROGRADE PYELOGRAM/URETERAL STENT PLACEMENT;  Surgeon:  Sondra Come, MD;  Location: [facility] ORS;  Service: Urology;  Laterality: Left;   LYMPH NODE BIOPSY     NECK   MASTECTOMY Bilateral    TEE WITHOUT CARDIOVERSION N/A 12/30/2017   Procedure: TRANSESOPHAGEAL ECHOCARDIOGRAM (TEE);  Surgeon: Dalia Heading, MD;  Location: [facility] ORS;  Service: Cardiovascular;  Laterality: N/A;   TOTAL VAGINAL HYSTERECTOMY     URETEROSCOPY Left 09/21/2019   Procedure: URETEROSCOPY-DIAGNOSTIC;  Surgeon: Sondra Come, MD;  Location: [facility] ORS;  Service: Urology;  Laterality: Left;   VAGINAL HYSTERECTOMY      Social History   Tobacco Use   Smoking status: Never   Smokeless tobacco: Never  Vaping Use   Vaping status: Never Used  Substance Use Topics   Alcohol use: No    Alcohol/week: 0.0 standard drinks of alcohol   Drug use: No     Medication list has been reviewed and updated.  Current Meds  Medication Sig   acyclovir (ZOVIRAX) 400 MG tablet Take 400 mg by mouth 2 (two) times daily as needed (fever blister).    aspirin EC 325 MG EC tablet Take 1 tablet (325 mg total) by mouth daily.   buPROPion (WELLBUTRIN SR) 100 MG 12 hr tablet Take 100 mg by mouth every morning.    Cholecalciferol (VITAMIN D-3) 125 MCG (5000 UT) TABS Take 5,000 Units by mouth daily.   Multiple Vitamin (MULTIVITAMIN WITH MINERALS) TABS tablet Take 1 tablet by mouth daily. Alive Multivitamin   rosuvastatin (CRESTOR) 20 MG tablet TAKE 1 TABLET BY MOUTH EVERYDAY AT BEDTIME   sertraline (ZOLOFT) 50 MG tablet Take 1 tablet (50 mg total) by mouth daily.   traZODone (DESYREL) 50 MG tablet Take 1.5 tablets (75 mg total) by mouth at bedtime.   triamcinolone (NASACORT) 55 MCG/ACT AERO nasal inhaler Place 2 sprays into the nose daily.       07/28/2022   10:08 AM 02/19/2022    8:26 AM 02/09/2022    2:35 PM 01/26/2022    9:40 AM  GAD 7 : Generalized Anxiety Score  Nervous, Anxious, on Edge 0 0 0 0  Control/stop worrying 0 0 0 0  Worry too much - different things 0 0 0 0  Trouble  relaxing 0 0 0 0  Restless 0 0 0 0  Easily annoyed or irritable 0 0 0 0  Afraid - awful might happen 0 0 0 0  Total GAD 7 Score 0 0 0 0  Anxiety Difficulty Not difficult at all Not difficult at all Not difficult at all Not difficult at all       01/19/2023   10:07 AM 07/28/2022   10:08 AM 02/19/2022    8:26 AM  Depression screen PHQ 2/9  Decreased Interest 0 0 0  Down, Depressed, Hopeless 1 0 0  PHQ - 2 Score 1 0 0  Altered sleeping 0 0 0  Tired, decreased energy 1 0 0  Change in appetite 0 0 0  Feeling bad or failure about yourself  0 0 0  Trouble concentrating 0 0 0  Moving slowly or fidgety/restless 0 0 0  Suicidal thoughts 0 0 0  PHQ-9 Score 2 0 0  Difficult doing work/chores Not difficult at all Not difficult at all Not difficult at all    BP Readings from Last 3 Encounters:  01/28/23 128/84  11/29/22 107/82  07/28/22 110/72    Physical Exam Vitals and nursing note reviewed. Exam conducted with a chaperone present.  Constitutional:      General: She is not in acute distress.    Appearance: She is not diaphoretic.  HENT:     Head: Normocephalic and atraumatic.     Right Ear: External ear normal.     Left Ear: External ear normal.     Nose: Nose normal.  Eyes:     General:        Right eye: No discharge.        Left eye: No discharge.     Conjunctiva/sclera: Conjunctivae normal.     Pupils: Pupils are equal, round, and reactive to light.  Neck:     Thyroid: No thyromegaly.     Vascular: No JVD.  Cardiovascular:     Rate and Rhythm: Normal rate and regular rhythm.     Heart sounds: Normal heart sounds. No murmur heard.    No friction rub. No gallop.  Pulmonary:     Effort: Pulmonary effort is normal.     Breath sounds: Normal breath sounds.  Abdominal:     General: Bowel sounds are normal.     Palpations: Abdomen is soft. There is no mass.     Tenderness: There is no abdominal tenderness. There is no guarding.  Musculoskeletal:        General: Normal  range of motion.     Cervical back: Normal range of motion and neck supple.  Lymphadenopathy:     Cervical: No cervical adenopathy.  Skin:    General: Skin is warm and dry.  Neurological:     Mental Status: She is alert.     Cranial Nerves: Cranial nerves 2-12 are intact.     Sensory: No sensory deficit.     Motor: Motor function is intact. No weakness, tremor or atrophy.     Deep Tendon Reflexes: Reflexes are normal and symmetric.     Wt Readings from Last 3 Encounters:  01/28/23 106 lb (48.1 kg)  07/28/22 105 lb (47.6 kg)  02/19/22 105 lb (47.6 kg)    BP 128/84   Pulse 71   Ht 5\' 3"  (1.6 m)   Wt 106 lb (48.1 kg)   SpO2 99%   BMI 18.78 kg/m   Assessment and Plan:  1. Benign paroxysmal positional vertigo, unspecified laterality New onset.  Patient has been having increasing vertigo noted when walking that she is off to her right.  We will set up an appointment with ENT that she is seeing Dr. Jenne Campus in the past for this and patient has been given low-dose Antivert to control some symptomatology.  I suspect there is probably a component of labyrinthitis and it this may be allergy related. - Ambulatory referral to ENT - meclizine (ANTIVERT) 12.5 MG tablet; Take 1 tablet (12.5 mg total) by mouth 3 (three) times daily as needed for dizziness.  Dispense: 30 tablet; Refill: 0  2. Mixed hyperlipidemia New onset.  Persistent.  Stable.  Continue rosuvastatin 20 mg once a day.   - rosuvastatin (CRESTOR) 20 MG tablet; Take 1 tablet (20 mg total) by mouth daily.  Dispense: 90 tablet; Refill: 0  3. Tension headache New onset.  Episodic.  Almost daily now.  Patient has bitemporal and neck pain consistent with tension headache of her muscle cocontraction nature.  I have suggested she may want to cut back on NSAIDs and may be perpetuating these headaches as rebound.  Neurologic exam was unremarkable with no focal signs noted.  Patient will return and we will recheck aspects and if pain  continues you may reenlist neurologic workup that she has had in the past  4. Fatigue, unspecified type New onset.  Persistent.  Patient has overwhelming fatigue which she has had on many occasions.  We will recheck CBC, thyroid panel, sed rate and comprehensive metabolic panel and will recheck patient in as needed basis if symptomatology continues. - CBC with Differential/Platelet - Thyroid Panel With TSH - Sedimentation rate - Comprehensive metabolic panel    Elizabeth Sauer, MD

## 2023-01-29 ENCOUNTER — Encounter: Payer: Self-pay | Admitting: Family Medicine

## 2023-01-29 LAB — COMPREHENSIVE METABOLIC PANEL
ALT: 29 [IU]/L (ref 0–32)
AST: 25 [IU]/L (ref 0–40)
Albumin: 4.6 g/dL (ref 3.9–4.9)
Alkaline Phosphatase: 47 [IU]/L (ref 44–121)
BUN/Creatinine Ratio: 29 — ABNORMAL HIGH (ref 12–28)
BUN: 23 mg/dL (ref 8–27)
Bilirubin Total: 0.4 mg/dL (ref 0.0–1.2)
CO2: 25 mmol/L (ref 20–29)
Calcium: 9.6 mg/dL (ref 8.7–10.3)
Chloride: 105 mmol/L (ref 96–106)
Creatinine, Ser: 0.8 mg/dL (ref 0.57–1.00)
Globulin, Total: 1.5 g/dL (ref 1.5–4.5)
Glucose: 83 mg/dL (ref 70–99)
Potassium: 4.6 mmol/L (ref 3.5–5.2)
Sodium: 144 mmol/L (ref 134–144)
Total Protein: 6.1 g/dL (ref 6.0–8.5)
eGFR: 80 mL/min/{1.73_m2} (ref >59)

## 2023-01-29 LAB — CBC WITH DIFFERENTIAL/PLATELET
Basophils Absolute: 0 10*3/uL (ref 0.0–0.2)
Basos: 0 %
EOS (ABSOLUTE): 0.1 10*3/uL (ref 0.0–0.4)
Eos: 1 %
Hematocrit: 37.3 % (ref 34.0–46.6)
Hemoglobin: 12.2 g/dL (ref 11.1–15.9)
Immature Grans (Abs): 0 10*3/uL (ref 0.0–0.1)
Immature Granulocytes: 0 %
Lymphocytes Absolute: 1.7 10*3/uL (ref 0.7–3.1)
Lymphs: 34 %
MCH: 32.1 pg (ref 26.6–33.0)
MCHC: 32.7 g/dL (ref 31.5–35.7)
MCV: 98 fL — ABNORMAL HIGH (ref 79–97)
Monocytes Absolute: 0.4 10*3/uL (ref 0.1–0.9)
Monocytes: 7 %
Neutrophils Absolute: 2.9 10*3/uL (ref 1.4–7.0)
Neutrophils: 58 %
Platelets: 159 10*3/uL (ref 150–450)
RBC: 3.8 x10E6/uL (ref 3.77–5.28)
RDW: 12.3 % (ref 11.7–15.4)
WBC: 5.1 10*3/uL (ref 3.4–10.8)

## 2023-01-29 LAB — THYROID PANEL WITH TSH
Free Thyroxine Index: 1.8 (ref 1.2–4.9)
T3 Uptake Ratio: 32 % (ref 24–39)
T4, Total: 5.6 ug/dL (ref 4.5–12.0)
TSH: 2.69 u[IU]/mL (ref 0.450–4.500)

## 2023-01-29 LAB — SEDIMENTATION RATE: Sed Rate: 2 mm/h (ref 0–40)

## 2023-01-31 ENCOUNTER — Other Ambulatory Visit: Payer: Self-pay | Admitting: Family Medicine

## 2023-01-31 DIAGNOSIS — G44209 Tension-type headache, unspecified, not intractable: Secondary | ICD-10-CM

## 2023-02-02 DIAGNOSIS — H903 Sensorineural hearing loss, bilateral: Secondary | ICD-10-CM | POA: Diagnosis not present

## 2023-02-02 DIAGNOSIS — R42 Dizziness and giddiness: Secondary | ICD-10-CM | POA: Diagnosis not present

## 2023-02-03 ENCOUNTER — Emergency Department: Payer: Medicare Other

## 2023-02-03 ENCOUNTER — Emergency Department
Admission: EM | Admit: 2023-02-03 | Discharge: 2023-02-03 | Disposition: A | Discharge status: Home / Self Care | Payer: Medicare Other | Attending: Emergency Medicine | Admitting: Emergency Medicine

## 2023-02-03 ENCOUNTER — Other Ambulatory Visit: Payer: Self-pay

## 2023-02-03 DIAGNOSIS — R519 Headache, unspecified: Secondary | ICD-10-CM | POA: Diagnosis not present

## 2023-02-03 DIAGNOSIS — R110 Nausea: Secondary | ICD-10-CM | POA: Diagnosis not present

## 2023-02-03 DIAGNOSIS — R42 Dizziness and giddiness: Secondary | ICD-10-CM | POA: Diagnosis not present

## 2023-02-03 DIAGNOSIS — M542 Cervicalgia: Secondary | ICD-10-CM | POA: Diagnosis not present

## 2023-02-03 DIAGNOSIS — I639 Cerebral infarction, unspecified: Secondary | ICD-10-CM | POA: Diagnosis not present

## 2023-02-03 LAB — URINALYSIS, ROUTINE W REFLEX MICROSCOPIC
Bilirubin Urine: NEGATIVE
Glucose, UA: NEGATIVE mg/dL
Hgb urine dipstick: NEGATIVE
Ketones, ur: NEGATIVE mg/dL
Leukocytes,Ua: NEGATIVE
Nitrite: NEGATIVE
Protein, ur: NEGATIVE mg/dL
Specific Gravity, Urine: 1.012 (ref 1.005–1.030)
pH: 6 (ref 5.0–8.0)

## 2023-02-03 LAB — CBC
HCT: 39.6 % (ref 36.0–46.0)
Hemoglobin: 13.4 g/dL (ref 12.0–15.0)
MCH: 31.9 pg (ref 26.0–34.0)
MCHC: 33.8 g/dL (ref 30.0–36.0)
MCV: 94.3 fL (ref 80.0–100.0)
Platelets: 145 10*3/uL — ABNORMAL LOW (ref 150–400)
RBC: 4.2 MIL/uL (ref 3.87–5.11)
RDW: 11.9 % (ref 11.5–15.5)
WBC: 5.1 10*3/uL (ref 4.0–10.5)
nRBC: 0 % (ref 0.0–0.2)

## 2023-02-03 LAB — BASIC METABOLIC PANEL
Anion gap: 7 (ref 5–15)
BUN: 21 mg/dL (ref 8–23)
CO2: 28 mmol/L (ref 22–32)
Calcium: 9.4 mg/dL (ref 8.9–10.3)
Chloride: 103 mmol/L (ref 98–111)
Creatinine, Ser: 0.62 mg/dL (ref 0.44–1.00)
GFR, Estimated: >60 mL/min (ref >60)
Glucose, Bld: 99 mg/dL (ref 70–99)
Potassium: 4.5 mmol/L (ref 3.5–5.1)
Sodium: 138 mmol/L (ref 135–145)

## 2023-02-03 MED ORDER — SODIUM CHLORIDE 0.9 % IV BOLUS
500.0000 mL | Freq: Once | INTRAVENOUS | Status: AC
Start: 2023-02-03 — End: 2023-02-03
  Administered 2023-02-03: 500 mL via INTRAVENOUS

## 2023-02-03 MED ORDER — MAGNESIUM SULFATE 2 GM/50ML IV SOLN
2.0000 g | Freq: Once | INTRAVENOUS | Status: AC
  Administered 2023-02-03: 2 g via INTRAVENOUS
  Filled 2023-02-03: qty 50

## 2023-02-03 MED ORDER — KETOROLAC TROMETHAMINE 15 MG/ML IJ SOLN
15.0000 mg | Freq: Once | INTRAMUSCULAR | Status: AC
  Administered 2023-02-03: 15 mg via INTRAVENOUS
  Filled 2023-02-03: qty 1

## 2023-02-03 MED ORDER — IOHEXOL 350 MG/ML SOLN
100.0000 mL | Freq: Once | INTRAVENOUS | Status: AC | PRN
Start: 2023-02-03 — End: 2023-02-03
  Administered 2023-02-03: 75 mL via INTRAVENOUS

## 2023-02-03 MED ORDER — DIPHENHYDRAMINE HCL 50 MG/ML IJ SOLN
12.5000 mg | Freq: Once | INTRAMUSCULAR | Status: AC
  Administered 2023-02-03: 12.5 mg via INTRAVENOUS
  Filled 2023-02-03: qty 1

## 2023-02-03 MED ORDER — PROCHLORPERAZINE EDISYLATE 10 MG/2ML IJ SOLN
10.0000 mg | Freq: Once | INTRAMUSCULAR | Status: AC
  Administered 2023-02-03: 10 mg via INTRAVENOUS
  Filled 2023-02-03: qty 2

## 2023-02-03 MED ORDER — ACETAMINOPHEN 500 MG PO TABS
1000.0000 mg | ORAL_TABLET | Freq: Once | ORAL | Status: AC
  Administered 2023-02-03: 1000 mg via ORAL
  Filled 2023-02-03: qty 2

## 2023-02-03 NOTE — Discharge Instructions (Addendum)
Fortunately your evaluation in the emergency department did not show any signs of emergency conditions that account for your headache.  Your CT scans and your angiogram to look to at the blood vessels in and around the brain and neck were normal.  Take acetaminophen 650 mg and ibuprofen 400 mg every 6 hours for pain.  Take with food.  Please call Dr. Malvin Johns who is a headache specialist for an appointment  Thank you for choosing [place] for your health care today!  Please see your primary doctor this week for a follow up appointment.   If you have any new, worsening, or unexpected symptoms call your doctor right away or come back to the emergency department for reevaluation.  It was my pleasure to care for you today.   Daneil Dan Modesto Charon, MD

## 2023-02-03 NOTE — ED Provider Notes (Signed)
[facility] Provider Note    Event Date/Time   First MD Initiated Contact with Patient 02/03/23 1528     (approximate)   History   Headache   HPI  Amanda Osborn is a 67 y.o. female   Past medical history of Stroke in 2019 with occasional headaches/dizziness ever since her stroke, who presents to the emergency department with headache/dizziness consistent with prior no more severe, now ongoing for 5 weeks.  No trauma, no recent illnesses.  It is associate with nausea.  She has no other acute medical complaints.    This pain starts at the base of neck to the occiput/ whole head.  No visual changes.  No acute neurologic complaints like motor or sensory deficits.  Independent Historian contributed to assessment above: Husband is at bedside to corroborate information past medical history as above     Physical Exam   Triage Vital Signs: ED Triage Vitals  Encounter Vitals Group     BP 02/03/23 1249 132/85     Systolic BP Percentile --      Diastolic BP Percentile --      Pulse Rate 02/03/23 1246 (!) 59     Resp 02/03/23 1246 18     Temp 02/03/23 1246 97.8 F (36.6 C)     Temp Source 02/03/23 1246 Oral     SpO2 02/03/23 1246 97 %     Weight 02/03/23 1247 102 lb (46.3 kg)     Height 02/03/23 1247 5\' 3"  (1.6 m)     Head Circumference --      Peak Flow --      Pain Score 02/03/23 1246 4     Pain Loc --      Pain Education --      Exclude from Growth Chart --     Most recent vital signs: Vitals:   02/03/23 1246 02/03/23 1249  BP:  132/85  Pulse: (!) 59   Resp: 18   Temp: 97.8 F (36.6 C)   SpO2: 97%     General: Awake, no distress.  CV:  Good peripheral perfusion.  Resp:  Normal effort.  Abd:  No distention.  Other:  Neck supple with full range of motion.  No neurologic deficits including dysarthria facial asymmetry motor or sensory deficits and her gait is normal.  Finger-to-nose is normal as well.   ED Results / Procedures /  Treatments   Labs (all labs ordered are listed, but only abnormal results are displayed) Labs Reviewed  CBC - Abnormal; Notable for the following components:      Result Value   Platelets 145 (*)    All other components within normal limits  URINALYSIS, ROUTINE W REFLEX MICROSCOPIC - Abnormal; Notable for the following components:   Color, Urine YELLOW (*)    APPearance CLEAR (*)    All other components within normal limits  BASIC METABOLIC PANEL  CBG MONITORING, ED     I ordered and reviewed the above labs they are notable for cell counts and electrolytes largely unremarkable.    RADIOLOGY I independently reviewed and interpreted CT scan of the head see no obvious bleeding or midline shift I also reviewed radiologist's formal read.   PROCEDURES:  Critical Care performed: No  Procedures   MEDICATIONS ORDERED IN ED: Medications  prochlorperazine (COMPAZINE) injection 10 mg (10 mg Intravenous Given 02/03/23 1722)  diphenhydrAMINE (BENADRYL) injection 12.5 mg (12.5 mg Intravenous Given 02/03/23 1725)  acetaminophen (TYLENOL) tablet 1,000 mg (1,000  mg Oral Given 02/03/23 1720)  ketorolac (TORADOL) 15 MG/ML injection 15 mg (15 mg Intravenous Given 02/03/23 1724)  magnesium sulfate IVPB 2 g 50 mL (0 g Intravenous Stopped 02/03/23 1917)  sodium chloride 0.9 % bolus 500 mL (0 mLs Intravenous Stopped 02/03/23 1815)  iohexol (OMNIPAQUE) 350 MG/ML injection 100 mL (75 mLs Intravenous Contrast Given 02/03/23 1731)    IMPRESSION / MDM / ASSESSMENT AND PLAN / ED COURSE  I reviewed the triage vital signs and the nursing notes.                                Patient's presentation is most consistent with acute presentation with potential threat to life or bodily function.  Differential diagnosis includes, but is not limited to, tension type headache, ICH, vertebral dissection, considered but less likely stroke   The patient is on the cardiac monitor to evaluate for evidence of  arrhythmia and/or significant heart rate changes.  MDM:    Primarily presenting with headache, mild dizziness, which has been episodic ever since her stroke 5 years ago.  This time more severe and long-lasting.  No focal neurologic deficits to suggest stroke.  CT scan negative for bleeding.  Consider vertebral dissection, got angiogram of the head and neck which is fortunately negative.  Symptoms improving with migraine cocktail.  Doubt neurologic emergency at this time given above workup, plan will be for discharge with PMD follow-up neurology follow-up        FINAL CLINICAL IMPRESSION(S) / ED DIAGNOSES   Final diagnoses:  Acute nonintractable headache, unspecified headache type     Rx / DC Orders   ED Discharge Orders     None        Note:  This document was prepared using Dragon voice recognition software and may include unintentional dictation errors.    Pilar Jarvis, MD 02/03/23 [id number]

## 2023-02-03 NOTE — ED Triage Notes (Signed)
Pt presents to ED with c/o of headache and emesis. Pt states stroke in the past. Pt states headache for 5 weeks. NAD noted.

## 2023-02-07 ENCOUNTER — Ambulatory Visit (INDEPENDENT_AMBULATORY_CARE_PROVIDER_SITE_OTHER): Payer: Medicare Other | Admitting: Family Medicine

## 2023-02-07 ENCOUNTER — Ambulatory Visit
Admission: RE | Admit: 2023-02-07 | Discharge: 2023-02-07 | Disposition: A | Discharge status: Home / Self Care | Payer: Medicare Other | Attending: Family Medicine | Admitting: Family Medicine

## 2023-02-07 ENCOUNTER — Encounter: Payer: Self-pay | Admitting: Family Medicine

## 2023-02-07 ENCOUNTER — Ambulatory Visit
Admission: RE | Admit: 2023-02-07 | Discharge: 2023-02-07 | Disposition: A | Discharge status: Home / Self Care | Payer: Medicare Other | Source: Ambulatory Visit | Attending: Family Medicine | Admitting: Family Medicine

## 2023-02-07 VITALS — BP 112/82 | HR 69 | Ht 63.0 in | Wt 106.0 lb

## 2023-02-07 DIAGNOSIS — G44229 Chronic tension-type headache, not intractable: Secondary | ICD-10-CM

## 2023-02-07 DIAGNOSIS — M4313 Spondylolisthesis, cervicothoracic region: Secondary | ICD-10-CM | POA: Diagnosis not present

## 2023-02-07 DIAGNOSIS — J301 Allergic rhinitis due to pollen: Secondary | ICD-10-CM | POA: Insufficient documentation

## 2023-02-07 DIAGNOSIS — M47812 Spondylosis without myelopathy or radiculopathy, cervical region: Secondary | ICD-10-CM | POA: Diagnosis not present

## 2023-02-07 DIAGNOSIS — M5030 Other cervical disc degeneration, unspecified cervical region: Secondary | ICD-10-CM | POA: Diagnosis not present

## 2023-02-07 DIAGNOSIS — M4802 Spinal stenosis, cervical region: Secondary | ICD-10-CM | POA: Diagnosis not present

## 2023-02-07 NOTE — Progress Notes (Signed)
Date:  02/07/2023   Name:  Amanda Osborn   DOB:  Sep 26, 1955   MRN:  [id number]   Chief Complaint: Headache (Better but still has a dull headache all the time, dizzy )  Headache  This is a chronic problem. The current episode started more than 1 month ago. The problem occurs intermittently. The problem has been waxing and waning. Pain location: generalized. The quality of the pain is described as dull and aching. The pain is mild. Associated symptoms include dizziness, drainage, eye watering, a loss of balance, nausea and rhinorrhea. Pertinent negatives include no abnormal behavior, back pain, blurred vision, ear pain, eye pain, eye redness, fever, muscle aches, neck pain, numbness, scalp tenderness, sinus pressure, sore throat, vomiting or weakness.    Lab Results  Component Value Date   NA 138 02/03/2023   K 4.5 02/03/2023   CO2 28 02/03/2023   GLUCOSE 99 02/03/2023   BUN 21 02/03/2023   CREATININE 0.62 02/03/2023   CALCIUM 9.4 02/03/2023   EGFR 80 01/28/2023   GFRNONAA >60 02/03/2023   Lab Results  Component Value Date   CHOL 148 07/28/2022   HDL 73 07/28/2022   LDLCALC 63 07/28/2022   TRIG 58 07/28/2022   CHOLHDL 2.1 05/17/2018   Lab Results  Component Value Date   TSH 2.690 01/28/2023   Lab Results  Component Value Date   HGBA1C 5.3 12/29/2017   Lab Results  Component Value Date   WBC 5.1 02/03/2023   HGB 13.4 02/03/2023   HCT 39.6 02/03/2023   MCV 94.3 02/03/2023   PLT 145 (L) 02/03/2023   Lab Results  Component Value Date   ALT 29 01/28/2023   AST 25 01/28/2023   ALKPHOS 47 01/28/2023   BILITOT 0.4 01/28/2023   No results found for: "25OHVITD2", "25OHVITD3", "VD25OH"   Review of Systems  Constitutional:  Negative for fever.  HENT:  Positive for rhinorrhea. Negative for ear pain, sinus pressure and sore throat.   Eyes:  Negative for blurred vision, pain and redness.  Cardiovascular:  Negative for chest pain, palpitations and leg swelling.   Gastrointestinal:  Positive for nausea. Negative for vomiting.  Musculoskeletal:  Negative for back pain and neck pain.  Neurological:  Positive for dizziness, headaches and loss of balance. Negative for weakness and numbness.    Patient Active Problem List   Diagnosis Date Noted   Pelvic pain in female 08/30/2019   Dizziness 03/21/2019   CVA (cerebral vascular accident) (HCC) 12/29/2017   Slurred speech 12/28/2017   Malignant neoplastic disease (HCC) 03/18/2015   Chondromalacia of patella 09/13/2014   Bursitis 09/13/2014   Allergic rhinitis 09/13/2014   Cancer (HCC) 09/13/2014   Cervical disc disease 09/13/2014    Allergies  Allergen Reactions   Montelukast Sodium     Caused headaches    Past Surgical History:  Procedure Laterality Date   AUGMENTATION MAMMAPLASTY Bilateral 2011   BREAST CYST ASPIRATION Bilateral    BREAST EXCISIONAL BIOPSY Left 2006   breast ca   BREAST LUMPECTOMY     CARDIAC CATHETERIZATION     COLONOSCOPY     COLONOSCOPY WITH PROPOFOL N/A 06/28/2016   Procedure: COLONOSCOPY WITH PROPOFOL;  Surgeon: Christena Deem, MD;  Location: [facility] ENDOSCOPY;  Service: Endoscopy;  Laterality: N/A;   CYSTOSCOPY W/ URETERAL STENT PLACEMENT Left 09/21/2019   Procedure: CYSTOSCOPY WITH RETROGRADE PYELOGRAM/URETERAL STENT PLACEMENT;  Surgeon: Sondra Come, MD;  Location: [facility] ORS;  Service: Urology;  Laterality: Left;  LYMPH NODE BIOPSY     NECK   MASTECTOMY Bilateral    TEE WITHOUT CARDIOVERSION N/A 12/30/2017   Procedure: TRANSESOPHAGEAL ECHOCARDIOGRAM (TEE);  Surgeon: Dalia Heading, MD;  Location: [facility] ORS;  Service: Cardiovascular;  Laterality: N/A;   TOTAL VAGINAL HYSTERECTOMY     URETEROSCOPY Left 09/21/2019   Procedure: URETEROSCOPY-DIAGNOSTIC;  Surgeon: Sondra Come, MD;  Location: [facility] ORS;  Service: Urology;  Laterality: Left;   VAGINAL HYSTERECTOMY      Social History   Tobacco Use   Smoking status: Never   Smokeless tobacco: Never   Vaping Use   Vaping status: Never Used  Substance Use Topics   Alcohol use: No    Alcohol/week: 0.0 standard drinks of alcohol   Drug use: No     Medication list has been reviewed and updated.  Current Meds  Medication Sig   acyclovir (ZOVIRAX) 400 MG tablet Take 400 mg by mouth 2 (two) times daily as needed (fever blister).    aspirin EC 325 MG EC tablet Take 1 tablet (325 mg total) by mouth daily.   buPROPion (WELLBUTRIN SR) 100 MG 12 hr tablet Take 100 mg by mouth every morning.    Cholecalciferol (VITAMIN D-3) 125 MCG (5000 UT) TABS Take 5,000 Units by mouth daily.   meclizine (ANTIVERT) 12.5 MG tablet Take 1 tablet (12.5 mg total) by mouth 3 (three) times daily as needed for dizziness.   Multiple Vitamin (MULTIVITAMIN WITH MINERALS) TABS tablet Take 1 tablet by mouth daily. Alive Multivitamin   rosuvastatin (CRESTOR) 20 MG tablet Take 1 tablet (20 mg total) by mouth daily.   sertraline (ZOLOFT) 50 MG tablet Take 1 tablet (50 mg total) by mouth daily.   traZODone (DESYREL) 50 MG tablet Take 1.5 tablets (75 mg total) by mouth at bedtime.   triamcinolone (NASACORT) 55 MCG/ACT AERO nasal inhaler Place 2 sprays into the nose daily.       02/07/2023    1:30 PM 07/28/2022   10:08 AM 02/19/2022    8:26 AM 02/09/2022    2:35 PM  GAD 7 : Generalized Anxiety Score  Nervous, Anxious, on Edge 0 0 0 0  Control/stop worrying 0 0 0 0  Worry too much - different things 0 0 0 0  Trouble relaxing 0 0 0 0  Restless 0 0 0 0  Easily annoyed or irritable 0 0 0 0  Afraid - awful might happen 0 0 0 0  Total GAD 7 Score 0 0 0 0  Anxiety Difficulty Not difficult at all Not difficult at all Not difficult at all Not difficult at all       02/07/2023    1:30 PM 01/19/2023   10:07 AM 07/28/2022   10:08 AM  Depression screen PHQ 2/9  Decreased Interest 0 0 0  Down, Depressed, Hopeless 1 1 0  PHQ - 2 Score 1 1 0  Altered sleeping 0 0 0  Tired, decreased energy 1 1 0  Change in appetite 0 0 0   Feeling bad or failure about yourself  0 0 0  Trouble concentrating 0 0 0  Moving slowly or fidgety/restless 0 0 0  Suicidal thoughts 0 0 0  PHQ-9 Score 2 2 0  Difficult doing work/chores Not difficult at all Not difficult at all Not difficult at all    BP Readings from Last 3 Encounters:  02/07/23 112/82  02/03/23 132/85  01/28/23 128/84    Physical Exam Vitals and nursing note reviewed. Exam  conducted with a chaperone present.  Constitutional:      General: She is not in acute distress.    Appearance: She is not diaphoretic.  HENT:     Head: Normocephalic and atraumatic.     Right Ear: External ear normal.     Left Ear: External ear normal.     Nose: Nose normal.  Eyes:     General:        Right eye: No discharge.        Left eye: No discharge.     Conjunctiva/sclera: Conjunctivae normal.     Pupils: Pupils are equal, round, and reactive to light.  Neck:     Thyroid: No thyromegaly.     Vascular: No JVD.  Cardiovascular:     Rate and Rhythm: Normal rate and regular rhythm.     Heart sounds: Normal heart sounds. No murmur heard.    No friction rub. No gallop.  Pulmonary:     Effort: Pulmonary effort is normal.     Breath sounds: Normal breath sounds. No rhonchi or rales.  Chest:     Chest wall: No tenderness.  Abdominal:     General: Bowel sounds are normal.     Palpations: Abdomen is soft. There is no mass.     Tenderness: There is no abdominal tenderness. There is no guarding.  Musculoskeletal:        General: No swelling or tenderness. Normal range of motion.     Cervical back: Normal range of motion and neck supple.  Lymphadenopathy:     Cervical: No cervical adenopathy.  Skin:    General: Skin is warm and dry.  Neurological:     Mental Status: She is alert.     Deep Tendon Reflexes: Reflexes are normal and symmetric.     Wt Readings from Last 3 Encounters:  02/07/23 106 lb (48.1 kg)  02/03/23 102 lb (46.3 kg)  01/28/23 106 lb (48.1 kg)    BP  112/82   Pulse 69   Ht 5\' 3"  (1.6 m)   Wt 106 lb (48.1 kg)   SpO2 98%   BMI 18.78 kg/m   Assessment and Plan: 1. Chronic tension-type headache, not intractable Chronic.  Persistent.  Waxes and wanes in intensity.  It is a lower part of the neck and extends generalized over the vertex of the head.  This is consistent with a headache well possible muscle contraction headache.  There is some paraspinal spasm in the cervical area with no spinous process tenderness.  We will obtain an x-ray of the cervical spine and if significant arthritis is noted consider referral to orthopedics. - DG Cervical Spine Complete; normal  2. Seasonal allergic rhinitis due to pollen Chronic.  Controlled.  Stable.  The triad of low-dose Sudafed, nasal steroid Nasacort which the patient has, and mucolytic agent such as Mucinex has been suggested until we have a first frost and may be decreased some of the antigen load. - DG Cervical Spine Complete;     Elizabeth Sauer, MD

## 2023-02-10 DIAGNOSIS — R42 Dizziness and giddiness: Secondary | ICD-10-CM | POA: Diagnosis not present

## 2023-02-15 ENCOUNTER — Other Ambulatory Visit: Payer: Self-pay | Admitting: Unknown Physician Specialty

## 2023-02-15 DIAGNOSIS — R42 Dizziness and giddiness: Secondary | ICD-10-CM

## 2023-02-15 DIAGNOSIS — H903 Sensorineural hearing loss, bilateral: Secondary | ICD-10-CM

## 2023-02-21 ENCOUNTER — Other Ambulatory Visit
Admission: RE | Admit: 2023-02-21 | Discharge: 2023-02-21 | Disposition: A | Discharge status: Home / Self Care | Payer: Medicare Other | Source: Ambulatory Visit | Attending: Ophthalmology | Admitting: Ophthalmology

## 2023-02-21 DIAGNOSIS — M316 Other giant cell arteritis: Secondary | ICD-10-CM | POA: Insufficient documentation

## 2023-02-21 DIAGNOSIS — G4452 New daily persistent headache (NDPH): Secondary | ICD-10-CM | POA: Diagnosis not present

## 2023-02-21 LAB — C-REACTIVE PROTEIN: CRP: 0.8 mg/dL (ref <1.0)

## 2023-02-21 LAB — SEDIMENTATION RATE: Sed Rate: 2 mm/h (ref 0–30)

## 2023-02-28 DIAGNOSIS — R55 Syncope and collapse: Secondary | ICD-10-CM | POA: Diagnosis not present

## 2023-02-28 DIAGNOSIS — I2089 Other forms of angina pectoris: Secondary | ICD-10-CM | POA: Diagnosis not present

## 2023-02-28 DIAGNOSIS — G4733 Obstructive sleep apnea (adult) (pediatric): Secondary | ICD-10-CM | POA: Diagnosis not present

## 2023-02-28 DIAGNOSIS — E782 Mixed hyperlipidemia: Secondary | ICD-10-CM | POA: Diagnosis not present

## 2023-02-28 DIAGNOSIS — R42 Dizziness and giddiness: Secondary | ICD-10-CM | POA: Diagnosis not present

## 2023-02-28 DIAGNOSIS — I639 Cerebral infarction, unspecified: Secondary | ICD-10-CM | POA: Diagnosis not present

## 2023-02-28 DIAGNOSIS — R519 Headache, unspecified: Secondary | ICD-10-CM | POA: Diagnosis not present

## 2023-03-01 ENCOUNTER — Emergency Department: Payer: Medicare Other

## 2023-03-01 ENCOUNTER — Emergency Department
Admission: EM | Admit: 2023-03-01 | Discharge: 2023-03-01 | Disposition: A | Discharge status: Home / Self Care | Payer: Medicare Other | Attending: Emergency Medicine | Admitting: Emergency Medicine

## 2023-03-01 ENCOUNTER — Other Ambulatory Visit: Payer: Self-pay

## 2023-03-01 ENCOUNTER — Encounter: Payer: Self-pay | Admitting: Emergency Medicine

## 2023-03-01 DIAGNOSIS — R110 Nausea: Secondary | ICD-10-CM | POA: Diagnosis not present

## 2023-03-01 DIAGNOSIS — R519 Headache, unspecified: Secondary | ICD-10-CM | POA: Insufficient documentation

## 2023-03-01 DIAGNOSIS — I6782 Cerebral ischemia: Secondary | ICD-10-CM | POA: Diagnosis not present

## 2023-03-01 DIAGNOSIS — R42 Dizziness and giddiness: Secondary | ICD-10-CM | POA: Diagnosis not present

## 2023-03-01 DIAGNOSIS — R1111 Vomiting without nausea: Secondary | ICD-10-CM | POA: Diagnosis not present

## 2023-03-01 DIAGNOSIS — H9190 Unspecified hearing loss, unspecified ear: Secondary | ICD-10-CM | POA: Diagnosis not present

## 2023-03-01 LAB — CBC WITH DIFFERENTIAL/PLATELET
Abs Immature Granulocytes: 0.01 10*3/uL (ref 0.00–0.07)
Basophils Absolute: 0 10*3/uL (ref 0.0–0.1)
Basophils Relative: 1 %
Eosinophils Absolute: 0 10*3/uL (ref 0.0–0.5)
Eosinophils Relative: 1 %
HCT: 35.7 % — ABNORMAL LOW (ref 36.0–46.0)
Hemoglobin: 12.3 g/dL (ref 12.0–15.0)
Immature Granulocytes: 0 %
Lymphocytes Relative: 31 %
Lymphs Abs: 1.4 10*3/uL (ref 0.7–4.0)
MCH: 32.1 pg (ref 26.0–34.0)
MCHC: 34.5 g/dL (ref 30.0–36.0)
MCV: 93.2 fL (ref 80.0–100.0)
Monocytes Absolute: 0.3 10*3/uL (ref 0.1–1.0)
Monocytes Relative: 8 %
Neutro Abs: 2.6 10*3/uL (ref 1.7–7.7)
Neutrophils Relative %: 59 %
Platelets: 135 10*3/uL — ABNORMAL LOW (ref 150–400)
RBC: 3.83 MIL/uL — ABNORMAL LOW (ref 3.87–5.11)
RDW: 12 % (ref 11.5–15.5)
WBC: 4.4 10*3/uL (ref 4.0–10.5)
nRBC: 0 % (ref 0.0–0.2)

## 2023-03-01 LAB — BASIC METABOLIC PANEL
Anion gap: 9 (ref 5–15)
BUN: 33 mg/dL — ABNORMAL HIGH (ref 8–23)
CO2: 27 mmol/L (ref 22–32)
Calcium: 9.1 mg/dL (ref 8.9–10.3)
Chloride: 103 mmol/L (ref 98–111)
Creatinine, Ser: 0.69 mg/dL (ref 0.44–1.00)
GFR, Estimated: >60 mL/min (ref >60)
Glucose, Bld: 128 mg/dL — ABNORMAL HIGH (ref 70–99)
Potassium: 3.5 mmol/L (ref 3.5–5.1)
Sodium: 139 mmol/L (ref 135–145)

## 2023-03-01 MED ORDER — MECLIZINE HCL 25 MG PO TABS
25.0000 mg | ORAL_TABLET | Freq: Once | ORAL | Status: AC
  Administered 2023-03-01: 25 mg via ORAL
  Filled 2023-03-01: qty 1

## 2023-03-01 MED ORDER — MECLIZINE HCL 25 MG PO TABS: 25.0000 mg | ORAL_TABLET | Freq: Three times a day (TID) | ORAL | 0 refills | Status: AC | PRN

## 2023-03-01 MED ORDER — LORAZEPAM 2 MG/ML IJ SOLN
1.0000 mg | Freq: Once | INTRAMUSCULAR | Status: AC
  Administered 2023-03-01: 1 mg via INTRAVENOUS
  Filled 2023-03-01: qty 1

## 2023-03-01 MED ORDER — PROCHLORPERAZINE EDISYLATE 10 MG/2ML IJ SOLN
10.0000 mg | Freq: Once | INTRAMUSCULAR | Status: AC
  Administered 2023-03-01: 10 mg via INTRAVENOUS
  Filled 2023-03-01: qty 2

## 2023-03-01 MED ORDER — DIPHENHYDRAMINE HCL 50 MG/ML IJ SOLN
25.0000 mg | Freq: Once | INTRAMUSCULAR | Status: AC
  Administered 2023-03-01: 25 mg via INTRAVENOUS
  Filled 2023-03-01: qty 1

## 2023-03-01 MED ORDER — GADOBUTROL 1 MMOL/ML IV SOLN
5.0000 mL | Freq: Once | INTRAVENOUS | Status: AC | PRN
Start: 2023-03-01 — End: 2023-03-01
  Administered 2023-03-01: 5 mL via INTRAVENOUS

## 2023-03-01 MED ORDER — KETOROLAC TROMETHAMINE 30 MG/ML IJ SOLN
15.0000 mg | Freq: Once | INTRAMUSCULAR | Status: AC
  Administered 2023-03-01: 15 mg via INTRAVENOUS
  Filled 2023-03-01: qty 1

## 2023-03-01 NOTE — ED Provider Notes (Signed)
[facility] Provider Note    Event Date/Time   First MD Initiated Contact with Patient 03/01/23 0423     (approximate)   History   Dizziness   HPI  Amanda Osborn is a 67 y.o. female who presents to the ED for evaluation of Dizziness   Review ED visit from 10/24 and PCP follow-up on 10/28, cardiology visit from yesterday.  CTA neck/head on her ED visit was normal  Patient presents to the ED for evaluation of "another episode of my vertigo."  She reports about 3 hours of photophobia, headache and spinning.  No falls, syncope or trauma.  No weakness to the extremities.  Reports this is of a similar quality as previous episodes but of a fairly severe intensity.    Physical Exam   Triage Vital Signs: ED Triage Vitals  Encounter Vitals Group     BP      Systolic BP Percentile      Diastolic BP Percentile      Pulse      Resp      Temp      Temp src      SpO2      Weight      Height      Head Circumference      Peak Flow      Pain Score      Pain Loc      Pain Education      Exclude from Growth Chart     Most recent vital signs: Vitals:   03/01/23 0530 03/01/23 0600  BP: 127/79 116/76  Pulse: 62 60  Resp: 16 15  Temp:    SpO2: 98% 97%    General: Awake, no distress.  Shielding her eyes and obviously photophobic. CV:  Good peripheral perfusion.  Resp:  Normal effort.  Abd:  No distention.  MSK:  No deformity noted.  Neuro:  No focal deficits appreciated. Cranial nerves II through XII intact 5/5 strength and sensation in all 4 extremities Other:     ED Results / Procedures / Treatments   Labs (all labs ordered are listed, but only abnormal results are displayed) Labs Reviewed  CBC WITH DIFFERENTIAL/PLATELET - Abnormal; Notable for the following components:      Result Value   RBC 3.83 (*)    HCT 35.7 (*)    Platelets 135 (*)    All other components within normal limits  BASIC METABOLIC PANEL - Abnormal; Notable for the  following components:   Glucose, Bld 128 (*)    BUN 33 (*)    All other components within normal limits    EKG Sinus rhythm with a rate of 60 bpm.  Normal axis and intervals.  No clear signs of acute ischemia.  RADIOLOGY   Official radiology report(s): No results found.  PROCEDURES and INTERVENTIONS:  .1-3 Lead EKG Interpretation  Performed by: Delton Prairie, MD Authorized by: Delton Prairie, MD     Interpretation: normal     ECG rate:  62   ECG rate assessment: normal     Rhythm: sinus rhythm     Ectopy: none     Conduction: normal     Medications  gadobutrol (GADAVIST) 1 MMOL/ML injection 5 mL (has no administration in time range)  ketorolac (TORADOL) 30 MG/ML injection 15 mg (15 mg Intravenous Given 03/01/23 0451)  meclizine (ANTIVERT) tablet 25 mg (25 mg Oral Given 03/01/23 0456)  diphenhydrAMINE (BENADRYL) injection 25 mg (25 mg Intravenous  Given 03/01/23 0451)  prochlorperazine (COMPAZINE) injection 10 mg (10 mg Intravenous Given 03/01/23 0452)  LORazepam (ATIVAN) injection 1 mg (1 mg Intravenous Given 03/01/23 1610)     IMPRESSION / MDM / ASSESSMENT AND PLAN / ED COURSE  I reviewed the triage vital signs and the nursing notes.  Differential diagnosis includes, but is not limited to,   {Patient presents with symptoms of an acute illness or injury that is potentially life-threatening.  Patient presents with acute on chronic dizziness.  No clear neurologic deficits, no trauma.  Screening blood work is benign with a normal hemoglobin and electrolytes.  As below, some improvement with medications but does not resolve her dizziness so we will obtain MRI to rule out CNS pathology.  Clinical Course as of 03/01/23 0647  Tue Mar 01, 2023  0542 Reassessed, she looks much better, she is opening her eyes and talking to me.  She reports feeling a little bit better.  Husband is at the bedside.  We discussed plan of care.  We discussed pending MRI in the next couple weeks  scheduled for outpatient.  She is interested in an MRI tonight, but at this point considering, she better she looks and no clear concerns for stroke or other acute CNS derangements, I am not sure this would be necessary for an emergent standpoint.  I discussed this with her and my hesitations and that I suspect should be fine to go home, she is agreeable. [DS]  [phone] Reassessed, was unable to get up and ambulate with nursing staff.  We will go ahead and just get the MRI.  She will be signed out to oncoming physician to follow-up on the study [DS]    Clinical Course User Index [DS] Delton Prairie, MD     FINAL CLINICAL IMPRESSION(S) / ED DIAGNOSES   Final diagnoses:  Dizziness     Rx / DC Orders   ED Discharge Orders     None        Note:  This document was prepared using Dragon voice recognition software and may include unintentional dictation errors.   Delton Prairie, MD 03/01/23 [phone]

## 2023-03-01 NOTE — ED Notes (Signed)
This RN attempted to ambulate pt. Pt was stating "if I move I will get dizzy". Pt did not want to attempt walking at this time. This RN notified provider.

## 2023-03-01 NOTE — ED Triage Notes (Signed)
Pt arrives from home via EMS for an 'episode' of dizziness with nausea/vomiting. Recently presented to ER for the same with negative workup and recommend follow up MRI. Pt arrives with head down and eyes closed - stating "everything is spinning". Appears in distress. A/ox4, MAE.

## 2023-03-01 NOTE — ED Provider Notes (Signed)
MR Brain W and Wo Contrast  Result Date: 03/01/2023 CLINICAL DATA:  Vertigo and hearing loss. EXAM: MRI HEAD WITHOUT AND WITH CONTRAST TECHNIQUE: Multiplanar, multiecho pulse sequences of the brain and surrounding structures were obtained without and with intravenous contrast. CONTRAST:  5mL GADAVIST GADOBUTROL 1 MMOL/ML IV SOLN COMPARISON:  Head CT and CTA 02/03/2023 FINDINGS: Brain: No acute infarction, hemorrhage, hydrocephalus, extra-axial collection or mass lesion. Mild chronic small vessel ischemia in the cerebral white matter. Age normal brain volume. Small chronic left cerebellar infarct. No abnormal enhancement. No evidence of retrocochlear lesion. Vascular: Normal flow voids. Skull and upper cervical spine: Normal marrow signal. Sinuses/Orbits: Negative. IMPRESSION: 1. No recent insult or specific explanation for symptoms. 2. Small chronic left cerebellar infarct present since at least 2019. Mild chronic small vessel ischemia. Electronically Signed   By: Tiburcio Pea M.D.   On: 03/01/2023 07:29    Discussed reassuring MRI result with patient and her family.  They are very comfortable with this.  She is resting right now feels improved.  Appropriate for discharge  Advised to be cautious with being up and about, risk of becoming dizzy and falling, not driving her vehicle for at least the next few days until her symptoms are well under control.  Discussed follow-up with the ENT.  Return precautions and treatment recommendations and follow-up discussed with the patient who is agreeable with the plan.  Patient previously had meclizine advised that she is run out of it, agreeable to trying a slightly higher dose.   Sharyn Creamer, MD 03/01/23 [phone]

## 2023-03-07 DIAGNOSIS — F5105 Insomnia due to other mental disorder: Secondary | ICD-10-CM | POA: Diagnosis not present

## 2023-03-07 DIAGNOSIS — F411 Generalized anxiety disorder: Secondary | ICD-10-CM | POA: Diagnosis not present

## 2023-03-08 DIAGNOSIS — C801 Malignant (primary) neoplasm, unspecified: Secondary | ICD-10-CM | POA: Diagnosis not present

## 2023-03-08 DIAGNOSIS — Z7189 Other specified counseling: Secondary | ICD-10-CM | POA: Diagnosis not present

## 2023-03-17 ENCOUNTER — Other Ambulatory Visit: Payer: BLUE CROSS/BLUE SHIELD

## 2023-03-17 DIAGNOSIS — R42 Dizziness and giddiness: Secondary | ICD-10-CM | POA: Diagnosis not present

## 2023-03-31 ENCOUNTER — Ambulatory Visit: Payer: Medicare Other | Admitting: Family Medicine

## 2023-03-31 DIAGNOSIS — R55 Syncope and collapse: Secondary | ICD-10-CM | POA: Diagnosis not present

## 2023-03-31 DIAGNOSIS — R519 Headache, unspecified: Secondary | ICD-10-CM | POA: Diagnosis not present

## 2023-03-31 DIAGNOSIS — R42 Dizziness and giddiness: Secondary | ICD-10-CM | POA: Diagnosis not present

## 2023-03-31 DIAGNOSIS — R4189 Other symptoms and signs involving cognitive functions and awareness: Secondary | ICD-10-CM | POA: Diagnosis not present

## 2023-04-01 ENCOUNTER — Encounter: Payer: Self-pay | Admitting: Family Medicine

## 2023-04-01 ENCOUNTER — Ambulatory Visit (INDEPENDENT_AMBULATORY_CARE_PROVIDER_SITE_OTHER): Payer: Medicare Other | Admitting: Family Medicine

## 2023-04-01 DIAGNOSIS — F324 Major depressive disorder, single episode, in partial remission: Secondary | ICD-10-CM

## 2023-04-01 DIAGNOSIS — R0981 Nasal congestion: Secondary | ICD-10-CM

## 2023-04-01 DIAGNOSIS — F5101 Primary insomnia: Secondary | ICD-10-CM

## 2023-04-01 DIAGNOSIS — E782 Mixed hyperlipidemia: Secondary | ICD-10-CM | POA: Diagnosis not present

## 2023-04-01 MED ORDER — TRAZODONE HCL 50 MG PO TABS: 75.0000 mg | ORAL_TABLET | Freq: Every day | ORAL | 1 refills | Status: AC

## 2023-04-01 MED ORDER — SERTRALINE HCL 50 MG PO TABS: 75.0000 mg | ORAL_TABLET | Freq: Every day | ORAL | 1 refills | Status: AC

## 2023-04-01 MED ORDER — TRIAMCINOLONE ACETONIDE 55 MCG/ACT NA AERO: 2.0000 | INHALATION_SPRAY | Freq: Every day | NASAL | 12 refills | Status: AC

## 2023-04-01 MED ORDER — ROSUVASTATIN CALCIUM 20 MG PO TABS: 20.0000 mg | ORAL_TABLET | Freq: Every day | ORAL | 1 refills | Status: DC

## 2023-04-01 NOTE — Progress Notes (Signed)
Date:  04/01/2023   Name:  Amanda Osborn   DOB:  16-Jun-1955   MRN:  [id number]   Chief Complaint: Depression, Hyperlipidemia, and Insomnia  Depression        This is a chronic problem.  The current episode started more than 1 month ago.   The onset quality is gradual.   The problem occurs daily.  The problem has been gradually improving since onset.  Associated symptoms include decreased concentration, hopelessness, insomnia, decreased interest and sad.  Associated symptoms include no fatigue.     Exacerbated by: stress. Hyperlipidemia This is a recurrent problem. The current episode started more than 1 year ago. The problem is controlled. Recent lipid tests were reviewed and are normal. She has no history of chronic renal disease or diabetes. Pertinent negatives include no chest pain or shortness of breath. Current antihyperlipidemic treatment includes statins. The current treatment provides moderate improvement of lipids. Compliance problems include adherence to diet, medication cost and medication side effects.   Insomnia Primary symptoms: no fragmented sleep, no difficulty falling asleep, no premature morning awakening.   The current episode started more than one month. The onset quality is gradual. The problem occurs intermittently. PMH includes: depression.     Lab Results  Component Value Date   NA 139 03/01/2023   K 3.5 03/01/2023   CO2 27 03/01/2023   GLUCOSE 128 (H) 03/01/2023   BUN 33 (H) 03/01/2023   CREATININE 0.69 03/01/2023   CALCIUM 9.1 03/01/2023   EGFR 80 01/28/2023   GFRNONAA >60 03/01/2023   Lab Results  Component Value Date   CHOL 148 07/28/2022   HDL 73 07/28/2022   LDLCALC 63 07/28/2022   TRIG 58 07/28/2022   CHOLHDL 2.1 05/17/2018   Lab Results  Component Value Date   TSH 2.690 01/28/2023   Lab Results  Component Value Date   HGBA1C 5.3 12/29/2017   Lab Results  Component Value Date   WBC 4.4 03/01/2023   HGB 12.3 03/01/2023   HCT 35.7  (L) 03/01/2023   MCV 93.2 03/01/2023   PLT 135 (L) 03/01/2023   Lab Results  Component Value Date   ALT 29 01/28/2023   AST 25 01/28/2023   ALKPHOS 47 01/28/2023   BILITOT 0.4 01/28/2023   No results found for: "25OHVITD2", "25OHVITD3", "VD25OH"   Review of Systems  Constitutional:  Negative for chills, diaphoresis, fatigue and unexpected weight change.  HENT:  Negative for dental problem, mouth sores, postnasal drip, rhinorrhea, sinus pressure, sneezing and sore throat.   Respiratory:  Negative for cough, choking, chest tightness, shortness of breath, wheezing and stridor.   Cardiovascular:  Negative for chest pain, palpitations and leg swelling.  Psychiatric/Behavioral:  Positive for decreased concentration and depression. The patient has insomnia.     Patient Active Problem List   Diagnosis Date Noted   Pelvic pain in female 08/30/2019   Dizziness 03/21/2019   CVA (cerebral vascular accident) (HCC) 12/29/2017   Slurred speech 12/28/2017   Malignant neoplastic disease (HCC) 03/18/2015   Chondromalacia of patella 09/13/2014   Bursitis 09/13/2014   Allergic rhinitis 09/13/2014   Cancer (HCC) 09/13/2014   Cervical disc disease 09/13/2014    Allergies  Allergen Reactions   Montelukast Sodium     Caused headaches    Past Surgical History:  Procedure Laterality Date   AUGMENTATION MAMMAPLASTY Bilateral 2011   BREAST CYST ASPIRATION Bilateral    BREAST EXCISIONAL BIOPSY Left 2006   breast ca   BREAST  LUMPECTOMY     CARDIAC CATHETERIZATION     COLONOSCOPY     COLONOSCOPY WITH PROPOFOL N/A 06/28/2016   Procedure: COLONOSCOPY WITH PROPOFOL;  Surgeon: Christena Deem, MD;  Location: [facility] ENDOSCOPY;  Service: Endoscopy;  Laterality: N/A;   CYSTOSCOPY W/ URETERAL STENT PLACEMENT Left 09/21/2019   Procedure: CYSTOSCOPY WITH RETROGRADE PYELOGRAM/URETERAL STENT PLACEMENT;  Surgeon: Sondra Come, MD;  Location: [facility] ORS;  Service: Urology;  Laterality: Left;   LYMPH NODE  BIOPSY     NECK   MASTECTOMY Bilateral    TEE WITHOUT CARDIOVERSION N/A 12/30/2017   Procedure: TRANSESOPHAGEAL ECHOCARDIOGRAM (TEE);  Surgeon: Dalia Heading, MD;  Location: [facility] ORS;  Service: Cardiovascular;  Laterality: N/A;   TOTAL VAGINAL HYSTERECTOMY     URETEROSCOPY Left 09/21/2019   Procedure: URETEROSCOPY-DIAGNOSTIC;  Surgeon: Sondra Come, MD;  Location: [facility] ORS;  Service: Urology;  Laterality: Left;   VAGINAL HYSTERECTOMY      Social History   Tobacco Use   Smoking status: Never   Smokeless tobacco: Never  Vaping Use   Vaping status: Never Used  Substance Use Topics   Alcohol use: No    Alcohol/week: 0.0 standard drinks of alcohol   Drug use: No     Medication list has been reviewed and updated.  No outpatient medications have been marked as taking for the 04/01/23 encounter (Office Visit) with Duanne Limerick, MD.       04/01/2023    1:55 PM 02/07/2023    1:30 PM 07/28/2022   10:08 AM 02/19/2022    8:26 AM  GAD 7 : Generalized Anxiety Score  Nervous, Anxious, on Edge 2 0 0 0  Control/stop worrying 2 0 0 0  Worry too much - different things 2 0 0 0  Trouble relaxing 2 0 0 0  Restless 2 0 0 0  Easily annoyed or irritable 1 0 0 0  Afraid - awful might happen 2 0 0 0  Total GAD 7 Score 13 0 0 0  Anxiety Difficulty Somewhat difficult Not difficult at all Not difficult at all Not difficult at all       04/01/2023    1:54 PM 02/07/2023    1:30 PM 01/19/2023   10:07 AM  Depression screen PHQ 2/9  Decreased Interest 1 0 0  Down, Depressed, Hopeless 1 1 1   PHQ - 2 Score 2 1 1   Altered sleeping 1 0 0  Tired, decreased energy 2 1 1   Change in appetite 1 0 0  Feeling bad or failure about yourself  1 0 0  Trouble concentrating 2 0 0  Moving slowly or fidgety/restless 1 0 0  Suicidal thoughts 0 0 0  PHQ-9 Score 10 2 2   Difficult doing work/chores Somewhat difficult Not difficult at all Not difficult at all    BP Readings from Last 3 Encounters:   04/01/23 112/76  03/01/23 121/75  02/07/23 112/82    Physical Exam Vitals and nursing note reviewed.  HENT:     Right Ear: Tympanic membrane, ear canal and external ear normal. There is no impacted cerumen.     Left Ear: Tympanic membrane, ear canal and external ear normal. There is no impacted cerumen.     Nose: Nose normal. No congestion or rhinorrhea.     Mouth/Throat:     Mouth: Mucous membranes are moist.     Pharynx: No oropharyngeal exudate.  Eyes:     Pupils: Pupils are equal, round, and reactive to light.  Cardiovascular:     Rate and Rhythm: Normal rate and regular rhythm.     Heart sounds: No murmur heard.    No friction rub. No gallop.  Pulmonary:     Breath sounds: No wheezing, rhonchi or rales.  Abdominal:     Tenderness: There is no abdominal tenderness.     Hernia: No hernia is present.     Wt Readings from Last 3 Encounters:  03/01/23 103 lb (46.7 kg)  02/07/23 106 lb (48.1 kg)  02/03/23 102 lb (46.3 kg)    BP 112/76   Pulse 75   Ht 5\' 3"  (1.6 m)   SpO2 97%   BMI 18.25 kg/m   Assessment and Plan:  1. Major depressive disorder in partial remission, unspecified whether recurrent (HCC) Chronic.  Uncontrolled.  Yet stable.  Patient currently on 50 mg of sertraline we will increase to 75 mg 1-1/2 tablets once a day.  Patient will call Dr. Rhina Brackett office to verify that she is doing this to see if this is sufficient as well as Dr. Alver Fisher office since he is evaluating her for seizures. - sertraline (ZOLOFT) 50 MG tablet; Take 1.5 tablets (75 mg total) by mouth daily.  Dispense: 135 tablet; Refill: 1  2. Primary insomnia Chronic.  Controlled.  Stable.  Continue trazodone current dosing. - traZODone (DESYREL) 50 MG tablet; Take 1.5 tablets (75 mg total) by mouth at bedtime.  Dispense: 135 tablet; Refill: 1  3. Sinus congestion Chronic.  Episodic.  Likely seasonal.  Continue Nasacort on an as-needed basis. - triamcinolone (NASACORT) 55 MCG/ACT AERO nasal  inhaler; Place 2 sprays into the nose daily.  Dispense: 1 each; Refill: 12  4. Mixed hyperlipidemia Chronic.  Controlled.  Stable.  Asymptomatic.  Tolerating medication without myalgias without muscle weakness.  Will continue Crestor 20 mg once a day.  Review of previous lipid panel acceptable patient will continue on current dosing of 20 mg once a day. - rosuvastatin (CRESTOR) 20 MG tablet; Take 1 tablet (20 mg total) by mouth daily.  Dispense: 90 tablet; Refill: 1    Elizabeth Sauer, MD

## 2023-04-05 DIAGNOSIS — R42 Dizziness and giddiness: Secondary | ICD-10-CM | POA: Diagnosis not present

## 2023-04-05 DIAGNOSIS — G4733 Obstructive sleep apnea (adult) (pediatric): Secondary | ICD-10-CM | POA: Diagnosis not present

## 2023-04-05 DIAGNOSIS — G8929 Other chronic pain: Secondary | ICD-10-CM | POA: Diagnosis not present

## 2023-04-05 DIAGNOSIS — R55 Syncope and collapse: Secondary | ICD-10-CM | POA: Diagnosis not present

## 2023-04-05 DIAGNOSIS — I639 Cerebral infarction, unspecified: Secondary | ICD-10-CM | POA: Diagnosis not present

## 2023-04-05 DIAGNOSIS — R079 Chest pain, unspecified: Secondary | ICD-10-CM | POA: Diagnosis not present

## 2023-04-05 DIAGNOSIS — E782 Mixed hyperlipidemia: Secondary | ICD-10-CM | POA: Diagnosis not present

## 2023-04-05 DIAGNOSIS — R519 Headache, unspecified: Secondary | ICD-10-CM | POA: Diagnosis not present

## 2023-04-05 DIAGNOSIS — I2089 Other forms of angina pectoris: Secondary | ICD-10-CM | POA: Diagnosis not present

## 2023-04-19 DIAGNOSIS — C801 Malignant (primary) neoplasm, unspecified: Secondary | ICD-10-CM | POA: Diagnosis not present

## 2023-04-20 ENCOUNTER — Ambulatory Visit
Admission: RE | Admit: 2023-04-20 | Discharge: 2023-04-20 | Disposition: A | Discharge status: Home / Self Care | Payer: Medicare Other | Source: Ambulatory Visit | Attending: Family Medicine | Admitting: Family Medicine

## 2023-04-20 DIAGNOSIS — Z780 Asymptomatic menopausal state: Secondary | ICD-10-CM | POA: Diagnosis not present

## 2023-04-20 DIAGNOSIS — M810 Age-related osteoporosis without current pathological fracture: Secondary | ICD-10-CM | POA: Diagnosis not present

## 2023-04-21 ENCOUNTER — Other Ambulatory Visit: Payer: Self-pay

## 2023-04-21 ENCOUNTER — Encounter: Payer: Self-pay | Admitting: Family Medicine

## 2023-04-21 MED ORDER — ALENDRONATE SODIUM 70 MG PO TABS: 70.0000 mg | ORAL_TABLET | ORAL | 3 refills | Status: DC

## 2023-05-02 DIAGNOSIS — F5105 Insomnia due to other mental disorder: Secondary | ICD-10-CM | POA: Diagnosis not present

## 2023-05-02 DIAGNOSIS — F411 Generalized anxiety disorder: Secondary | ICD-10-CM | POA: Diagnosis not present

## 2023-05-12 DIAGNOSIS — R55 Syncope and collapse: Secondary | ICD-10-CM | POA: Diagnosis not present

## 2023-05-13 DIAGNOSIS — T8543XA Leakage of breast prosthesis and implant, initial encounter: Secondary | ICD-10-CM | POA: Diagnosis not present

## 2023-05-13 DIAGNOSIS — Z421 Encounter for breast reconstruction following mastectomy: Secondary | ICD-10-CM | POA: Diagnosis not present

## 2023-05-13 DIAGNOSIS — Z45811 Encounter for adjustment or removal of right breast implant: Secondary | ICD-10-CM | POA: Diagnosis not present

## 2023-05-13 DIAGNOSIS — Z45812 Encounter for adjustment or removal of left breast implant: Secondary | ICD-10-CM | POA: Diagnosis not present

## 2023-05-13 DIAGNOSIS — Z853 Personal history of malignant neoplasm of breast: Secondary | ICD-10-CM | POA: Diagnosis not present

## 2023-05-13 DIAGNOSIS — Z9013 Acquired absence of bilateral breasts and nipples: Secondary | ICD-10-CM | POA: Diagnosis not present

## 2023-05-20 DIAGNOSIS — C801 Malignant (primary) neoplasm, unspecified: Secondary | ICD-10-CM | POA: Diagnosis not present

## 2023-05-20 DIAGNOSIS — Z9889 Other specified postprocedural states: Secondary | ICD-10-CM | POA: Diagnosis not present

## 2023-06-10 DIAGNOSIS — C801 Malignant (primary) neoplasm, unspecified: Secondary | ICD-10-CM | POA: Diagnosis not present

## 2023-06-10 DIAGNOSIS — Z9889 Other specified postprocedural states: Secondary | ICD-10-CM | POA: Diagnosis not present

## 2023-06-29 DIAGNOSIS — R4189 Other symptoms and signs involving cognitive functions and awareness: Secondary | ICD-10-CM | POA: Diagnosis not present

## 2023-06-29 DIAGNOSIS — R55 Syncope and collapse: Secondary | ICD-10-CM | POA: Diagnosis not present

## 2023-06-29 DIAGNOSIS — G4733 Obstructive sleep apnea (adult) (pediatric): Secondary | ICD-10-CM | POA: Diagnosis not present

## 2023-06-29 DIAGNOSIS — R519 Headache, unspecified: Secondary | ICD-10-CM | POA: Diagnosis not present

## 2023-07-11 NOTE — Therapy (Signed)
 OUTPATIENT PHYSICAL THERAPY VESTIBULAR EVALUATION     Patient Name: Amanda Osborn MRN: [id number] DOB:[birth year], 68 y.o., female Today's Date: 07/12/2023  END OF SESSION:  PT End of Session - 07/12/23 1035     Visit Number 1    Number of Visits 17    Date for PT Re-Evaluation 09/06/23    PT Start Time 1018    PT Stop Time 1100    PT Time Calculation (min) 42 min    Equipment Utilized During Treatment Gait belt    Activity Tolerance Patient tolerated treatment well    Behavior During Therapy WFL for tasks assessed/performed             Past Medical History:  Diagnosis Date   Allergy    Anxiety    Arthritis    Breast cancer (HCC) 2006   BIL BREASTS-LOBULAR CARCINOMA IN SITU   Dizziness    Headache    History of CVA (cerebrovascular accident)    Hyperlipidemia    Osteoporosis    Stroke (HCC) 2019   NO RESIDUAL EFFECTS   Past Surgical History:  Procedure Laterality Date   AUGMENTATION MAMMAPLASTY Bilateral 2011   BREAST CYST ASPIRATION Bilateral    BREAST EXCISIONAL BIOPSY Left 2006   breast ca   BREAST LUMPECTOMY     CARDIAC CATHETERIZATION     COLONOSCOPY     COLONOSCOPY WITH PROPOFOL N/A 06/28/2016   Procedure: COLONOSCOPY WITH PROPOFOL;  Surgeon: Christena Deem, MD;  Location: [facility] ENDOSCOPY;  Service: Endoscopy;  Laterality: N/A;   CYSTOSCOPY W/ URETERAL STENT PLACEMENT Left 09/21/2019   Procedure: CYSTOSCOPY WITH RETROGRADE PYELOGRAM/URETERAL STENT PLACEMENT;  Surgeon: Sondra Come, MD;  Location: [facility] ORS;  Service: Urology;  Laterality: Left;   LYMPH NODE BIOPSY     NECK   MASTECTOMY Bilateral    TEE WITHOUT CARDIOVERSION N/A 12/30/2017   Procedure: TRANSESOPHAGEAL ECHOCARDIOGRAM (TEE);  Surgeon: Dalia Heading, MD;  Location: [facility] ORS;  Service: Cardiovascular;  Laterality: N/A;   TOTAL VAGINAL HYSTERECTOMY     URETEROSCOPY Left 09/21/2019   Procedure: URETEROSCOPY-DIAGNOSTIC;  Surgeon: Sondra Come, MD;  Location: [facility] ORS;   Service: Urology;  Laterality: Left;   VAGINAL HYSTERECTOMY     Patient Active Problem List   Diagnosis Date Noted   Pelvic pain in female 08/30/2019   Dizziness 03/21/2019   CVA (cerebral vascular accident) (HCC) 12/29/2017   Slurred speech 12/28/2017   Malignant neoplastic disease (HCC) 03/18/2015   Chondromalacia of patella 09/13/2014   Bursitis 09/13/2014   Allergic rhinitis 09/13/2014   Cancer (HCC) 09/13/2014   Cervical disc disease 09/13/2014    PCP: Duanne Limerick, MD REFERRING PROVIDER: Cristopher Peru, Kirtland Bouchard, MD   REFERRING DIAG:  Diagnosis  G43.809 (ICD-10-CM) - Vestibular migraine  R42 (ICD-10-CM) - Dizziness and giddiness    THERAPY DIAG:  Dizziness and giddiness  Unsteadiness on feet  Difficulty in walking, not elsewhere classified  ONSET DATE: 2019  Rationale for Evaluation and Treatment: Rehabilitation  SUBJECTIVE:   SUBJECTIVE STATEMENT: Pt had a mild stroke in 2019. She has dealt with dizziness since the stroke. She reports her dizziness has worsened over last two years, balance is not good, and she has headaches. Pt has experienced vertigo (says went to ED for this 2x) with vomiting. Now her dizziness is more feeling as if she is moving and not so much spinning. She reports 2 falls in her home in the last six months. She stumbles when she gets up and  is not sure on her feet/not steady. It's always there. Pt used to take Meclizine (not currently) and reports she is also taking medication for migraine 1x/day. She rates current dizziness as 3/10. Balance is noticeably worse in the dark, difficulty with uneven surfaces. Not using an AD, pt furniture surfs. Pt has a dull HA most of the time, comes on random/instantly. Pt has ringing in both ears (this started three weeks ago); saw ENT said hearing was OK but did not have ringing in ears at the time.  Pt reports she can't hear as well recently. This is an issue for both ears but mainly L side. No increased dizziness as  passenger in a car, some dizziness in a busy store, no dizziness with watching TV, but dizzy with scrolling on a phone/looking at it for a long time (feels bad when she looks back up). No symptom increase with head tilt back, but present with tilt forward/down, dizziness with quick horizontal head turns and pt reports sometimes she can feel somewhat dizzy rolling side to side in her bed. Pt reports hx of VNG testing which was unremarkable. Pt reports she has zero energy since this all began. Pt used to use treadmill, but has not recently due to symptoms.  Pt accompanied by: self  PERTINENT HISTORY:    PMH significant for athritis, breast CA (2006 bilat breast-lobular carcinoma in situ), headache, anxiety, hx of CVA, HLD, osteoporosis    PAIN:  Are you having pain?  Pt reports pain "all over" most noticably in her neck and back, says neck pain worsened in last 2-3 months, back problems ongoing for 7 years  PRECAUTIONS: Fall  RED FLAGS: None   WEIGHT BEARING RESTRICTIONS: No  FALLS: Has patient fallen in last 6 months?  Yes 2  LIVING ENVIRONMENT: deferred  PLOF: Independent  PATIENT GOALS: Any improvement will help (balance, dizzy symptoms)  OBJECTIVE:  Note: Objective measures were completed at Evaluation unless otherwise noted.  DIAGNOSTIC FINDINGS:  via chart  DG cervical spine 03/02/23: " FINDINGS:  No fracture, dislocation or subluxation. Minimal anterolisthesis C7  with respect to T1. No osteolytic or osteoblastic changes.  Prevertebral and cervical cranial soft tissues are unremarkable.   Degenerative disc disease noted with disc space narrowing and  marginal osteophytes most severely at C4-5.   IMPRESSION:  Degenerative changes. No acute osseous abnormalities. No change from  prior study    Electronically Signed    By: Layla Maw M.D.    On: 03/02/2023 22:26  "  MR BRAIN 03/01/23:  "FINDINGS: Brain: No acute infarction, hemorrhage, hydrocephalus,  extra-axial collection or mass lesion. Mild chronic small vessel ischemia in the cerebral white matter. Age normal brain volume. Small chronic left cerebellar infarct. No abnormal enhancement. No evidence of retrocochlear lesion.   Vascular: Normal flow voids.   Skull and upper cervical spine: Normal marrow signal.   Sinuses/Orbits: Negative.   IMPRESSION: 1. No recent insult or specific explanation for symptoms. 2. Small chronic left cerebellar infarct present since at least 2019. Mild chronic small vessel ischemia.     Electronically Signed   By: Tiburcio Pea M.D.   On: 03/01/2023 07:29"   CT ANGIO HEAD NECK 1024/24: " IMPRESSION: 1. No intracranial large vessel occlusion or significant stenosis. 2. No hemodynamically significant stenosis in the neck. 3. No evidence of vertebral or carotid artery dissection.     Electronically Signed   By: Wiliam Ke M.D.   On: 02/03/2023 18:37"  CT HEAD 02/03/23: "  FINDINGS: Brain: No evidence of acute infarction, hemorrhage, hydrocephalus, extra-axial collection or mass lesion/mass effect. Small remote cerebellar infarct.   Vascular: No hyperdense vessel.   Skull: Normal. Negative for fracture or focal lesion.   Sinuses/Orbits: Clear sinuses.  No acute orbital findings.   Other: No mastoid effusions.   IMPRESSION: No evidence of acute intracranial abnormality.     Electronically Signed   By: Feliberto Harts M.D.   On: 02/03/2023 15:32"  COGNITION: Overall cognitive status: Within functional limits for tasks assessed   SENSATION: Not assessed   POSTURE:  Elevated shoulders L more than R   Cervical ROM:   Not assessed   STRENGTH:   LOWER EXTREMITY MMT:  deferred  BED MOBILITY:  Some impairment due to dizziness (such as rolling)  TRANSFERS: Assistive device utilized: None  Sit to stand: Complete Independence Stand to sit: Complete Independence Chair to chair: Complete Independence *pt did  report impairment with sit>stand with increased dizziness symptoms    GAIT: Gait pattern:  increased variability in BOS/unsteadiness with scanning Distance walked: clinic distances Assistive device utilized: None Level of assistance: SBA and CGA  FUNCTIONAL TESTS:  Dynamic Gait Index: 21/24  PATIENT SURVEYS:  ABC scale 48.69 DHI 68  VESTIBULAR ASSESSMENT:  OCULOMOTOR EXAM:  Ocular Alignment: normal  Ocular ROM: No Limitations  Spontaneous Nystagmus: absent  Gaze-Induced Nystagmus: absent  Smooth Pursuits: saccades - mild, likely WNL for age  Saccades: hypometric/undershoots with corrective saccade   Convergence/Divergence: sees double approx 4" from face    VESTIBULAR - OCULAR REFLEX: the below 3 tests to be assessed next 1-2 visits  Slow VOR:   VOR Cancellation:   Head-Impulse Test:      POSITIONAL TESTING: not assessed, pt reports hx positional testing with no findings             TREATMENT DATE: 07/12/23  NMR Review/education of assessment findings, plan, goals, recommendations & education for symptom modulation (easier/gentler activities when in symptom flare, take sunglasses with you if having light sensitivity). PT also recommends pt follow-up with her ENT due to new onset of bilateral tinnitus and perception of decreased hearing.     PATIENT EDUCATION: Education details: patient Person educated: Patient Education method: Explanation Education comprehension: verbalized understanding  HOME EXERCISE PROGRAM:  GOALS:  Goals reviewed with patient? Yes, initiated, will continue review with completion of further assessment/testing  SHORT TERM GOALS: Target date: 08/09/2023   Patient will be independent in home exercise program to improve dizziness, balance and mobility for better functional independence with ADLs and increased QOL. Baseline: Goal status: INITIAL   LONG TERM GOALS: Target date: 09/06/2023     1.  Patient will reduce dizziness handicap  inventory score to <50, for less dizziness with ADLs and increased safety with home and work tasks.  Baseline:  68 Goal status: INITIAL  2.  Patient will increase ABC scale score >80% to demonstrate better functional mobility and better confidence with ADLs.  Baseline:  48.69% Goal status: INITIAL  3.  Patient will increase dynamic gait index score to >22/24 as to demonstrate  improved dynamic gait balance for better safety with community/home ambulation and ability to scan environment without LOB.  Baseline: 21/24 Goal status: INITIAL  4. The pt will report ability to complete cardio program at home (e.g. with using her treadmill) for at least 10 minutes 3x/week with no greater than mild dizzy symptoms to return to PLOF.  Baseline: not currently completing cardio program  Goal status: INITIAL  5. The pt will exhibit ability to complete at least 10 reps of cervical flexion>neutral cervical position without increase in dizziness symptoms from baseline for improved mobility/ability to complete ADLs.  Baseline: movement currently triggers symptom increase  Goal status: INITIAL   ASSESSMENT:  CLINICAL IMPRESSION: Patient is a pleasant 68 y.o. female who was seen today for physical therapy evaluation and treatment for dizziness with dx of vestibular migraine. Pt also with concerns of decreased balance with recent hx of 2 falls. Exam reveals impairments of dizziness, saccades, smooth pursuits, convergence, gait/balance, pain, and severe handicap due to dizziness per DHI, and decreased/impaired balance confidence per ABC questionnaire. ADLs are currently being impacted by limited mobility as multiple positions can trigger/increase patient's dizzy symptoms. The pt will benefit from further skilled PT to improve deficits in order to increase balance, QOL, decrease fall risk and dizziness. The pt will also benefit from PT to develop symptom management program.  OBJECTIVE IMPAIRMENTS: Abnormal gait,  decreased activity tolerance, decreased balance, decreased coordination, decreased mobility, difficulty walking, dizziness, improper body mechanics, postural dysfunction, and pain.   ACTIVITY LIMITATIONS: carrying, bending, standing, stairs, bed mobility, locomotion level, and ADLs generally impacted when pt with increased dizzy symptoms  PARTICIPATION LIMITATIONS: meal prep, cleaning, driving, shopping, community activity, and yard work  PERSONAL FACTORS: Age, Sex, Time since onset of injury/illness/exacerbation, and 3+ comorbidities: PMH significant for athritis, breast CA (2006 bilat breast-lobular carcinoma in situ), headache, anxiety, hx of CVA, HLD, osteoporosis    are also affecting patient's functional outcome.   REHAB POTENTIAL: Good  CLINICAL DECISION MAKING: Evolving/moderate complexity  EVALUATION COMPLEXITY: Moderate   PLAN:  PT FREQUENCY: 1-2x/week  PT DURATION: 8 weeks  PLANNED INTERVENTIONS: 97164- PT Re-evaluation, 97110-Therapeutic exercises, 97530- Therapeutic activity, 97112- Neuromuscular re-education, 97535- Self Care, 16109- Manual therapy, [phone]- Gait training, [phone]- Canalith repositioning, Patient/Family education, Balance training, Stair training, Taping, Joint mobilization, Spinal mobilization, Vestibular training, DME instructions, Cryotherapy, and Moist heat  PLAN FOR NEXT SESSION: VOR assessments, MMST, HEP, quick positional screen if indicated/time permits   Baird Kay, PT 07/12/2023, 2:27 PM

## 2023-07-12 ENCOUNTER — Ambulatory Visit: Attending: Neurology

## 2023-07-12 DIAGNOSIS — M6281 Muscle weakness (generalized): Secondary | ICD-10-CM | POA: Insufficient documentation

## 2023-07-12 DIAGNOSIS — R42 Dizziness and giddiness: Secondary | ICD-10-CM | POA: Diagnosis not present

## 2023-07-12 DIAGNOSIS — R2681 Unsteadiness on feet: Secondary | ICD-10-CM | POA: Diagnosis not present

## 2023-07-12 DIAGNOSIS — M542 Cervicalgia: Secondary | ICD-10-CM | POA: Diagnosis not present

## 2023-07-12 DIAGNOSIS — R262 Difficulty in walking, not elsewhere classified: Secondary | ICD-10-CM | POA: Diagnosis not present

## 2023-07-14 ENCOUNTER — Ambulatory Visit

## 2023-07-14 DIAGNOSIS — M542 Cervicalgia: Secondary | ICD-10-CM | POA: Diagnosis not present

## 2023-07-14 DIAGNOSIS — R42 Dizziness and giddiness: Secondary | ICD-10-CM | POA: Diagnosis not present

## 2023-07-14 DIAGNOSIS — M6281 Muscle weakness (generalized): Secondary | ICD-10-CM | POA: Diagnosis not present

## 2023-07-14 DIAGNOSIS — R262 Difficulty in walking, not elsewhere classified: Secondary | ICD-10-CM | POA: Diagnosis not present

## 2023-07-14 DIAGNOSIS — R2681 Unsteadiness on feet: Secondary | ICD-10-CM | POA: Diagnosis not present

## 2023-07-14 NOTE — Therapy (Signed)
 OUTPATIENT PHYSICAL THERAPY VESTIBULAR TREATMENT     Patient Name: Amanda Osborn MRN: [id number] DOB:[birth year], 68 y.o., female Today's Date: 07/14/2023  END OF SESSION:  PT End of Session - 07/14/23 1016     Visit Number 2    Number of Visits 17    Date for PT Re-Evaluation 09/06/23    PT Start Time 1017    PT Stop Time 1100    PT Time Calculation (min) 43 min    Equipment Utilized During Treatment Gait belt    Activity Tolerance Patient tolerated treatment well    Behavior During Therapy WFL for tasks assessed/performed             Past Medical History:  Diagnosis Date   Allergy    Anxiety    Arthritis    Breast cancer (HCC) 2006   BIL BREASTS-LOBULAR CARCINOMA IN SITU   Dizziness    Headache    History of CVA (cerebrovascular accident)    Hyperlipidemia    Osteoporosis    Stroke (HCC) 2019   NO RESIDUAL EFFECTS   Past Surgical History:  Procedure Laterality Date   AUGMENTATION MAMMAPLASTY Bilateral 2011   BREAST CYST ASPIRATION Bilateral    BREAST EXCISIONAL BIOPSY Left 2006   breast ca   BREAST LUMPECTOMY     CARDIAC CATHETERIZATION     COLONOSCOPY     COLONOSCOPY WITH PROPOFOL N/A 06/28/2016   Procedure: COLONOSCOPY WITH PROPOFOL;  Surgeon: Christena Deem, MD;  Location: [facility] ENDOSCOPY;  Service: Endoscopy;  Laterality: N/A;   CYSTOSCOPY W/ URETERAL STENT PLACEMENT Left 09/21/2019   Procedure: CYSTOSCOPY WITH RETROGRADE PYELOGRAM/URETERAL STENT PLACEMENT;  Surgeon: Sondra Come, MD;  Location: [facility] ORS;  Service: Urology;  Laterality: Left;   LYMPH NODE BIOPSY     NECK   MASTECTOMY Bilateral    TEE WITHOUT CARDIOVERSION N/A 12/30/2017   Procedure: TRANSESOPHAGEAL ECHOCARDIOGRAM (TEE);  Surgeon: Dalia Heading, MD;  Location: [facility] ORS;  Service: Cardiovascular;  Laterality: N/A;   TOTAL VAGINAL HYSTERECTOMY     URETEROSCOPY Left 09/21/2019   Procedure: URETEROSCOPY-DIAGNOSTIC;  Surgeon: Sondra Come, MD;  Location: [facility] ORS;   Service: Urology;  Laterality: Left;   VAGINAL HYSTERECTOMY     Patient Active Problem List   Diagnosis Date Noted   Pelvic pain in female 08/30/2019   Dizziness 03/21/2019   CVA (cerebral vascular accident) (HCC) 12/29/2017   Slurred speech 12/28/2017   Malignant neoplastic disease (HCC) 03/18/2015   Chondromalacia of patella 09/13/2014   Bursitis 09/13/2014   Allergic rhinitis 09/13/2014   Cancer (HCC) 09/13/2014   Cervical disc disease 09/13/2014    PCP: Duanne Limerick, MD REFERRING PROVIDER: Cristopher Peru, Kirtland Bouchard, MD   REFERRING DIAG:  Diagnosis  G43.809 (ICD-10-CM) - Vestibular migraine  R42 (ICD-10-CM) - Dizziness and giddiness    THERAPY DIAG:  Dizziness and giddiness  Cervicalgia  Muscle weakness (generalized)  Unsteadiness on feet  ONSET DATE: 2019  Rationale for Evaluation and Treatment: Rehabilitation  SUBJECTIVE:   SUBJECTIVE STATEMENT:  Pt rates symptoms as a 6/10 currently at baseline. Pt feeling really tired today. Pt notes unsteadiness when she tries to slow down her walking speed.   Pt accompanied by: self  PERTINENT HISTORY:   FROM EVAL: Pt had a mild stroke in 2019. She has dealt with dizziness since the stroke. She reports her dizziness has worsened over last two years, balance is not good, and she has headaches. Pt has experienced vertigo (says went to ED for  this 2x) with vomiting. Now her dizziness is more feeling as if she is moving and not so much spinning. She reports 2 falls in her home in the last six months. She stumbles when she gets up and is not sure on her feet/not steady. It's always there. Pt used to take Meclizine (not currently) and reports she is also taking medication for migraine 1x/day. She rates current dizziness as 3/10. Balance is noticeably worse in the dark, difficulty with uneven surfaces. Not using an AD, pt furniture surfs. Pt has a dull HA most of the time, comes on random/instantly. Pt has ringing in both ears (this  started three weeks ago); saw ENT said hearing was OK but did not have ringing in ears at the time.  Pt reports she can't hear as well recently. This is an issue for both ears but mainly L side. No increased dizziness as passenger in a car, some dizziness in a busy store, no dizziness with watching TV, but dizzy with scrolling on a phone/looking at it for a long time (feels bad when she looks back up). No symptom increase with head tilt back, but present with tilt forward/down, dizziness with quick horizontal head turns and pt reports sometimes she can feel somewhat dizzy rolling side to side in her bed. Pt reports hx of VNG testing which was unremarkable. Pt reports she has zero energy since this all began. Pt used to use treadmill, but has not recently due to symptoms. PMH significant for athritis, breast CA (2006 bilat breast-lobular carcinoma in situ), headache, anxiety, hx of CVA, HLD, osteoporosis    PAIN:  Are you having pain?  Pt reports pain "all over" most noticably in her neck and back, says neck pain worsened in last 2-3 months, back problems ongoing for 7 years  PRECAUTIONS: Fall  RED FLAGS: None   WEIGHT BEARING RESTRICTIONS: No  FALLS: Has patient fallen in last 6 months?  Yes 2  LIVING ENVIRONMENT: deferred  PLOF: Independent  PATIENT GOALS: Any improvement will help (balance, dizzy symptoms)  OBJECTIVE:  Note: Objective measures were completed at Evaluation unless otherwise noted.  DIAGNOSTIC FINDINGS:  via chart  DG cervical spine 03/02/23: " FINDINGS:  No fracture, dislocation or subluxation. Minimal anterolisthesis C7  with respect to T1. No osteolytic or osteoblastic changes.  Prevertebral and cervical cranial soft tissues are unremarkable.   Degenerative disc disease noted with disc space narrowing and  marginal osteophytes most severely at C4-5.   IMPRESSION:  Degenerative changes. No acute osseous abnormalities. No change from  prior study     Electronically Signed    By: Layla Maw M.D.    On: 03/02/2023 22:26  "  MR BRAIN 03/01/23:  "FINDINGS: Brain: No acute infarction, hemorrhage, hydrocephalus, extra-axial collection or mass lesion. Mild chronic small vessel ischemia in the cerebral white matter. Age normal brain volume. Small chronic left cerebellar infarct. No abnormal enhancement. No evidence of retrocochlear lesion.   Vascular: Normal flow voids.   Skull and upper cervical spine: Normal marrow signal.   Sinuses/Orbits: Negative.   IMPRESSION: 1. No recent insult or specific explanation for symptoms. 2. Small chronic left cerebellar infarct present since at least 2019. Mild chronic small vessel ischemia.     Electronically Signed   By: Tiburcio Pea M.D.   On: 03/01/2023 07:29"   CT ANGIO HEAD NECK 1024/24: " IMPRESSION: 1. No intracranial large vessel occlusion or significant stenosis. 2. No hemodynamically significant stenosis in the neck. 3. No evidence  of vertebral or carotid artery dissection.     Electronically Signed   By: Wiliam Ke M.D.   On: 02/03/2023 18:37"  CT HEAD 02/03/23: " FINDINGS: Brain: No evidence of acute infarction, hemorrhage, hydrocephalus, extra-axial collection or mass lesion/mass effect. Small remote cerebellar infarct.   Vascular: No hyperdense vessel.   Skull: Normal. Negative for fracture or focal lesion.   Sinuses/Orbits: Clear sinuses.  No acute orbital findings.   Other: No mastoid effusions.   IMPRESSION: No evidence of acute intracranial abnormality.     Electronically Signed   By: Feliberto Harts M.D.   On: 02/03/2023 15:32"  COGNITION: Overall cognitive status: Within functional limits for tasks assessed   SENSATION: Not assessed   POSTURE:  Elevated shoulders L more than R   Cervical ROM:   Not assessed   STRENGTH:   LOWER EXTREMITY MMT:  deferred  BED MOBILITY:  Some impairment due to dizziness (such as  rolling)  TRANSFERS: Assistive device utilized: None  Sit to stand: Complete Independence Stand to sit: Complete Independence Chair to chair: Complete Independence *pt did report impairment with sit>stand with increased dizziness symptoms    GAIT: Gait pattern:  increased variability in BOS/unsteadiness with scanning Distance walked: clinic distances Assistive device utilized: None Level of assistance: SBA and CGA  FUNCTIONAL TESTS:  Dynamic Gait Index: 21/24  PATIENT SURVEYS:  ABC scale 48.69 DHI 68  VESTIBULAR ASSESSMENT:  OCULOMOTOR EXAM:  Ocular Alignment: normal  Ocular ROM: No Limitations  Spontaneous Nystagmus: absent  Gaze-Induced Nystagmus: absent  Smooth Pursuits: saccades - mild, likely WNL for age  Saccades: hypometric/undershoots with corrective saccade   Convergence/Divergence: sees double approx 4" from face     VESTIBULAR - OCULAR REFLEX: 07/14/2023  Slow VOR: WNL  VOR Cancellation: WNL   Head-Impulse Test:  WNL bilat       POSITIONAL TESTING: not assessed, pt reports hx positional testing with no findings             TREATMENT DATE: 07/14/23  Heat donned to bilat shoulders while pt completes testing and multiple interventions  (skin WNL prior to and upon removal of heat) pt reports heat feels good to neck/shoulders.  NMR:  Cervical screen for vestibular testing: sufficient ROM but pain limited at end ranges flex/ext and rotation -- all make pt dizzy   VESTIBULAR - OCULAR REFLEX: 07/14/2023  Slow VOR: WNL  VOR Cancellation: WNL   Head-Impulse Test:  WNL bilat    Modified Motion Sensitivity Test  Movement Intensity (change from baseline, 0-10, no change=0, severe change=10) Duration  <5 sec = 0  5-10 s = 1 11-20 s = 2 21-30 s = 3 >30 s = 4 Score (Intensity + Duration)  5x Horizontal head turns 2 4 6   5x Vertical head turns 2 4 6   5x Right diagonal head turns (upper left quadrant down to right) 2 4 6   5x Left diagonal head turns (upper  right quadrant down to left) 5 4 9   5x Trunk Bends (bending knees reaching to floor) 0    5x Right quarter body urns (look over right shoulder with trunk rotation, feet planted) 0    5x Left quarter body turns (look over left shoulder with trunk rotation, feet planted) 1 2 3   1x 360 degree turn to right 2 4 6   1x 360 degree turn to left 2 4 6   5x VOR cancellation (follow thumbs horizontally with head/trunk rotation x45 degrees each way) 2 4 6   TOTAL  SCORE      MSQ = total score x (# of positions)/14   0-10 mild range 11-30 moderate range 31-100 severe range   27.4  moderate     Seated habituation: Vertical head turns 2x5 Discussed addition of horizontal for 2x5 but not performed to prevent significant symptom flare  SLB - initial difficulty with warm-up attempts, then able to sustain SLB at least 10 sec each LE.   TA: Treadmill  conditioning x 5 min up to 3.2 mph with warm-up and cool-down. Feels OK. Discussed how to complete safely at home, perform when not significantly dizzy and use UE support.   UT stretch for promoting tissue extensibility, decreased pain 2x30 sec each side  Seated scapular retraction for pain modulation 2x10  Reviewed HEP & provided instruction on when to perform/when to stop and how to monitor symptoms and complete interventions safely    PATIENT EDUCATION: Education details: patient Person educated: Patient Education method: Explanation Education comprehension: verbalized understanding  HOME EXERCISE PROGRAM:  Access Code: [email or web address] URL: [email or web address] Date: 07/14/2023 Prepared by: Temple Pacini  Exercises - Seated Head Nods Vestibular Habituation  - 1 x daily - 6-7 x weekly - 2 sets - 5 reps - Vestibular Habituation - Seated Horizontal Head Rotation  - 1 x daily - 6-7 x weekly - 2 sets - 5 reps - Seated Scapular Retraction  - 1 x daily - 7 x weekly - 2 sets - 10 reps - Seated Upper Trapezius Stretch  - 1 x daily - 7 x  weekly - 2 sets - 1 reps - 30 seconds/side hold - Walking on Treadmill  - 1 x daily - 3-5 x weekly - 1 sets - 1 reps - 5 minutes hold  - hold on to treadmill  GOALS:  Goals reviewed with patient? Yes, initiated, will continue review with completion of further assessment/testing  SHORT TERM GOALS: Target date: 08/11/2023   Patient will be independent in home exercise program to improve dizziness, balance and mobility for better functional independence with ADLs and increased QOL. Baseline: Goal status: INITIAL   LONG TERM GOALS: Target date: 09/08/2023     1.  Patient will reduce dizziness handicap inventory score to <50, for less dizziness with ADLs and increased safety with home and work tasks.  Baseline:  68 Goal status: INITIAL  2.  Patient will increase ABC scale score >80% to demonstrate better functional mobility and better confidence with ADLs.  Baseline:  48.69% Goal status: INITIAL  3.  Patient will increase dynamic gait index score to >22/24 as to demonstrate  improved dynamic gait balance for better safety with community/home ambulation and ability to scan environment without LOB.  Baseline: 21/24 Goal status: INITIAL  4. The pt will report ability to complete cardio program at home (e.g. with using her treadmill) for at least 10 minutes 3x/week with no greater than mild dizzy symptoms to return to PLOF.  Baseline: not currently completing cardio program  Goal status: INITIAL  5. The pt will exhibit ability to complete at least 10 reps of cervical flexion>neutral cervical position without increase in dizziness symptoms from baseline for improved mobility/ability to complete ADLs.  Baseline: movement currently triggers symptom increase  Goal status: INITIAL   ASSESSMENT:  CLINICAL IMPRESSION: Further assessment completed. Pt with normal VOR screening. Pt found to have moderate motion sensitivity per MMST. HEP initiated to address habituation to activities and  conditioning and pain modulation for overall symptom management and reduction. The pt will benefit  from further skilled PT to improve deficits in order to increase balance, QOL, decrease fall risk and dizziness.  OBJECTIVE IMPAIRMENTS: Abnormal gait, decreased activity tolerance, decreased balance, decreased coordination, decreased mobility, difficulty walking, dizziness, improper body mechanics, postural dysfunction, and pain.   ACTIVITY LIMITATIONS: carrying, bending, standing, stairs, bed mobility, locomotion level, and ADLs generally impacted when pt with increased dizzy symptoms  PARTICIPATION LIMITATIONS: meal prep, cleaning, driving, shopping, community activity, and yard work  PERSONAL FACTORS: Age, Sex, Time since onset of injury/illness/exacerbation, and 3+ comorbidities: PMH significant for athritis, breast CA (2006 bilat breast-lobular carcinoma in situ), headache, anxiety, hx of CVA, HLD, osteoporosis    are also affecting patient's functional outcome.   REHAB POTENTIAL: Good  CLINICAL DECISION MAKING: Evolving/moderate complexity  EVALUATION COMPLEXITY: Moderate   PLAN:  PT FREQUENCY: 1-2x/week  PT DURATION: 8 weeks  PLANNED INTERVENTIONS: 97164- PT Re-evaluation, 97110-Therapeutic exercises, 97530- Therapeutic activity, 97112- Neuromuscular re-education, 97535- Self Care, [id number]- Manual therapy, [phone]- Gait training, [phone]- Canalith repositioning, Patient/Family education, Balance training, Stair training, Taping, Joint mobilization, Spinal mobilization, Vestibular training, DME instructions, Cryotherapy, and Moist heat  PLAN FOR NEXT SESSION: VOR assessments, MMST, HEP, quick positional screen if indicated/time permits   Baird Kay, PT 07/14/2023, 11:29 AM

## 2023-07-16 ENCOUNTER — Other Ambulatory Visit: Payer: Self-pay | Admitting: Family Medicine

## 2023-07-19 ENCOUNTER — Ambulatory Visit

## 2023-07-19 DIAGNOSIS — M6281 Muscle weakness (generalized): Secondary | ICD-10-CM | POA: Diagnosis not present

## 2023-07-19 DIAGNOSIS — R2681 Unsteadiness on feet: Secondary | ICD-10-CM | POA: Diagnosis not present

## 2023-07-19 DIAGNOSIS — M542 Cervicalgia: Secondary | ICD-10-CM

## 2023-07-19 DIAGNOSIS — R42 Dizziness and giddiness: Secondary | ICD-10-CM | POA: Diagnosis not present

## 2023-07-19 DIAGNOSIS — R262 Difficulty in walking, not elsewhere classified: Secondary | ICD-10-CM | POA: Diagnosis not present

## 2023-07-19 NOTE — Therapy (Signed)
 OUTPATIENT PHYSICAL THERAPY VESTIBULAR TREATMENT     Patient Name: Amanda Osborn MRN: [id number] DOB:[birth year], 68 y.o., female Today's Date: 07/19/2023  END OF SESSION:  PT End of Session - 07/19/23 1014     Visit Number 3    Number of Visits 17    Date for PT Re-Evaluation 09/06/23    PT Start Time 1018    PT Stop Time 1058    PT Time Calculation (min) 40 min    Equipment Utilized During Treatment Gait belt    Activity Tolerance Patient tolerated treatment well    Behavior During Therapy WFL for tasks assessed/performed             Past Medical History:  Diagnosis Date   Allergy    Anxiety    Arthritis    Breast cancer (HCC) 2006   BIL BREASTS-LOBULAR CARCINOMA IN SITU   Dizziness    Headache    History of CVA (cerebrovascular accident)    Hyperlipidemia    Osteoporosis    Stroke (HCC) 2019   NO RESIDUAL EFFECTS   Past Surgical History:  Procedure Laterality Date   AUGMENTATION MAMMAPLASTY Bilateral 2011   BREAST CYST ASPIRATION Bilateral    BREAST EXCISIONAL BIOPSY Left 2006   breast ca   BREAST LUMPECTOMY     CARDIAC CATHETERIZATION     COLONOSCOPY     COLONOSCOPY WITH PROPOFOL N/A 06/28/2016   Procedure: COLONOSCOPY WITH PROPOFOL;  Surgeon: Christena Deem, MD;  Location: [facility] ENDOSCOPY;  Service: Endoscopy;  Laterality: N/A;   CYSTOSCOPY W/ URETERAL STENT PLACEMENT Left 09/21/2019   Procedure: CYSTOSCOPY WITH RETROGRADE PYELOGRAM/URETERAL STENT PLACEMENT;  Surgeon: Sondra Come, MD;  Location: [facility] ORS;  Service: Urology;  Laterality: Left;   LYMPH NODE BIOPSY     NECK   MASTECTOMY Bilateral    TEE WITHOUT CARDIOVERSION N/A 12/30/2017   Procedure: TRANSESOPHAGEAL ECHOCARDIOGRAM (TEE);  Surgeon: Dalia Heading, MD;  Location: [facility] ORS;  Service: Cardiovascular;  Laterality: N/A;   TOTAL VAGINAL HYSTERECTOMY     URETEROSCOPY Left 09/21/2019   Procedure: URETEROSCOPY-DIAGNOSTIC;  Surgeon: Sondra Come, MD;  Location: [facility] ORS;   Service: Urology;  Laterality: Left;   VAGINAL HYSTERECTOMY     Patient Active Problem List   Diagnosis Date Noted   Pelvic pain in female 08/30/2019   Dizziness 03/21/2019   CVA (cerebral vascular accident) (HCC) 12/29/2017   Slurred speech 12/28/2017   Malignant neoplastic disease (HCC) 03/18/2015   Chondromalacia of patella 09/13/2014   Bursitis 09/13/2014   Allergic rhinitis 09/13/2014   Cancer (HCC) 09/13/2014   Cervical disc disease 09/13/2014    PCP: Duanne Limerick, MD REFERRING PROVIDER: Cristopher Peru, Kirtland Bouchard, MD   REFERRING DIAG:  Diagnosis  G43.809 (ICD-10-CM) - Vestibular migraine  R42 (ICD-10-CM) - Dizziness and giddiness    THERAPY DIAG:  Cervicalgia  Dizziness and giddiness  ONSET DATE: 2019  Rationale for Evaluation and Treatment: Rehabilitation  SUBJECTIVE:   SUBJECTIVE STATEMENT:  Pt ambulated one mile on her treadmill. She has been doing her HEP every day; habituation HEP interventions made her mildly dizzy.  Pt accompanied by: self  PERTINENT HISTORY:   FROM EVAL: Pt had a mild stroke in 2019. She has dealt with dizziness since the stroke. She reports her dizziness has worsened over last two years, balance is not good, and she has headaches. Pt has experienced vertigo (says went to ED for this 2x) with vomiting. Now her dizziness is more feeling as if she  is moving and not so much spinning. She reports 2 falls in her home in the last six months. She stumbles when she gets up and is not sure on her feet/not steady. It's always there. Pt used to take Meclizine (not currently) and reports she is also taking medication for migraine 1x/day. She rates current dizziness as 3/10. Balance is noticeably worse in the dark, difficulty with uneven surfaces. Not using an AD, pt furniture surfs. Pt has a dull HA most of the time, comes on random/instantly. Pt has ringing in both ears (this started three weeks ago); saw ENT said hearing was OK but did not have ringing in  ears at the time.  Pt reports she can't hear as well recently. This is an issue for both ears but mainly L side. No increased dizziness as passenger in a car, some dizziness in a busy store, no dizziness with watching TV, but dizzy with scrolling on a phone/looking at it for a long time (feels bad when she looks back up). No symptom increase with head tilt back, but present with tilt forward/down, dizziness with quick horizontal head turns and pt reports sometimes she can feel somewhat dizzy rolling side to side in her bed. Pt reports hx of VNG testing which was unremarkable. Pt reports she has zero energy since this all began. Pt used to use treadmill, but has not recently due to symptoms. PMH significant for athritis, breast CA (2006 bilat breast-lobular carcinoma in situ), headache, anxiety, hx of CVA, HLD, osteoporosis    PAIN:  Are you having pain?  Pt reports pain "all over" most noticably in her neck and back, says neck pain worsened in last 2-3 months, back problems ongoing for 7 years  PRECAUTIONS: Fall  RED FLAGS: None   WEIGHT BEARING RESTRICTIONS: No  FALLS: Has patient fallen in last 6 months?  Yes 2  LIVING ENVIRONMENT: deferred  PLOF: Independent  PATIENT GOALS: Any improvement will help (balance, dizzy symptoms)  OBJECTIVE:  Note: Objective measures were completed at Evaluation unless otherwise noted.  DIAGNOSTIC FINDINGS:  via chart  DG cervical spine 03/02/23: " FINDINGS:  No fracture, dislocation or subluxation. Minimal anterolisthesis C7  with respect to T1. No osteolytic or osteoblastic changes.  Prevertebral and cervical cranial soft tissues are unremarkable.   Degenerative disc disease noted with disc space narrowing and  marginal osteophytes most severely at C4-5.   IMPRESSION:  Degenerative changes. No acute osseous abnormalities. No change from  prior study    Electronically Signed    By: Layla Maw M.D.    On: 03/02/2023 22:26  "  MR BRAIN  03/01/23:  "FINDINGS: Brain: No acute infarction, hemorrhage, hydrocephalus, extra-axial collection or mass lesion. Mild chronic small vessel ischemia in the cerebral white matter. Age normal brain volume. Small chronic left cerebellar infarct. No abnormal enhancement. No evidence of retrocochlear lesion.   Vascular: Normal flow voids.   Skull and upper cervical spine: Normal marrow signal.   Sinuses/Orbits: Negative.   IMPRESSION: 1. No recent insult or specific explanation for symptoms. 2. Small chronic left cerebellar infarct present since at least 2019. Mild chronic small vessel ischemia.     Electronically Signed   By: Tiburcio Pea M.D.   On: 03/01/2023 07:29"   CT ANGIO HEAD NECK 1024/24: " IMPRESSION: 1. No intracranial large vessel occlusion or significant stenosis. 2. No hemodynamically significant stenosis in the neck. 3. No evidence of vertebral or carotid artery dissection.     Electronically Signed  By: Wiliam Ke M.D.   On: 02/03/2023 18:37"  CT HEAD 02/03/23: " FINDINGS: Brain: No evidence of acute infarction, hemorrhage, hydrocephalus, extra-axial collection or mass lesion/mass effect. Small remote cerebellar infarct.   Vascular: No hyperdense vessel.   Skull: Normal. Negative for fracture or focal lesion.   Sinuses/Orbits: Clear sinuses.  No acute orbital findings.   Other: No mastoid effusions.   IMPRESSION: No evidence of acute intracranial abnormality.     Electronically Signed   By: Feliberto Harts M.D.   On: 02/03/2023 15:32"  COGNITION: Overall cognitive status: Within functional limits for tasks assessed   SENSATION: Not assessed   POSTURE:  Elevated shoulders L more than R   Cervical ROM:   07/19/23 Cervical AROM measurements: Extension 55 deg, some pain  Flexion: 50 deg Lateral flexion 40 deg bilateral and pain-limited  Rotation 73 R, 63 L - some pain limitation   STRENGTH:   LOWER EXTREMITY MMT:   deferred  BED MOBILITY:  Some impairment due to dizziness (such as rolling)  TRANSFERS: Assistive device utilized: None  Sit to stand: Complete Independence Stand to sit: Complete Independence Chair to chair: Complete Independence *pt did report impairment with sit>stand with increased dizziness symptoms    GAIT: Gait pattern:  increased variability in BOS/unsteadiness with scanning Distance walked: clinic distances Assistive device utilized: None Level of assistance: SBA and CGA  FUNCTIONAL TESTS:  Dynamic Gait Index: 21/24  PATIENT SURVEYS:  ABC scale 48.69 DHI 68  VESTIBULAR ASSESSMENT:  OCULOMOTOR EXAM:  Ocular Alignment: normal  Ocular ROM: No Limitations  Spontaneous Nystagmus: absent  Gaze-Induced Nystagmus: absent  Smooth Pursuits: saccades - mild, likely WNL for age  Saccades: hypometric/undershoots with corrective saccade   Convergence/Divergence: sees double approx 4" from face     VESTIBULAR - OCULAR REFLEX: 07/14/2023  Slow VOR: WNL  VOR Cancellation: WNL   Head-Impulse Test:  WNL bilat       POSITIONAL TESTING: not assessed, pt reports hx positional testing with no findings             TREATMENT DATE: 07/19/23  TE:  Cervical AROM measurements: Extension 55 deg, some pain  Flexion: 50 deg Lateral flexion 40 deg bilateral and pain-limited  Rotation 73 R, 63 L - some pain limitation   Chin tucks supine 2x10 Seated chin to chest stretch with rhomboids stretch  2x30 sec UT stretch x30 sec each side Cervical rotation stretch x30 sec each side Standing scapular retraction 10x   Standing mini squats with UE support 2x10 no dizziness, rates medium difficulty for strength   Manual: heat donned bilat shoulders, nothing currently applied to skin to prevent safe use of heat, pt reports heat feels good/helpful STM to bilat UT, cervical paraspinals, and posterior shoulder mm, addition of suboccip release and TrP release. Prominent TrPs in each UT,  L>R. PT brings pt through UT stretch and cervical side bend/rotation stretch each side for long duration while providing manual.  NMR: Head nods seated 5x - no dizziness --progressed to standing 5x no dizziness Horizontal head turns seated 5x  - no dizziness  --progressed to standing 5x - no dizziness   Gait with vertical and horizontal head turns over 10 meters x 2-3 rounds of each. Unsteadiness but no dizziness      PATIENT EDUCATION: Education details: exercise technique Person educated: Patient Education method: Explanation Education comprehension: verbalized understanding  HOME EXERCISE PROGRAM:  Access Code: [email or web address] URL: [email or web address] Date: 07/14/2023 Prepared by: Temple Pacini  Exercises -  Seated Head Nods Vestibular Habituation  - 1 x daily - 6-7 x weekly - 2 sets - 5 reps - Vestibular Habituation - Seated Horizontal Head Rotation  - 1 x daily - 6-7 x weekly - 2 sets - 5 reps - Seated Scapular Retraction  - 1 x daily - 7 x weekly - 2 sets - 10 reps - Seated Upper Trapezius Stretch  - 1 x daily - 7 x weekly - 2 sets - 1 reps - 30 seconds/side hold - Walking on Treadmill  - 1 x daily - 3-5 x weekly - 1 sets - 1 reps - 5 minutes hold  - hold on to treadmill  GOALS:  Goals reviewed with patient? Yes, initiated, will continue review with completion of further assessment/testing  SHORT TERM GOALS: Target date: 08/16/2023   Patient will be independent in home exercise program to improve dizziness, balance and mobility for better functional independence with ADLs and increased QOL. Baseline: Goal status: INITIAL   LONG TERM GOALS: Target date: 09/13/2023     1.  Patient will reduce dizziness handicap inventory score to <50, for less dizziness with ADLs and increased safety with home and work tasks.  Baseline:  68 Goal status: INITIAL  2.  Patient will increase ABC scale score >80% to demonstrate better functional mobility and better confidence with  ADLs.  Baseline:  48.69% Goal status: INITIAL  3.  Patient will increase dynamic gait index score to >22/24 as to demonstrate  improved dynamic gait balance for better safety with community/home ambulation and ability to scan environment without LOB.  Baseline: 21/24 Goal status: INITIAL  4. The pt will report ability to complete cardio program at home (e.g. with using her treadmill) for at least 10 minutes 3x/week with no greater than mild dizzy symptoms to return to PLOF.  Baseline: not currently completing cardio program  Goal status: INITIAL  5. The pt will exhibit ability to complete at least 10 reps of cervical flexion>neutral cervical position without increase in dizziness symptoms from baseline for improved mobility/ability to complete ADLs.  Baseline: movement currently triggers symptom increase  Goal status: INITIAL   ASSESSMENT:  CLINICAL IMPRESSION: Pt found to have impaired cervical AROM with pain-limitations. Manual initiated and was followed by habituation and balance exercise. Pt responded well to use of manual therapy and had no dizziness today in session with habituation and balance activities. The pt will benefit from further skilled PT to improve deficits in order to increase balance, QOL, decrease fall risk and dizziness.  OBJECTIVE IMPAIRMENTS: Abnormal gait, decreased activity tolerance, decreased balance, decreased coordination, decreased mobility, difficulty walking, dizziness, improper body mechanics, postural dysfunction, and pain.   ACTIVITY LIMITATIONS: carrying, bending, standing, stairs, bed mobility, locomotion level, and ADLs generally impacted when pt with increased dizzy symptoms  PARTICIPATION LIMITATIONS: meal prep, cleaning, driving, shopping, community activity, and yard work  PERSONAL FACTORS: Age, Sex, Time since onset of injury/illness/exacerbation, and 3+ comorbidities: PMH significant for athritis, breast CA (2006 bilat breast-lobular carcinoma  in situ), headache, anxiety, hx of CVA, HLD, osteoporosis    are also affecting patient's functional outcome.   REHAB POTENTIAL: Good  CLINICAL DECISION MAKING: Evolving/moderate complexity  EVALUATION COMPLEXITY: Moderate   PLAN:  PT FREQUENCY: 1-2x/week  PT DURATION: 8 weeks  PLANNED INTERVENTIONS: 97164- PT Re-evaluation, 97110-Therapeutic exercises, 97530- Therapeutic activity, 97112- Neuromuscular re-education, 97535- Self Care, 16109- Manual therapy, [phone]- Gait training, [phone]- Canalith repositioning, Patient/Family education, Balance training, Stair training, Taping, Joint mobilization, Spinal mobilization, Vestibular training,  DME instructions, Cryotherapy, and Moist heat  PLAN FOR NEXT SESSION: [employer] conditioning program, manual, habituation   Baird Kay, PT 07/19/2023, 12:05 PM

## 2023-07-25 ENCOUNTER — Ambulatory Visit

## 2023-07-25 DIAGNOSIS — R42 Dizziness and giddiness: Secondary | ICD-10-CM

## 2023-07-25 DIAGNOSIS — M542 Cervicalgia: Secondary | ICD-10-CM

## 2023-07-25 DIAGNOSIS — M6281 Muscle weakness (generalized): Secondary | ICD-10-CM

## 2023-07-25 DIAGNOSIS — R262 Difficulty in walking, not elsewhere classified: Secondary | ICD-10-CM | POA: Diagnosis not present

## 2023-07-25 DIAGNOSIS — R2681 Unsteadiness on feet: Secondary | ICD-10-CM | POA: Diagnosis not present

## 2023-07-25 NOTE — Therapy (Signed)
 OUTPATIENT PHYSICAL THERAPY VESTIBULAR TREATMENT     Patient Name: Amanda Osborn MRN: [id number] DOB:07-04-1955, 68 y.o., female Today's Date: 07/25/2023  END OF SESSION:  PT End of Session - 07/25/23 1015     Visit Number 4    Number of Visits 17    Date for PT Re-Evaluation 09/06/23    Equipment Utilized During Treatment Gait belt    Activity Tolerance Patient tolerated treatment well    Behavior During Therapy [facility] for tasks assessed/performed             Past Medical History:  Diagnosis Date   Allergy    Anxiety    Arthritis    Breast cancer (HCC) 2006   BIL BREASTS-LOBULAR CARCINOMA IN SITU   Dizziness    Headache    History of CVA (cerebrovascular accident)    Hyperlipidemia    Osteoporosis    Stroke (HCC) 2019   NO RESIDUAL EFFECTS   Past Surgical History:  Procedure Laterality Date   AUGMENTATION MAMMAPLASTY Bilateral 2011   BREAST CYST ASPIRATION Bilateral    BREAST EXCISIONAL BIOPSY Left 2006   breast ca   BREAST LUMPECTOMY     CARDIAC CATHETERIZATION     COLONOSCOPY     COLONOSCOPY WITH PROPOFOL N/A 06/28/2016   Procedure: COLONOSCOPY WITH PROPOFOL;  Surgeon: Deveron Fly, MD;  Location: [facility] ENDOSCOPY;  Service: Endoscopy;  Laterality: N/A;   CYSTOSCOPY W/ URETERAL STENT PLACEMENT Left 09/21/2019   Procedure: CYSTOSCOPY WITH RETROGRADE PYELOGRAM/URETERAL STENT PLACEMENT;  Surgeon: Lawerence Pressman, MD;  Location: [facility] ORS;  Service: Urology;  Laterality: Left;   LYMPH NODE BIOPSY     NECK   MASTECTOMY Bilateral    TEE WITHOUT CARDIOVERSION N/A 12/30/2017   Procedure: TRANSESOPHAGEAL ECHOCARDIOGRAM (TEE);  Surgeon: Ronney Cola, MD;  Location: [facility] ORS;  Service: Cardiovascular;  Laterality: N/A;   TOTAL VAGINAL HYSTERECTOMY     URETEROSCOPY Left 09/21/2019   Procedure: URETEROSCOPY-DIAGNOSTIC;  Surgeon: Lawerence Pressman, MD;  Location: [facility] ORS;  Service: Urology;  Laterality: Left;   VAGINAL HYSTERECTOMY     Patient Active  Problem List   Diagnosis Date Noted   Pelvic pain in female 08/30/2019   Dizziness 03/21/2019   CVA (cerebral vascular accident) (HCC) 12/29/2017   Slurred speech 12/28/2017   Malignant neoplastic disease (HCC) 03/18/2015   Chondromalacia of patella 09/13/2014   Bursitis 09/13/2014   Allergic rhinitis 09/13/2014   Cancer (HCC) 09/13/2014   Cervical disc disease 09/13/2014    PCP: Clarise Crooks, MD REFERRING PROVIDER: Devora Folks, Linnell Richardson, MD   REFERRING DIAG:  Diagnosis  G43.809 (ICD-10-CM) - Vestibular migraine  R42 (ICD-10-CM) - Dizziness and giddiness    THERAPY DIAG:  No diagnosis found.  ONSET DATE: 2019  Rationale for Evaluation and Treatment: Rehabilitation  SUBJECTIVE:   SUBJECTIVE STATEMENT:  Pt has been doing her HEP. Pt having a bad symptom day. She is feeling imbalance and dizziness. She rates this as moderate. Stress is high.  Pt accompanied by: self  PERTINENT HISTORY:   FROM EVAL: Pt had a mild stroke in 2019. She has dealt with dizziness since the stroke. She reports her dizziness has worsened over last two years, balance is not good, and she has headaches. Pt has experienced vertigo (says went to ED for this 2x) with vomiting. Now her dizziness is more feeling as if she is moving and not so much spinning. She reports 2 falls in her home in the last six months. She stumbles  when she gets up and is not sure on her feet/not steady. It's always there. Pt used to take Meclizine (not currently) and reports she is also taking medication for migraine 1x/day. She rates current dizziness as 3/10. Balance is noticeably worse in the dark, difficulty with uneven surfaces. Not using an AD, pt furniture surfs. Pt has a dull HA most of the time, comes on random/instantly. Pt has ringing in both ears (this started three weeks ago); saw ENT said hearing was OK but did not have ringing in ears at the time.  Pt reports she can't hear as well recently. This is an issue for both ears  but mainly L side. No increased dizziness as passenger in a car, some dizziness in a busy store, no dizziness with watching TV, but dizzy with scrolling on a phone/looking at it for a long time (feels bad when she looks back up). No symptom increase with head tilt back, but present with tilt forward/down, dizziness with quick horizontal head turns and pt reports sometimes she can feel somewhat dizzy rolling side to side in her bed. Pt reports hx of VNG testing which was unremarkable. Pt reports she has zero energy since this all began. Pt used to use treadmill, but has not recently due to symptoms. PMH significant for athritis, breast CA (2006 bilat breast-lobular carcinoma in situ), headache, anxiety, hx of CVA, HLD, osteoporosis    PAIN:  Are you having pain?  Pt reports pain "all over" most noticably in her neck and back, says neck pain worsened in last 2-3 months, back problems ongoing for 7 years  PRECAUTIONS: Fall  RED FLAGS: None   WEIGHT BEARING RESTRICTIONS: No  FALLS: Has patient fallen in last 6 months?  Yes 2  LIVING ENVIRONMENT: deferred  PLOF: Independent  PATIENT GOALS: Any improvement will help (balance, dizzy symptoms)  OBJECTIVE:  Note: Objective measures were completed at Evaluation unless otherwise noted.  DIAGNOSTIC FINDINGS:  via chart  DG cervical spine 03/02/23: " FINDINGS:  No fracture, dislocation or subluxation. Minimal anterolisthesis C7  with respect to T1. No osteolytic or osteoblastic changes.  Prevertebral and cervical cranial soft tissues are unremarkable.   Degenerative disc disease noted with disc space narrowing and  marginal osteophytes most severely at C4-5.   IMPRESSION:  Degenerative changes. No acute osseous abnormalities. No change from  prior study    Electronically Signed    By: Sydell Eva M.D.    On: 03/02/2023 22:26  "  MR BRAIN 03/01/23:  "FINDINGS: Brain: No acute infarction, hemorrhage, hydrocephalus,  extra-axial collection or mass lesion. Mild chronic small vessel ischemia in the cerebral white matter. Age normal brain volume. Small chronic left cerebellar infarct. No abnormal enhancement. No evidence of retrocochlear lesion.   Vascular: Normal flow voids.   Skull and upper cervical spine: Normal marrow signal.   Sinuses/Orbits: Negative.   IMPRESSION: 1. No recent insult or specific explanation for symptoms. 2. Small chronic left cerebellar infarct present since at least 2019. Mild chronic small vessel ischemia.     Electronically Signed   By: Ronnette Coke M.D.   On: 03/01/2023 07:29"   CT ANGIO HEAD NECK 1024/24: " IMPRESSION: 1. No intracranial large vessel occlusion or significant stenosis. 2. No hemodynamically significant stenosis in the neck. 3. No evidence of vertebral or carotid artery dissection.     Electronically Signed   By: Zoila Hines M.D.   On: 02/03/2023 18:37"  CT HEAD 02/03/23: " FINDINGS: Brain: No evidence of  acute infarction, hemorrhage, hydrocephalus, extra-axial collection or mass lesion/mass effect. Small remote cerebellar infarct.   Vascular: No hyperdense vessel.   Skull: Normal. Negative for fracture or focal lesion.   Sinuses/Orbits: Clear sinuses.  No acute orbital findings.   Other: No mastoid effusions.   IMPRESSION: No evidence of acute intracranial abnormality.     Electronically Signed   By: Feliberto Harts M.D.   On: 02/03/2023 15:32"  COGNITION: Overall cognitive status: Within functional limits for tasks assessed   SENSATION: Not assessed   POSTURE:  Elevated shoulders L more than R   Cervical ROM:   07/19/23 Cervical AROM measurements: Extension 55 deg, some pain  Flexion: 50 deg Lateral flexion 40 deg bilateral and pain-limited  Rotation 73 R, 63 L - some pain limitation   STRENGTH:   LOWER EXTREMITY MMT:  deferred  BED MOBILITY:  Some impairment due to dizziness (such as  rolling)  TRANSFERS: Assistive device utilized: None  Sit to stand: Complete Independence Stand to sit: Complete Independence Chair to chair: Complete Independence *pt did report impairment with sit>stand with increased dizziness symptoms    GAIT: Gait pattern:  increased variability in BOS/unsteadiness with scanning Distance walked: clinic distances Assistive device utilized: None Level of assistance: SBA and CGA  FUNCTIONAL TESTS:  Dynamic Gait Index: 21/24  PATIENT SURVEYS:  ABC scale 48.69 DHI 68  VESTIBULAR ASSESSMENT:  OCULOMOTOR EXAM:  Ocular Alignment: normal  Ocular ROM: No Limitations  Spontaneous Nystagmus: absent  Gaze-Induced Nystagmus: absent  Smooth Pursuits: saccades - mild, likely WNL for age  Saccades: hypometric/undershoots with corrective saccade   Convergence/Divergence: sees double approx 4" from face     VESTIBULAR - OCULAR REFLEX: 07/14/2023  Slow VOR: WNL  VOR Cancellation: WNL   Head-Impulse Test:  WNL bilat       POSITIONAL TESTING: not assessed, pt reports hx positional testing with no findings             TREATMENT DATE: 07/25/23   Manual: heat donned bilat shoulders, nothing currently applied to skin to prevent safe use of heat, pt reports heat feels good/helpful  -STM to bilat UT, cervical paraspinals, and posterior shoulder mm, addition of suboccip release and TrP release. Prominent TrPs in L UT, R occipitals. PT brings pt through UT stretch, shoulder depression and cervical side bend/rotation stretch each side for long duration while providing manual. -Chin to chest stretch x 30 sec   TA: Instruction in [employer] machines for strength/conditioning program Lvl 1-4 for the following Knee ext 10x bilat Leg curl/hamstring curl 10x bilat Leg press 15x bilat Heel press 10x bilat  NMR: Habituation: Head nods seated 5x - felt tight in cervical mm and some dizziness  Horizontal head turns seated 5x  - tightness, no dizziness   Tandem gait 4x10 meters - challenging      PATIENT EDUCATION: Education details: exercise technique, [employer] machines Person educated: Patient Education method: Explanation Education comprehension: verbalized understanding  HOME EXERCISE PROGRAM:  Access Code: [email or web address] URL: [email or web address] Date: 07/14/2023 Prepared by: Temple Pacini  Exercises - Seated Head Nods Vestibular Habituation  - 1 x daily - 6-7 x weekly - 2 sets - 5 reps - Vestibular Habituation - Seated Horizontal Head Rotation  - 1 x daily - 6-7 x weekly - 2 sets - 5 reps - Seated Scapular Retraction  - 1 x daily - 7 x weekly - 2 sets - 10 reps - Seated Upper Trapezius Stretch  - 1 x daily - 7 x weekly -  2 sets - 1 reps - 30 seconds/side hold - Walking on Treadmill  - 1 x daily - 3-5 x weekly - 1 sets - 1 reps - 5 minutes hold  - hold on to treadmill  GOALS:  Goals reviewed with patient? Yes, initiated, will continue review with completion of further assessment/testing  SHORT TERM GOALS: Target date: 08/22/2023   Patient will be independent in home exercise program to improve dizziness, balance and mobility for better functional independence with ADLs and increased QOL. Baseline: Goal status: INITIAL   LONG TERM GOALS: Target date: 09/19/2023     1.  Patient will reduce dizziness handicap inventory score to <50, for less dizziness with ADLs and increased safety with home and work tasks.  Baseline:  68 Goal status: INITIAL  2.  Patient will increase ABC scale score >80% to demonstrate better functional mobility and better confidence with ADLs.  Baseline:  48.69% Goal status: INITIAL  3.  Patient will increase dynamic gait index score to >22/24 as to demonstrate  improved dynamic gait balance for better safety with community/home ambulation and ability to scan environment without LOB.  Baseline: 21/24 Goal status: INITIAL  4. The pt will report ability to complete cardio program at home  (e.g. with using her treadmill) for at least 10 minutes 3x/week with no greater than mild dizzy symptoms to return to PLOF.  Baseline: not currently completing cardio program  Goal status: INITIAL  5. The pt will exhibit ability to complete at least 10 reps of cervical flexion>neutral cervical position without increase in dizziness symptoms from baseline for improved mobility/ability to complete ADLs.  Baseline: movement currently triggers symptom increase  Goal status: INITIAL   ASSESSMENT:  CLINICAL IMPRESSION: Pt presents with higher symptoms today. She still is noted to have TrPs bilaterally with manual therapy, but continues to respond well to this intervention. PT and pt reviewed [employer] machines to initiated conditioning program. Plan to continue conditioning next visit. The pt will benefit from further skilled PT to improve deficits in order to increase balance, QOL, decrease fall risk and dizziness.  OBJECTIVE IMPAIRMENTS: Abnormal gait, decreased activity tolerance, decreased balance, decreased coordination, decreased mobility, difficulty walking, dizziness, improper body mechanics, postural dysfunction, and pain.   ACTIVITY LIMITATIONS: carrying, bending, standing, stairs, bed mobility, locomotion level, and ADLs generally impacted when pt with increased dizzy symptoms  PARTICIPATION LIMITATIONS: meal prep, cleaning, driving, shopping, community activity, and yard work  PERSONAL FACTORS: Age, Sex, Time since onset of injury/illness/exacerbation, and 3+ comorbidities: PMH significant for athritis, breast CA (2006 bilat breast-lobular carcinoma in situ), headache, anxiety, hx of CVA, HLD, osteoporosis    are also affecting patient's functional outcome.   REHAB POTENTIAL: Good  CLINICAL DECISION MAKING: Evolving/moderate complexity  EVALUATION COMPLEXITY: Moderate   PLAN:  PT FREQUENCY: 1-2x/week  PT DURATION: 8 weeks  PLANNED INTERVENTIONS: 97164- PT Re-evaluation,  97110-Therapeutic exercises, 97530- Therapeutic activity, 97112- Neuromuscular re-education, 97535- Self Care, 91478- Manual therapy, [phone]- Gait training, [phone]- Canalith repositioning, Patient/Family education, Balance training, Stair training, Taping, Joint mobilization, Spinal mobilization, Vestibular training, DME instructions, Cryotherapy, and Moist heat  PLAN FOR NEXT SESSION:strength/conditioning program, manual, habituation   Samie Crews, PT 07/25/2023, 10:15 AM

## 2023-07-27 ENCOUNTER — Ambulatory Visit

## 2023-07-27 DIAGNOSIS — R42 Dizziness and giddiness: Secondary | ICD-10-CM | POA: Diagnosis not present

## 2023-07-27 DIAGNOSIS — M542 Cervicalgia: Secondary | ICD-10-CM

## 2023-07-27 DIAGNOSIS — M6281 Muscle weakness (generalized): Secondary | ICD-10-CM

## 2023-07-27 DIAGNOSIS — R262 Difficulty in walking, not elsewhere classified: Secondary | ICD-10-CM | POA: Diagnosis not present

## 2023-07-27 DIAGNOSIS — R2681 Unsteadiness on feet: Secondary | ICD-10-CM | POA: Diagnosis not present

## 2023-07-27 NOTE — Therapy (Signed)
 OUTPATIENT PHYSICAL THERAPY VESTIBULAR TREATMENT     Patient Name: Amanda Osborn MRN: [id number] DOB:1956/02/03, 68 y.o., female Today's Date: 07/27/2023  END OF SESSION:  PT End of Session - 07/27/23 0930     Visit Number 5    Number of Visits 17    Date for PT Re-Evaluation 09/06/23    PT Start Time 0933    PT Stop Time 1015    PT Time Calculation (min) 42 min    Equipment Utilized During Treatment Gait belt    Activity Tolerance Patient tolerated treatment well    Behavior During Therapy WFL for tasks assessed/performed             Past Medical History:  Diagnosis Date   Allergy    Anxiety    Arthritis    Breast cancer (HCC) 2006   BIL BREASTS-LOBULAR CARCINOMA IN SITU   Dizziness    Headache    History of CVA (cerebrovascular accident)    Hyperlipidemia    Osteoporosis    Stroke (HCC) 2019   NO RESIDUAL EFFECTS   Past Surgical History:  Procedure Laterality Date   AUGMENTATION MAMMAPLASTY Bilateral 2011   BREAST CYST ASPIRATION Bilateral    BREAST EXCISIONAL BIOPSY Left 2006   breast ca   BREAST LUMPECTOMY     CARDIAC CATHETERIZATION     COLONOSCOPY     COLONOSCOPY WITH PROPOFOL N/A 06/28/2016   Procedure: COLONOSCOPY WITH PROPOFOL;  Surgeon: Deveron Fly, MD;  Location: [facility] ENDOSCOPY;  Service: Endoscopy;  Laterality: N/A;   CYSTOSCOPY W/ URETERAL STENT PLACEMENT Left 09/21/2019   Procedure: CYSTOSCOPY WITH RETROGRADE PYELOGRAM/URETERAL STENT PLACEMENT;  Surgeon: Lawerence Pressman, MD;  Location: [facility] ORS;  Service: Urology;  Laterality: Left;   LYMPH NODE BIOPSY     NECK   MASTECTOMY Bilateral    TEE WITHOUT CARDIOVERSION N/A 12/30/2017   Procedure: TRANSESOPHAGEAL ECHOCARDIOGRAM (TEE);  Surgeon: Ronney Cola, MD;  Location: [facility] ORS;  Service: Cardiovascular;  Laterality: N/A;   TOTAL VAGINAL HYSTERECTOMY     URETEROSCOPY Left 09/21/2019   Procedure: URETEROSCOPY-DIAGNOSTIC;  Surgeon: Lawerence Pressman, MD;  Location: [facility] ORS;   Service: Urology;  Laterality: Left;   VAGINAL HYSTERECTOMY     Patient Active Problem List   Diagnosis Date Noted   Pelvic pain in female 08/30/2019   Dizziness 03/21/2019   CVA (cerebral vascular accident) (HCC) 12/29/2017   Slurred speech 12/28/2017   Malignant neoplastic disease (HCC) 03/18/2015   Chondromalacia of patella 09/13/2014   Bursitis 09/13/2014   Allergic rhinitis 09/13/2014   Cancer (HCC) 09/13/2014   Cervical disc disease 09/13/2014    PCP: Clarise Crooks, MD REFERRING PROVIDER: Devora Folks, Linnell Richardson, MD   REFERRING DIAG:  Diagnosis  G43.809 (ICD-10-CM) - Vestibular migraine  R42 (ICD-10-CM) - Dizziness and giddiness    THERAPY DIAG:  Dizziness and giddiness  Cervicalgia  Unsteadiness on feet  Muscle weakness (generalized)  ONSET DATE: 2019  Rationale for Evaluation and Treatment: Rehabilitation  SUBJECTIVE:   SUBJECTIVE STATEMENT:  Pt feels her balance is a bit worse today and also felt worse yesterday. She doesn't feel stable when ambulating through her house.  Pt reports she walked almost 2 miles on treadmill with UE support yesterday.  She says she was OK on the treadmill.  Pt accompanied by: self  PERTINENT HISTORY:   FROM EVAL: Pt had a mild stroke in 2019. She has dealt with dizziness since the stroke. She reports her dizziness has worsened over last  two years, balance is not good, and she has headaches. Pt has experienced vertigo (says went to ED for this 2x) with vomiting. Now her dizziness is more feeling as if she is moving and not so much spinning. She reports 2 falls in her home in the last six months. She stumbles when she gets up and is not sure on her feet/not steady. It's always there. Pt used to take Meclizine (not currently) and reports she is also taking medication for migraine 1x/day. She rates current dizziness as 3/10. Balance is noticeably worse in the dark, difficulty with uneven surfaces. Not using an AD, pt furniture surfs. Pt  has a dull HA most of the time, comes on random/instantly. Pt has ringing in both ears (this started three weeks ago); saw ENT said hearing was OK but did not have ringing in ears at the time.  Pt reports she can't hear as well recently. This is an issue for both ears but mainly L side. No increased dizziness as passenger in a car, some dizziness in a busy store, no dizziness with watching TV, but dizzy with scrolling on a phone/looking at it for a long time (feels bad when she looks back up). No symptom increase with head tilt back, but present with tilt forward/down, dizziness with quick horizontal head turns and pt reports sometimes she can feel somewhat dizzy rolling side to side in her bed. Pt reports hx of VNG testing which was unremarkable. Pt reports she has zero energy since this all began. Pt used to use treadmill, but has not recently due to symptoms. PMH significant for athritis, breast CA (2006 bilat breast-lobular carcinoma in situ), headache, anxiety, hx of CVA, HLD, osteoporosis    PAIN:  Are you having pain?  Pt reports pain "all over" most noticably in her neck and back, says neck pain worsened in last 2-3 months, back problems ongoing for 7 years  PRECAUTIONS: Fall  RED FLAGS: None   WEIGHT BEARING RESTRICTIONS: No  FALLS: Has patient fallen in last 6 months?  Yes 2  LIVING ENVIRONMENT: deferred  PLOF: Independent  PATIENT GOALS: Any improvement will help (balance, dizzy symptoms)  OBJECTIVE:  Note: Objective measures were completed at Evaluation unless otherwise noted.  DIAGNOSTIC FINDINGS:  via chart  DG cervical spine 03/02/23: " FINDINGS:  No fracture, dislocation or subluxation. Minimal anterolisthesis C7  with respect to T1. No osteolytic or osteoblastic changes.  Prevertebral and cervical cranial soft tissues are unremarkable.   Degenerative disc disease noted with disc space narrowing and  marginal osteophytes most severely at C4-5.   IMPRESSION:   Degenerative changes. No acute osseous abnormalities. No change from  prior study    Electronically Signed    By: Layla Maw M.D.    On: 03/02/2023 22:26  "  MR BRAIN 03/01/23:  "FINDINGS: Brain: No acute infarction, hemorrhage, hydrocephalus, extra-axial collection or mass lesion. Mild chronic small vessel ischemia in the cerebral white matter. Age normal brain volume. Small chronic left cerebellar infarct. No abnormal enhancement. No evidence of retrocochlear lesion.   Vascular: Normal flow voids.   Skull and upper cervical spine: Normal marrow signal.   Sinuses/Orbits: Negative.   IMPRESSION: 1. No recent insult or specific explanation for symptoms. 2. Small chronic left cerebellar infarct present since at least 2019. Mild chronic small vessel ischemia.     Electronically Signed   By: Tiburcio Pea M.D.   On: 03/01/2023 07:29"   CT ANGIO HEAD NECK 1024/24: " IMPRESSION: 1.  No intracranial large vessel occlusion or significant stenosis. 2. No hemodynamically significant stenosis in the neck. 3. No evidence of vertebral or carotid artery dissection.     Electronically Signed   By: Zoila Hines M.D.   On: 02/03/2023 18:37"  CT HEAD 02/03/23: " FINDINGS: Brain: No evidence of acute infarction, hemorrhage, hydrocephalus, extra-axial collection or mass lesion/mass effect. Small remote cerebellar infarct.   Vascular: No hyperdense vessel.   Skull: Normal. Negative for fracture or focal lesion.   Sinuses/Orbits: Clear sinuses.  No acute orbital findings.   Other: No mastoid effusions.   IMPRESSION: No evidence of acute intracranial abnormality.     Electronically Signed   By: Stevenson Elbe M.D.   On: 02/03/2023 15:32"  COGNITION: Overall cognitive status: Within functional limits for tasks assessed   SENSATION: Not assessed   POSTURE:  Elevated shoulders L more than R   Cervical ROM:   07/19/23 Cervical AROM  measurements: Extension 55 deg, some pain  Flexion: 50 deg Lateral flexion 40 deg bilateral and pain-limited  Rotation 73 R, 63 L - some pain limitation   STRENGTH:   LOWER EXTREMITY MMT:  deferred  BED MOBILITY:  Some impairment due to dizziness (such as rolling)  TRANSFERS: Assistive device utilized: None  Sit to stand: Complete Independence Stand to sit: Complete Independence Chair to chair: Complete Independence *pt did report impairment with sit>stand with increased dizziness symptoms    GAIT: Gait pattern:  increased variability in BOS/unsteadiness with scanning Distance walked: clinic distances Assistive device utilized: None Level of assistance: SBA and CGA  FUNCTIONAL TESTS:  Dynamic Gait Index: 21/24  PATIENT SURVEYS:  ABC scale 48.69 DHI 68  VESTIBULAR ASSESSMENT:  OCULOMOTOR EXAM:  Ocular Alignment: normal  Ocular ROM: No Limitations  Spontaneous Nystagmus: absent  Gaze-Induced Nystagmus: absent  Smooth Pursuits: saccades - mild, likely WNL for age  Saccades: hypometric/undershoots with corrective saccade   Convergence/Divergence: sees double approx 4" from face     VESTIBULAR - OCULAR REFLEX: 07/14/2023  Slow VOR: WNL  VOR Cancellation: WNL   Head-Impulse Test:  WNL bilat       POSITIONAL TESTING: not assessed, pt reports hx positional testing with no findings             TREATMENT DATE: 07/27/23   Manual:  -STM to bilat UT, cervical paraspinals, and posterior shoulder mm, addition of suboccip release and TrP release. Pt TTP throughout majority of mm bilat. PT brings pt through UT stretch and cervical side bend/rotation stretch for long duration while providing manual.  TE: Seated cable machine rows 3x10 @ 7.5# bilat UE Standing shoulder shoulder shrug holding 5# dumbbell with shoulder depression and UT stretch 2x10 each side  STS 2x15  Seated scapular squeezes 10x bilat  NMR: 4 rounds over 10 m for each of the  following Semi-tandem Slow walking march  Obstacle course cone taps x 4 rounds x 6 cones  Habituation: Head nods seated 5x -  makes pt a little bit dizzy, feels a stretch in her neck Horizontal head turns seated 5x - similar to the above intervention regarding symptoms      PATIENT EDUCATION: Education details: exercise technique Person educated: Patient Education method: Explanation Education comprehension: verbalized understanding  HOME EXERCISE PROGRAM:  Access Code: [email or web address] URL: [email or web address] Date: 07/14/2023 Prepared by: Aminta Kales  Exercises - Seated Head Nods Vestibular Habituation  - 1 x daily - 6-7 x weekly - 2 sets - 5 reps - Vestibular Habituation - Seated Horizontal  Head Rotation  - 1 x daily - 6-7 x weekly - 2 sets - 5 reps - Seated Scapular Retraction  - 1 x daily - 7 x weekly - 2 sets - 10 reps - Seated Upper Trapezius Stretch  - 1 x daily - 7 x weekly - 2 sets - 1 reps - 30 seconds/side hold - Walking on Treadmill  - 1 x daily - 3-5 x weekly - 1 sets - 1 reps - 5 minutes hold  - hold on to treadmill  GOALS:  Goals reviewed with patient? Yes, initiated, will continue review with completion of further assessment/testing  SHORT TERM GOALS: Target date: 08/24/2023   Patient will be independent in home exercise program to improve dizziness, balance and mobility for better functional independence with ADLs and increased QOL. Baseline: Goal status: INITIAL   LONG TERM GOALS: Target date: 09/21/2023     1.  Patient will reduce dizziness handicap inventory score to <50, for less dizziness with ADLs and increased safety with home and work tasks.  Baseline:  68 Goal status: INITIAL  2.  Patient will increase ABC scale score >80% to demonstrate better functional mobility and better confidence with ADLs.  Baseline:  48.69% Goal status: INITIAL  3.  Patient will increase dynamic gait index score to >22/24 as to demonstrate  improved  dynamic gait balance for better safety with community/home ambulation and ability to scan environment without LOB.  Baseline: 21/24 Goal status: INITIAL  4. The pt will report ability to complete cardio program at home (e.g. with using her treadmill) for at least 10 minutes 3x/week with no greater than mild dizzy symptoms to return to PLOF.  Baseline: not currently completing cardio program  Goal status: INITIAL  5. The pt will exhibit ability to complete at least 10 reps of cervical flexion>neutral cervical position without increase in dizziness symptoms from baseline for improved mobility/ability to complete ADLs.  Baseline: movement currently triggers symptom increase  Goal status: INITIAL   ASSESSMENT:  CLINICAL IMPRESSION: Pt continues to exhibit high symptoms today and is TTP throughout majority of mm PT provided manual to. Pt completed conditioning, balance and habituation interventions following manual. While still symptomatic with habituation interventions, she did report feeling somewhat better at end of session. The pt will benefit from further skilled PT to improve deficits in order to increase balance, QOL, decrease fall risk and dizziness.  OBJECTIVE IMPAIRMENTS: Abnormal gait, decreased activity tolerance, decreased balance, decreased coordination, decreased mobility, difficulty walking, dizziness, improper body mechanics, postural dysfunction, and pain.   ACTIVITY LIMITATIONS: carrying, bending, standing, stairs, bed mobility, locomotion level, and ADLs generally impacted when pt with increased dizzy symptoms  PARTICIPATION LIMITATIONS: meal prep, cleaning, driving, shopping, community activity, and yard work  PERSONAL FACTORS: Age, Sex, Time since onset of injury/illness/exacerbation, and 3+ comorbidities: PMH significant for athritis, breast CA (2006 bilat breast-lobular carcinoma in situ), headache, anxiety, hx of CVA, HLD, osteoporosis    are also affecting patient's  functional outcome.   REHAB POTENTIAL: Good  CLINICAL DECISION MAKING: Evolving/moderate complexity  EVALUATION COMPLEXITY: Moderate   PLAN:  PT FREQUENCY: 1-2x/week  PT DURATION: 8 weeks  PLANNED INTERVENTIONS: 97164- PT Re-evaluation, 97110-Therapeutic exercises, 97530- Therapeutic activity, 97112- Neuromuscular re-education, 97535- Self Care, 21308- Manual therapy, [phone]- Gait training, [phone]- Canalith repositioning, Patient/Family education, Balance training, Stair training, Taping, Joint mobilization, Spinal mobilization, Vestibular training, DME instructions, Cryotherapy, and Moist heat  PLAN FOR NEXT SESSION:strength/conditioning program, manual, habituation   Samie Crews, PT 07/27/2023, 1:28  PM

## 2023-08-01 ENCOUNTER — Ambulatory Visit

## 2023-08-01 DIAGNOSIS — M6281 Muscle weakness (generalized): Secondary | ICD-10-CM | POA: Diagnosis not present

## 2023-08-01 DIAGNOSIS — R262 Difficulty in walking, not elsewhere classified: Secondary | ICD-10-CM | POA: Diagnosis not present

## 2023-08-01 DIAGNOSIS — R42 Dizziness and giddiness: Secondary | ICD-10-CM

## 2023-08-01 DIAGNOSIS — M542 Cervicalgia: Secondary | ICD-10-CM | POA: Diagnosis not present

## 2023-08-01 DIAGNOSIS — R2681 Unsteadiness on feet: Secondary | ICD-10-CM

## 2023-08-01 DIAGNOSIS — F411 Generalized anxiety disorder: Secondary | ICD-10-CM | POA: Diagnosis not present

## 2023-08-01 DIAGNOSIS — F5105 Insomnia due to other mental disorder: Secondary | ICD-10-CM | POA: Diagnosis not present

## 2023-08-01 NOTE — Therapy (Signed)
 OUTPATIENT PHYSICAL THERAPY VESTIBULAR TREATMENT     Patient Name: Amanda Osborn MRN: [id number] DOB:Sep 03, 1955, 68 y.o., female Today's Date: 08/01/2023  END OF SESSION:  PT End of Session - 08/01/23 0932     Visit Number 6    Number of Visits 17    Date for PT Re-Evaluation 09/06/23    PT Start Time 0933    PT Stop Time 1012    PT Time Calculation (min) 39 min    Equipment Utilized During Treatment Gait belt    Activity Tolerance Patient tolerated treatment well    Behavior During Therapy WFL for tasks assessed/performed             Past Medical History:  Diagnosis Date   Allergy    Anxiety    Arthritis    Breast cancer (HCC) 2006   BIL BREASTS-LOBULAR CARCINOMA IN SITU   Dizziness    Headache    History of CVA (cerebrovascular accident)    Hyperlipidemia    Osteoporosis    Stroke (HCC) 2019   NO RESIDUAL EFFECTS   Past Surgical History:  Procedure Laterality Date   AUGMENTATION MAMMAPLASTY Bilateral 2011   BREAST CYST ASPIRATION Bilateral    BREAST EXCISIONAL BIOPSY Left 2006   breast ca   BREAST LUMPECTOMY     CARDIAC CATHETERIZATION     COLONOSCOPY     COLONOSCOPY WITH PROPOFOL  N/A 06/28/2016   Procedure: COLONOSCOPY WITH PROPOFOL ;  Surgeon: Deveron Fly, MD;  Location: [facility] ENDOSCOPY;  Service: Endoscopy;  Laterality: N/A;   CYSTOSCOPY W/ URETERAL STENT PLACEMENT Left 09/21/2019   Procedure: CYSTOSCOPY WITH RETROGRADE PYELOGRAM/URETERAL STENT PLACEMENT;  Surgeon: Lawerence Pressman, MD;  Location: [facility] ORS;  Service: Urology;  Laterality: Left;   LYMPH NODE BIOPSY     NECK   MASTECTOMY Bilateral    TEE WITHOUT CARDIOVERSION N/A 12/30/2017   Procedure: TRANSESOPHAGEAL ECHOCARDIOGRAM (TEE);  Surgeon: Ronney Cola, MD;  Location: [facility] ORS;  Service: Cardiovascular;  Laterality: N/A;   TOTAL VAGINAL HYSTERECTOMY     URETEROSCOPY Left 09/21/2019   Procedure: URETEROSCOPY-DIAGNOSTIC;  Surgeon: Lawerence Pressman, MD;  Location: [facility] ORS;   Service: Urology;  Laterality: Left;   VAGINAL HYSTERECTOMY     Patient Active Problem List   Diagnosis Date Noted   Pelvic pain in female 08/30/2019   Dizziness 03/21/2019   CVA (cerebral vascular accident) (HCC) 12/29/2017   Slurred speech 12/28/2017   Malignant neoplastic disease (HCC) 03/18/2015   Chondromalacia of patella 09/13/2014   Bursitis 09/13/2014   Allergic rhinitis 09/13/2014   Cancer (HCC) 09/13/2014   Cervical disc disease 09/13/2014    PCP: Clarise Crooks, MD REFERRING PROVIDER: Devora Folks, Linnell Richardson, MD   REFERRING DIAG:  Diagnosis  G43.809 (ICD-10-CM) - Vestibular migraine  R42 (ICD-10-CM) - Dizziness and giddiness    THERAPY DIAG:  Muscle weakness (generalized)  Dizziness and giddiness  Unsteadiness on feet  ONSET DATE: 2019  Rationale for Evaluation and Treatment: Rehabilitation  SUBJECTIVE:   SUBJECTIVE STATEMENT: Pt reports dizzy symptoms are still about a 5/10 currently.. She has been walking on her treadmill a lot at home. She reports she is not really having headaches, but they still can come on at random at times. She has not had as much neck pain.  Pt accompanied by: self  PERTINENT HISTORY:   FROM EVAL: Pt had a mild stroke in 2019. She has dealt with dizziness since the stroke. She reports her dizziness has worsened over last two years,  balance is not good, and she has headaches. Pt has experienced vertigo (says went to ED for this 2x) with vomiting. Now her dizziness is more feeling as if she is moving and not so much spinning. She reports 2 falls in her home in the last six months. She stumbles when she gets up and is not sure on her feet/not steady. It's always there. Pt used to take Meclizine  (not currently) and reports she is also taking medication for migraine 1x/day. She rates current dizziness as 3/10. Balance is noticeably worse in the dark, difficulty with uneven surfaces. Not using an AD, pt furniture surfs. Pt has a dull HA most of  the time, comes on random/instantly. Pt has ringing in both ears (this started three weeks ago); saw ENT said hearing was OK but did not have ringing in ears at the time.  Pt reports she can't hear as well recently. This is an issue for both ears but mainly L side. No increased dizziness as passenger in a car, some dizziness in a busy store, no dizziness with watching TV, but dizzy with scrolling on a phone/looking at it for a long time (feels bad when she looks back up). No symptom increase with head tilt back, but present with tilt forward/down, dizziness with quick horizontal head turns and pt reports sometimes she can feel somewhat dizzy rolling side to side in her bed. Pt reports hx of VNG testing which was unremarkable. Pt reports she has zero energy since this all began. Pt used to use treadmill, but has not recently due to symptoms. PMH significant for athritis, breast CA (2006 bilat breast-lobular carcinoma in situ), headache, anxiety, hx of CVA, HLD, osteoporosis    PAIN:  Are you having pain?  Pt reports pain "all over" most noticably in her neck and back, says neck pain worsened in last 2-3 months, back problems ongoing for 7 years  PRECAUTIONS: Fall  RED FLAGS: None   WEIGHT BEARING RESTRICTIONS: No  FALLS: Has patient fallen in last 6 months?  Yes 2  LIVING ENVIRONMENT: deferred  PLOF: Independent  PATIENT GOALS: Any improvement will help (balance, dizzy symptoms)  OBJECTIVE:  Note: Objective measures were completed at Evaluation unless otherwise noted.  DIAGNOSTIC FINDINGS:  via chart  DG cervical spine 03/02/23: " FINDINGS:  No fracture, dislocation or subluxation. Minimal anterolisthesis C7  with respect to T1. No osteolytic or osteoblastic changes.  Prevertebral and cervical cranial soft tissues are unremarkable.   Degenerative disc disease noted with disc space narrowing and  marginal osteophytes most severely at C4-5.   IMPRESSION:  Degenerative changes. No  acute osseous abnormalities. No change from  prior study    Electronically Signed    By: Sydell Eva M.D.    On: 03/02/2023 22:26  "  MR BRAIN 03/01/23:  "FINDINGS: Brain: No acute infarction, hemorrhage, hydrocephalus, extra-axial collection or mass lesion. Mild chronic small vessel ischemia in the cerebral white matter. Age normal brain volume. Small chronic left cerebellar infarct. No abnormal enhancement. No evidence of retrocochlear lesion.   Vascular: Normal flow voids.   Skull and upper cervical spine: Normal marrow signal.   Sinuses/Orbits: Negative.   IMPRESSION: 1. No recent insult or specific explanation for symptoms. 2. Small chronic left cerebellar infarct present since at least 2019. Mild chronic small vessel ischemia.     Electronically Signed   By: Ronnette Coke M.D.   On: 03/01/2023 07:29"   CT ANGIO HEAD NECK 1024/24: " IMPRESSION: 1. No intracranial  large vessel occlusion or significant stenosis. 2. No hemodynamically significant stenosis in the neck. 3. No evidence of vertebral or carotid artery dissection.     Electronically Signed   By: Zoila Hines M.D.   On: 02/03/2023 18:37"  CT HEAD 02/03/23: " FINDINGS: Brain: No evidence of acute infarction, hemorrhage, hydrocephalus, extra-axial collection or mass lesion/mass effect. Small remote cerebellar infarct.   Vascular: No hyperdense vessel.   Skull: Normal. Negative for fracture or focal lesion.   Sinuses/Orbits: Clear sinuses.  No acute orbital findings.   Other: No mastoid effusions.   IMPRESSION: No evidence of acute intracranial abnormality.     Electronically Signed   By: Stevenson Elbe M.D.   On: 02/03/2023 15:32"  COGNITION: Overall cognitive status: Within functional limits for tasks assessed   SENSATION: Not assessed   POSTURE:  Elevated shoulders L more than R   Cervical ROM:   07/19/23 Cervical AROM measurements: Extension 55 deg, some pain   Flexion: 50 deg Lateral flexion 40 deg bilateral and pain-limited  Rotation 73 R, 63 L - some pain limitation   STRENGTH:   LOWER EXTREMITY MMT:  deferred  BED MOBILITY:  Some impairment due to dizziness (such as rolling)  TRANSFERS: Assistive device utilized: None  Sit to stand: Complete Independence Stand to sit: Complete Independence Chair to chair: Complete Independence *pt did report impairment with sit>stand with increased dizziness symptoms    GAIT: Gait pattern:  increased variability in BOS/unsteadiness with scanning Distance walked: clinic distances Assistive device utilized: None Level of assistance: SBA and CGA  FUNCTIONAL TESTS:  Dynamic Gait Index: 21/24  PATIENT SURVEYS:  ABC scale 48.69 DHI 68  VESTIBULAR ASSESSMENT:  OCULOMOTOR EXAM:  Ocular Alignment: normal  Ocular ROM: No Limitations  Spontaneous Nystagmus: absent  Gaze-Induced Nystagmus: absent  Smooth Pursuits: saccades - mild, likely WNL for age  Saccades: hypometric/undershoots with corrective saccade   Convergence/Divergence: sees double approx 4" from face     VESTIBULAR - OCULAR REFLEX: 07/14/2023  Slow VOR: WNL  VOR Cancellation: WNL   Head-Impulse Test:  WNL bilat       POSITIONAL TESTING: not assessed, pt reports hx positional testing with no findings             TREATMENT DATE: 08/01/23  NMR: the following interventions were performed to promote habituation and conditioning to activities   Gait outside on uneven surfaces, with turning around obstacles, with vertical and horizontal and diagonal head turns, addition of speed walking as component, up/down inclines x several minutes Reports decreased dizziness to 4/10 following intervention  Standing cable machine rows: completed with BUE 7.5# 15x  12.5# 10x 17.5# 8x  Standing cable serratus punch: 7.5# 10x each UE   Standing shoulder shrug holding 5# dumbbell with shoulder depression and UT stretch x10 each side    STS 15x, 18x holding 5# weights each UE  Standing hip abd 2x15 each LE Standing hip ext 10x each LE Standing heel raises 20x bilat  Seated LAQ with 3# aw each LE 2x10 each LE Seated March 2x10 each LE        PATIENT EDUCATION: Education details: exercise technique Person educated: Patient Education method: Explanation Education comprehension: verbalized understanding  HOME EXERCISE PROGRAM:  Access Code: [email or web address] URL: [email or web address] Date: 07/14/2023 Prepared by: Aminta Kales  Exercises - Seated Head Nods Vestibular Habituation  - 1 x daily - 6-7 x weekly - 2 sets - 5 reps - Vestibular Habituation - Seated Horizontal Head Rotation  - 1  x daily - 6-7 x weekly - 2 sets - 5 reps - Seated Scapular Retraction  - 1 x daily - 7 x weekly - 2 sets - 10 reps - Seated Upper Trapezius Stretch  - 1 x daily - 7 x weekly - 2 sets - 1 reps - 30 seconds/side hold - Walking on Treadmill  - 1 x daily - 3-5 x weekly - 1 sets - 1 reps - 5 minutes hold  - hold on to treadmill  GOALS:  Goals reviewed with patient? Yes, initiated, will continue review with completion of further assessment/testing  SHORT TERM GOALS: Target date: 08/29/2023   Patient will be independent in home exercise program to improve dizziness, balance and mobility for better functional independence with ADLs and increased QOL. Baseline: Goal status: INITIAL   LONG TERM GOALS: Target date: 09/26/2023     1.  Patient will reduce dizziness handicap inventory score to <50, for less dizziness with ADLs and increased safety with home and work tasks.  Baseline:  68 Goal status: INITIAL  2.  Patient will increase ABC scale score >80% to demonstrate better functional mobility and better confidence with ADLs.  Baseline:  48.69% Goal status: INITIAL  3.  Patient will increase dynamic gait index score to >22/24 as to demonstrate  improved dynamic gait balance for better safety with community/home ambulation  and ability to scan environment without LOB.  Baseline: 21/24 Goal status: INITIAL  4. The pt will report ability to complete cardio program at home (e.g. with using her treadmill) for at least 10 minutes 3x/week with no greater than mild dizzy symptoms to return to PLOF.  Baseline: not currently completing cardio program  Goal status: INITIAL  5. The pt will exhibit ability to complete at least 10 reps of cervical flexion>neutral cervical position without increase in dizziness symptoms from baseline for improved mobility/ability to complete ADLs.  Baseline: movement currently triggers symptom increase  Goal status: INITIAL   ASSESSMENT:  CLINICAL IMPRESSION: Pt continues to have moderate levels of dizziness at baseline. She did not improvement in neck pain. Pt also with within-session improvement in dizziness following NMR intervention completed with gait outside over different surfaces and with different challenges. The pt will benefit from further skilled PT to improve deficits in order to increase balance, QOL, decrease fall risk and dizziness.  OBJECTIVE IMPAIRMENTS: Abnormal gait, decreased activity tolerance, decreased balance, decreased coordination, decreased mobility, difficulty walking, dizziness, improper body mechanics, postural dysfunction, and pain.   ACTIVITY LIMITATIONS: carrying, bending, standing, stairs, bed mobility, locomotion level, and ADLs generally impacted when pt with increased dizzy symptoms  PARTICIPATION LIMITATIONS: meal prep, cleaning, driving, shopping, community activity, and yard work  PERSONAL FACTORS: Age, Sex, Time since onset of injury/illness/exacerbation, and 3+ comorbidities: PMH significant for athritis, breast CA (2006 bilat breast-lobular carcinoma in situ), headache, anxiety, hx of CVA, HLD, osteoporosis    are also affecting patient's functional outcome.   REHAB POTENTIAL: Good  CLINICAL DECISION MAKING: Evolving/moderate  complexity  EVALUATION COMPLEXITY: Moderate   PLAN:  PT FREQUENCY: 1-2x/week  PT DURATION: 8 weeks  PLANNED INTERVENTIONS: 97164- PT Re-evaluation, 97110-Therapeutic exercises, 97530- Therapeutic activity, 97112- Neuromuscular re-education, 97535- Self Care, 91478- Manual therapy, [phone]- Gait training, [phone]- Canalith repositioning, Patient/Family education, Balance training, Stair training, Taping, Joint mobilization, Spinal mobilization, Vestibular training, DME instructions, Cryotherapy, and Moist heat  PLAN FOR NEXT SESSION:strength/conditioning program, manual, habituation   Samie Crews, PT 08/01/2023, 4:04 PM

## 2023-08-03 ENCOUNTER — Ambulatory Visit

## 2023-08-03 DIAGNOSIS — R262 Difficulty in walking, not elsewhere classified: Secondary | ICD-10-CM | POA: Diagnosis not present

## 2023-08-03 DIAGNOSIS — R2681 Unsteadiness on feet: Secondary | ICD-10-CM

## 2023-08-03 DIAGNOSIS — M542 Cervicalgia: Secondary | ICD-10-CM | POA: Diagnosis not present

## 2023-08-03 DIAGNOSIS — M6281 Muscle weakness (generalized): Secondary | ICD-10-CM | POA: Diagnosis not present

## 2023-08-03 DIAGNOSIS — R42 Dizziness and giddiness: Secondary | ICD-10-CM

## 2023-08-03 NOTE — Therapy (Signed)
 OUTPATIENT PHYSICAL THERAPY VESTIBULAR TREATMENT     Patient Name: Amanda Osborn MRN: [id number] DOB:[birth year], 68 y.o., female Today's Date: 08/03/2023  END OF SESSION:  PT End of Session - 08/03/23 0930     Visit Number 7    Number of Visits 17    Date for PT Re-Evaluation 09/06/23    PT Start Time 0932    PT Stop Time 1013    PT Time Calculation (min) 41 min    Equipment Utilized During Treatment Gait belt    Activity Tolerance Patient tolerated treatment well    Behavior During Therapy WFL for tasks assessed/performed             Past Medical History:  Diagnosis Date   Allergy    Anxiety    Arthritis    Breast cancer (HCC) 2006   BIL BREASTS-LOBULAR CARCINOMA IN SITU   Dizziness    Headache    History of CVA (cerebrovascular accident)    Hyperlipidemia    Osteoporosis    Stroke (HCC) 2019   NO RESIDUAL EFFECTS   Past Surgical History:  Procedure Laterality Date   AUGMENTATION MAMMAPLASTY Bilateral 2011   BREAST CYST ASPIRATION Bilateral    BREAST EXCISIONAL BIOPSY Left 2006   breast ca   BREAST LUMPECTOMY     CARDIAC CATHETERIZATION     COLONOSCOPY     COLONOSCOPY WITH PROPOFOL  N/A 06/28/2016   Procedure: COLONOSCOPY WITH PROPOFOL ;  Surgeon: Deveron Fly, MD;  Location: [facility] ENDOSCOPY;  Service: Endoscopy;  Laterality: N/A;   CYSTOSCOPY W/ URETERAL STENT PLACEMENT Left 09/21/2019   Procedure: CYSTOSCOPY WITH RETROGRADE PYELOGRAM/URETERAL STENT PLACEMENT;  Surgeon: Lawerence Pressman, MD;  Location: [facility] ORS;  Service: Urology;  Laterality: Left;   LYMPH NODE BIOPSY     NECK   MASTECTOMY Bilateral    TEE WITHOUT CARDIOVERSION N/A 12/30/2017   Procedure: TRANSESOPHAGEAL ECHOCARDIOGRAM (TEE);  Surgeon: Ronney Cola, MD;  Location: [facility] ORS;  Service: Cardiovascular;  Laterality: N/A;   TOTAL VAGINAL HYSTERECTOMY     URETEROSCOPY Left 09/21/2019   Procedure: URETEROSCOPY-DIAGNOSTIC;  Surgeon: Lawerence Pressman, MD;  Location: [facility] ORS;   Service: Urology;  Laterality: Left;   VAGINAL HYSTERECTOMY     Patient Active Problem List   Diagnosis Date Noted   Pelvic pain in female 08/30/2019   Dizziness 03/21/2019   CVA (cerebral vascular accident) (HCC) 12/29/2017   Slurred speech 12/28/2017   Malignant neoplastic disease (HCC) 03/18/2015   Chondromalacia of patella 09/13/2014   Bursitis 09/13/2014   Allergic rhinitis 09/13/2014   Cancer (HCC) 09/13/2014   Cervical disc disease 09/13/2014    PCP: Clarise Crooks, MD REFERRING PROVIDER: Devora Folks, Linnell Richardson, MD   REFERRING DIAG:  Diagnosis  G43.809 (ICD-10-CM) - Vestibular migraine  R42 (ICD-10-CM) - Dizziness and giddiness    THERAPY DIAG:  Unsteadiness on feet  Dizziness and giddiness  Cervicalgia  ONSET DATE: 2019  Rationale for Evaluation and Treatment: Rehabilitation  SUBJECTIVE:   SUBJECTIVE STATEMENT: Pt reports dizziness has been a little bit worse over the past few days. Pt states, "I just can't walk straight."  She says dizziness is both unsteadiness and head symptoms. Headaches continue to be sporadic. Pt hasn't been taking anything for it. Neck is still sore but better than it was.     Pt accompanied by: self  PERTINENT HISTORY:   FROM EVAL: Pt had a mild stroke in 2019. She has dealt with dizziness since the stroke. She reports her dizziness  has worsened over last two years, balance is not good, and she has headaches. Pt has experienced vertigo (says went to ED for this 2x) with vomiting. Now her dizziness is more feeling as if she is moving and not so much spinning. She reports 2 falls in her home in the last six months. She stumbles when she gets up and is not sure on her feet/not steady. It's always there. Pt used to take Meclizine  (not currently) and reports she is also taking medication for migraine 1x/day. She rates current dizziness as 3/10. Balance is noticeably worse in the dark, difficulty with uneven surfaces. Not using an AD, pt furniture  surfs. Pt has a dull HA most of the time, comes on random/instantly. Pt has ringing in both ears (this started three weeks ago); saw ENT said hearing was OK but did not have ringing in ears at the time.  Pt reports she can't hear as well recently. This is an issue for both ears but mainly L side. No increased dizziness as passenger in a car, some dizziness in a busy store, no dizziness with watching TV, but dizzy with scrolling on a phone/looking at it for a long time (feels bad when she looks back up). No symptom increase with head tilt back, but present with tilt forward/down, dizziness with quick horizontal head turns and pt reports sometimes she can feel somewhat dizzy rolling side to side in her bed. Pt reports hx of VNG testing which was unremarkable. Pt reports she has zero energy since this all began. Pt used to use treadmill, but has not recently due to symptoms. PMH significant for athritis, breast CA (2006 bilat breast-lobular carcinoma in situ), headache, anxiety, hx of CVA, HLD, osteoporosis    PAIN:  Are you having pain?  Pt reports pain "all over" most noticably in her neck and back, says neck pain worsened in last 2-3 months, back problems ongoing for 7 years  PRECAUTIONS: Fall  RED FLAGS: None   WEIGHT BEARING RESTRICTIONS: No  FALLS: Has patient fallen in last 6 months?  Yes 2  LIVING ENVIRONMENT: deferred  PLOF: Independent  PATIENT GOALS: Any improvement will help (balance, dizzy symptoms)  OBJECTIVE:  Note: Objective measures were completed at Evaluation unless otherwise noted.  DIAGNOSTIC FINDINGS:  via chart  DG cervical spine 03/02/23: " FINDINGS:  No fracture, dislocation or subluxation. Minimal anterolisthesis C7  with respect to T1. No osteolytic or osteoblastic changes.  Prevertebral and cervical cranial soft tissues are unremarkable.   Degenerative disc disease noted with disc space narrowing and  marginal osteophytes most severely at C4-5.    IMPRESSION:  Degenerative changes. No acute osseous abnormalities. No change from  prior study    Electronically Signed    By: Sydell Eva M.D.    On: 03/02/2023 22:26  "  MR BRAIN 03/01/23:  "FINDINGS: Brain: No acute infarction, hemorrhage, hydrocephalus, extra-axial collection or mass lesion. Mild chronic small vessel ischemia in the cerebral white matter. Age normal brain volume. Small chronic left cerebellar infarct. No abnormal enhancement. No evidence of retrocochlear lesion.   Vascular: Normal flow voids.   Skull and upper cervical spine: Normal marrow signal.   Sinuses/Orbits: Negative.   IMPRESSION: 1. No recent insult or specific explanation for symptoms. 2. Small chronic left cerebellar infarct present since at least 2019. Mild chronic small vessel ischemia.     Electronically Signed   By: Ronnette Coke M.D.   On: 03/01/2023 07:29"   CT ANGIO HEAD NECK  1024/24: " IMPRESSION: 1. No intracranial large vessel occlusion or significant stenosis. 2. No hemodynamically significant stenosis in the neck. 3. No evidence of vertebral or carotid artery dissection.     Electronically Signed   By: Zoila Hines M.D.   On: 02/03/2023 18:37"  CT HEAD 02/03/23: " FINDINGS: Brain: No evidence of acute infarction, hemorrhage, hydrocephalus, extra-axial collection or mass lesion/mass effect. Small remote cerebellar infarct.   Vascular: No hyperdense vessel.   Skull: Normal. Negative for fracture or focal lesion.   Sinuses/Orbits: Clear sinuses.  No acute orbital findings.   Other: No mastoid effusions.   IMPRESSION: No evidence of acute intracranial abnormality.     Electronically Signed   By: Stevenson Elbe M.D.   On: 02/03/2023 15:32"  COGNITION: Overall cognitive status: Within functional limits for tasks assessed   SENSATION: Not assessed   POSTURE:  Elevated shoulders L more than R   Cervical ROM:   07/19/23 Cervical AROM  measurements: Extension 55 deg, some pain  Flexion: 50 deg Lateral flexion 40 deg bilateral and pain-limited  Rotation 73 R, 63 L - some pain limitation   STRENGTH:   LOWER EXTREMITY MMT:  deferred  BED MOBILITY:  Some impairment due to dizziness (such as rolling)  TRANSFERS: Assistive device utilized: None  Sit to stand: Complete Independence Stand to sit: Complete Independence Chair to chair: Complete Independence *pt did report impairment with sit>stand with increased dizziness symptoms    GAIT: Gait pattern:  increased variability in BOS/unsteadiness with scanning Distance walked: clinic distances Assistive device utilized: None Level of assistance: SBA and CGA  FUNCTIONAL TESTS:  Dynamic Gait Index: 21/24  PATIENT SURVEYS:  ABC scale 48.69 DHI 68  VESTIBULAR ASSESSMENT:  OCULOMOTOR EXAM:  Ocular Alignment: normal  Ocular ROM: No Limitations  Spontaneous Nystagmus: absent  Gaze-Induced Nystagmus: absent  Smooth Pursuits: saccades - mild, likely WNL for age  Saccades: hypometric/undershoots with corrective saccade   Convergence/Divergence: sees double approx 4" from face     VESTIBULAR - OCULAR REFLEX: 07/14/2023  Slow VOR: WNL  VOR Cancellation: WNL   Head-Impulse Test:  WNL bilat       POSITIONAL TESTING: not assessed, pt reports hx positional testing with no findings             TREATMENT DATE: 08/03/23 Gait belt donned throughout for pt safety and CGA provided unless otherwise noted   NMR: the following interventions were performed to promote habituation and conditioning to activities to reduce dizziness with activities  NBOS EC 4x30 - started with increased sway/reduced with reps  NBOS EC on airex 4x30 sec   SLB 2x30 sec each LE with UUE support   Alt LE cone taps for SLB progression x multiple reps each LE   Tandem gait 2x10 meters   Slow march 2x10 meters   Gait in hallway reading sticky notes to incorporate vertical and horizontal  head turns 4x80 ft - challenging  Gait with vertical head turns only -2x10 - difficult, greatest decrease in balance, CGA-min a  Added to HEP and reviewed with pt:  Access Code: [email or web address] URL: [email or web address] Date: 08/03/2023 Prepared by: Aminta Kales  Exercises - Standing Single Leg Stance with Counter Support  - 1 x daily - 6-7 x weekly - 2 sets - 1 reps - 30 seconds/leg hold  Conditioning: Leg press 40# 3x10 bilat - medium Calf press 40# 10x; 25# 2x10 bilat Standing hip abduction 2x12 each LE  Mini squats 15x Comments: no headache or dizziness with  conditioning interventions      PATIENT EDUCATION: Education details: exercise technique Person educated: Patient Education method: Explanation Education comprehension: verbalized understanding  HOME EXERCISE PROGRAM:   Added to HEP:  Access Code: [email or web address] URL: [email or web address] Date: 08/03/2023 Prepared by: Aminta Kales  Exercises - Standing Single Leg Stance with Counter Support  - 1 x daily - 6-7 x weekly - 2 sets - 1 reps - 30 seconds/leg hold   Access Code: [email or web address] URL: [email or web address] Date: 07/14/2023 Prepared by: Aminta Kales  Exercises - Seated Head Nods Vestibular Habituation  - 1 x daily - 6-7 x weekly - 2 sets - 5 reps - Vestibular Habituation - Seated Horizontal Head Rotation  - 1 x daily - 6-7 x weekly - 2 sets - 5 reps - Seated Scapular Retraction  - 1 x daily - 7 x weekly - 2 sets - 10 reps - Seated Upper Trapezius Stretch  - 1 x daily - 7 x weekly - 2 sets - 1 reps - 30 seconds/side hold - Walking on Treadmill  - 1 x daily - 3-5 x weekly - 1 sets - 1 reps - 5 minutes hold  - hold on to treadmill  GOALS:  Goals reviewed with patient? Yes, initiated, will continue review with completion of further assessment/testing  SHORT TERM GOALS: Target date: 08/31/2023   Patient will be independent in home exercise program to improve dizziness, balance  and mobility for better functional independence with ADLs and increased QOL. Baseline: Goal status: INITIAL   LONG TERM GOALS: Target date: 09/28/2023     1.  Patient will reduce dizziness handicap inventory score to <50, for less dizziness with ADLs and increased safety with home and work tasks.  Baseline:  68 Goal status: INITIAL  2.  Patient will increase ABC scale score >80% to demonstrate better functional mobility and better confidence with ADLs.  Baseline:  48.69% Goal status: INITIAL  3.  Patient will increase dynamic gait index score to >22/24 as to demonstrate  improved dynamic gait balance for better safety with community/home ambulation and ability to scan environment without LOB.  Baseline: 21/24 Goal status: INITIAL  4. The pt will report ability to complete cardio program at home (e.g. with using her treadmill) for at least 10 minutes 3x/week with no greater than mild dizzy symptoms to return to PLOF.  Baseline: not currently completing cardio program  Goal status: INITIAL  5. The pt will exhibit ability to complete at least 10 reps of cervical flexion>neutral cervical position without increase in dizziness symptoms from baseline for improved mobility/ability to complete ADLs.  Baseline: movement currently triggers symptom increase  Goal status: INITIAL   ASSESSMENT:  CLINICAL IMPRESSION: Pt presents reporting worse symptoms. Pt generally required no greater than CGA with activities. She required min a with gait with vertical head turns and did report this felt more difficult than other gait-balance activities. Pt also continues to exhibit difficulty with SLB. SLB at counter with support added to HEP as a result. The pt will benefit from further skilled PT to improve deficits in order to increase balance, QOL, decrease fall risk and dizziness.  OBJECTIVE IMPAIRMENTS: Abnormal gait, decreased activity tolerance, decreased balance, decreased coordination, decreased  mobility, difficulty walking, dizziness, improper body mechanics, postural dysfunction, and pain.   ACTIVITY LIMITATIONS: carrying, bending, standing, stairs, bed mobility, locomotion level, and ADLs generally impacted when pt with increased dizzy symptoms  PARTICIPATION LIMITATIONS: meal prep, cleaning, driving, shopping, community activity, and yard work  PERSONAL FACTORS:  Age, Sex, Time since onset of injury/illness/exacerbation, and 3+ comorbidities: PMH significant for athritis, breast CA (2006 bilat breast-lobular carcinoma in situ), headache, anxiety, hx of CVA, HLD, osteoporosis    are also affecting patient's functional outcome.   REHAB POTENTIAL: Good  CLINICAL DECISION MAKING: Evolving/moderate complexity  EVALUATION COMPLEXITY: Moderate   PLAN:  PT FREQUENCY: 1-2x/week  PT DURATION: 8 weeks  PLANNED INTERVENTIONS: 97164- PT Re-evaluation, 97110-Therapeutic exercises, 97530- Therapeutic activity, 97112- Neuromuscular re-education, 97535- Self Care, 16010- Manual therapy, [phone]- Gait training, [phone]- Canalith repositioning, Patient/Family education, Balance training, Stair training, Taping, Joint mobilization, Spinal mobilization, Vestibular training, DME instructions, Cryotherapy, and Moist heat  PLAN FOR NEXT SESSION:strength/conditioning program, manual, habituation, Access Code: [email or web address] URL: [email or web address] Date: 08/03/2023 Prepared by: Aminta Kales  Exercises - Standing Single Leg Stance with Counter Support  - 1 x daily - 6-7 x weekly - 2 sets - 1 reps - 30 seconds/leg hold, positional testing with vestibular goggles   Samie Crews, PT 08/03/2023, 10:25 AM

## 2023-08-08 ENCOUNTER — Ambulatory Visit

## 2023-08-08 DIAGNOSIS — R2681 Unsteadiness on feet: Secondary | ICD-10-CM | POA: Diagnosis not present

## 2023-08-08 DIAGNOSIS — M6281 Muscle weakness (generalized): Secondary | ICD-10-CM | POA: Diagnosis not present

## 2023-08-08 DIAGNOSIS — R262 Difficulty in walking, not elsewhere classified: Secondary | ICD-10-CM | POA: Diagnosis not present

## 2023-08-08 DIAGNOSIS — R42 Dizziness and giddiness: Secondary | ICD-10-CM

## 2023-08-08 DIAGNOSIS — M542 Cervicalgia: Secondary | ICD-10-CM | POA: Diagnosis not present

## 2023-08-08 NOTE — Therapy (Signed)
 OUTPATIENT PHYSICAL THERAPY VESTIBULAR TREATMENT     Patient Name: Amanda Osborn MRN: [id number] DOB:[birth year], 68 y.o., female Today's Date: 08/08/2023  END OF SESSION:  PT End of Session - 08/08/23 1147     Visit Number 8    Number of Visits 17    Date for PT Re-Evaluation 09/06/23    PT Start Time 1149    PT Stop Time 1230    PT Time Calculation (min) 41 min    Equipment Utilized During Treatment Gait belt    Activity Tolerance Patient tolerated treatment well    Behavior During Therapy WFL for tasks assessed/performed             Past Medical History:  Diagnosis Date   Allergy    Anxiety    Arthritis    Breast cancer (HCC) 2006   BIL BREASTS-LOBULAR CARCINOMA IN SITU   Dizziness    Headache    History of CVA (cerebrovascular accident)    Hyperlipidemia    Osteoporosis    Stroke (HCC) 2019   NO RESIDUAL EFFECTS   Past Surgical History:  Procedure Laterality Date   AUGMENTATION MAMMAPLASTY Bilateral 2011   BREAST CYST ASPIRATION Bilateral    BREAST EXCISIONAL BIOPSY Left 2006   breast ca   BREAST LUMPECTOMY     CARDIAC CATHETERIZATION     COLONOSCOPY     COLONOSCOPY WITH PROPOFOL  N/A 06/28/2016   Procedure: COLONOSCOPY WITH PROPOFOL ;  Surgeon: Deveron Fly, MD;  Location: [facility] ENDOSCOPY;  Service: Endoscopy;  Laterality: N/A;   CYSTOSCOPY W/ URETERAL STENT PLACEMENT Left 09/21/2019   Procedure: CYSTOSCOPY WITH RETROGRADE PYELOGRAM/URETERAL STENT PLACEMENT;  Surgeon: Lawerence Pressman, MD;  Location: [facility] ORS;  Service: Urology;  Laterality: Left;   LYMPH NODE BIOPSY     NECK   MASTECTOMY Bilateral    TEE WITHOUT CARDIOVERSION N/A 12/30/2017   Procedure: TRANSESOPHAGEAL ECHOCARDIOGRAM (TEE);  Surgeon: Ronney Cola, MD;  Location: [facility] ORS;  Service: Cardiovascular;  Laterality: N/A;   TOTAL VAGINAL HYSTERECTOMY     URETEROSCOPY Left 09/21/2019   Procedure: URETEROSCOPY-DIAGNOSTIC;  Surgeon: Lawerence Pressman, MD;  Location: [facility] ORS;   Service: Urology;  Laterality: Left;   VAGINAL HYSTERECTOMY     Patient Active Problem List   Diagnosis Date Noted   Pelvic pain in female 08/30/2019   Dizziness 03/21/2019   CVA (cerebral vascular accident) (HCC) 12/29/2017   Slurred speech 12/28/2017   Malignant neoplastic disease (HCC) 03/18/2015   Chondromalacia of patella 09/13/2014   Bursitis 09/13/2014   Allergic rhinitis 09/13/2014   Cancer (HCC) 09/13/2014   Cervical disc disease 09/13/2014    PCP: Clarise Crooks, MD REFERRING PROVIDER: Devora Folks, Linnell Richardson, MD   REFERRING DIAG:  Diagnosis  G43.809 (ICD-10-CM) - Vestibular migraine  R42 (ICD-10-CM) - Dizziness and giddiness    THERAPY DIAG:  Dizziness and giddiness  Unsteadiness on feet  ONSET DATE: 2019  Rationale for Evaluation and Treatment: Rehabilitation  SUBJECTIVE:   SUBJECTIVE STATEMENT: Pt feels about the same, not feeling well today. Pt still having frequent, sporadic headaches.  Pt accompanied by: self  PERTINENT HISTORY:   FROM EVAL: Pt had a mild stroke in 2019. She has dealt with dizziness since the stroke. She reports her dizziness has worsened over last two years, balance is not good, and she has headaches. Pt has experienced vertigo (says went to ED for this 2x) with vomiting. Now her dizziness is more feeling as if she is moving and not so much  spinning. She reports 2 falls in her home in the last six months. She stumbles when she gets up and is not sure on her feet/not steady. It's always there. Pt used to take Meclizine  (not currently) and reports she is also taking medication for migraine 1x/day. She rates current dizziness as 3/10. Balance is noticeably worse in the dark, difficulty with uneven surfaces. Not using an AD, pt furniture surfs. Pt has a dull HA most of the time, comes on random/instantly. Pt has ringing in both ears (this started three weeks ago); saw ENT said hearing was OK but did not have ringing in ears at the time.  Pt reports  she can't hear as well recently. This is an issue for both ears but mainly L side. No increased dizziness as passenger in a car, some dizziness in a busy store, no dizziness with watching TV, but dizzy with scrolling on a phone/looking at it for a long time (feels bad when she looks back up). No symptom increase with head tilt back, but present with tilt forward/down, dizziness with quick horizontal head turns and pt reports sometimes she can feel somewhat dizzy rolling side to side in her bed. Pt reports hx of VNG testing which was unremarkable. Pt reports she has zero energy since this all began. Pt used to use treadmill, but has not recently due to symptoms. PMH significant for athritis, breast CA (2006 bilat breast-lobular carcinoma in situ), headache, anxiety, hx of CVA, HLD, osteoporosis    PAIN:  Are you having pain?  Pt reports pain "all over" most noticably in her neck and back, says neck pain worsened in last 2-3 months, back problems ongoing for 7 years  PRECAUTIONS: Fall  RED FLAGS: None   WEIGHT BEARING RESTRICTIONS: No  FALLS: Has patient fallen in last 6 months?  Yes 2  LIVING ENVIRONMENT: deferred  PLOF: Independent  PATIENT GOALS: Any improvement will help (balance, dizzy symptoms)  OBJECTIVE:  Note: Objective measures were completed at Evaluation unless otherwise noted.  DIAGNOSTIC FINDINGS:  via chart  DG cervical spine 03/02/23: " FINDINGS:  No fracture, dislocation or subluxation. Minimal anterolisthesis C7  with respect to T1. No osteolytic or osteoblastic changes.  Prevertebral and cervical cranial soft tissues are unremarkable.   Degenerative disc disease noted with disc space narrowing and  marginal osteophytes most severely at C4-5.   IMPRESSION:  Degenerative changes. No acute osseous abnormalities. No change from  prior study    Electronically Signed    By: Sydell Eva M.D.    On: 03/02/2023 22:26  "  MR BRAIN  03/01/23:  "FINDINGS: Brain: No acute infarction, hemorrhage, hydrocephalus, extra-axial collection or mass lesion. Mild chronic small vessel ischemia in the cerebral white matter. Age normal brain volume. Small chronic left cerebellar infarct. No abnormal enhancement. No evidence of retrocochlear lesion.   Vascular: Normal flow voids.   Skull and upper cervical spine: Normal marrow signal.   Sinuses/Orbits: Negative.   IMPRESSION: 1. No recent insult or specific explanation for symptoms. 2. Small chronic left cerebellar infarct present since at least 2019. Mild chronic small vessel ischemia.     Electronically Signed   By: Ronnette Coke M.D.   On: 03/01/2023 07:29"   CT ANGIO HEAD NECK 1024/24: " IMPRESSION: 1. No intracranial large vessel occlusion or significant stenosis. 2. No hemodynamically significant stenosis in the neck. 3. No evidence of vertebral or carotid artery dissection.     Electronically Signed   By: Domenick Friedlander.D.  On: 02/03/2023 18:37"  CT HEAD 02/03/23: " FINDINGS: Brain: No evidence of acute infarction, hemorrhage, hydrocephalus, extra-axial collection or mass lesion/mass effect. Small remote cerebellar infarct.   Vascular: No hyperdense vessel.   Skull: Normal. Negative for fracture or focal lesion.   Sinuses/Orbits: Clear sinuses.  No acute orbital findings.   Other: No mastoid effusions.   IMPRESSION: No evidence of acute intracranial abnormality.     Electronically Signed   By: Stevenson Elbe M.D.   On: 02/03/2023 15:32"  COGNITION: Overall cognitive status: Within functional limits for tasks assessed   SENSATION: Not assessed   POSTURE:  Elevated shoulders L more than R   Cervical ROM:   07/19/23 Cervical AROM measurements: Extension 55 deg, some pain  Flexion: 50 deg Lateral flexion 40 deg bilateral and pain-limited  Rotation 73 R, 63 L - some pain limitation   STRENGTH:   LOWER EXTREMITY MMT:   deferred  BED MOBILITY:  Some impairment due to dizziness (such as rolling)  TRANSFERS: Assistive device utilized: None  Sit to stand: Complete Independence Stand to sit: Complete Independence Chair to chair: Complete Independence *pt did report impairment with sit>stand with increased dizziness symptoms    GAIT: Gait pattern:  increased variability in BOS/unsteadiness with scanning Distance walked: clinic distances Assistive device utilized: None Level of assistance: SBA and CGA  FUNCTIONAL TESTS:  Dynamic Gait Index: 21/24  PATIENT SURVEYS:  ABC scale 48.69 DHI 68  VESTIBULAR ASSESSMENT:  OCULOMOTOR EXAM:  Ocular Alignment: normal  Ocular ROM: No Limitations  Spontaneous Nystagmus: absent  Gaze-Induced Nystagmus: absent  Smooth Pursuits: saccades - mild, likely WNL for age  Saccades: hypometric/undershoots with corrective saccade   Convergence/Divergence: sees double approx 4" from face     VESTIBULAR - OCULAR REFLEX: 07/14/2023  Slow VOR: WNL  VOR Cancellation: WNL   Head-Impulse Test:  WNL bilat       POSITIONAL TESTING: not assessed, pt reports hx positional testing with no findings             TREATMENT DATE: 08/08/23 Gait belt donned throughout for pt safety and CGA provided unless otherwise noted    NMR: the following interventions were performed to promote balance, habituation and conditioning to activities to reduce dizziness with activities  Gait outdoors over inclines/declines, turns completed with horizontal or vertical head turns and tandem gait -x several minutes. Pt rates hard, mild unsteadiness with majority of activities.  Habituation- bending to pick up cone: 12" step 5x 6" step 5x From floor 5x Picking up cone from floor and under chair 5x - requires a R head turn makes pt somewhat dizzy, increases dizziness to moderate level  SLB 2x30 sec each LE - intermittent UE support   NBOS EC 2x30 - slight increased sway/reduced with reps    Conditioning: Leg press 40# 1x10 bilat -rates medium -- 55# 2x10 Calf press 25# 1x10 bilat - rates medium Standing hip abduction 2# aw each LE 2x10 each LE  Comments still with some mild dizziness with the above    PATIENT EDUCATION: Education details: exercise technique Person educated: Patient Education method: Explanation Education comprehension: verbalized understanding  HOME EXERCISE PROGRAM:   Added to HEP:  Access Code: [email or web address] URL: [email or web address] Date: 08/03/2023 Prepared by: Aminta Kales  Exercises - Standing Single Leg Stance with Counter Support  - 1 x daily - 6-7 x weekly - 2 sets - 1 reps - 30 seconds/leg hold   Access Code: [email or web address] URL: [email or web address] Date: 07/14/2023 Prepared by: Aminta Kales  Exercises - Seated Head Nods Vestibular Habituation  - 1 x daily - 6-7 x weekly - 2 sets - 5 reps - Vestibular Habituation - Seated Horizontal Head Rotation  - 1 x daily - 6-7 x weekly - 2 sets - 5 reps - Seated Scapular Retraction  - 1 x daily - 7 x weekly - 2 sets - 10 reps - Seated Upper Trapezius Stretch  - 1 x daily - 7 x weekly - 2 sets - 1 reps - 30 seconds/side hold - Walking on Treadmill  - 1 x daily - 3-5 x weekly - 1 sets - 1 reps - 5 minutes hold  - hold on to treadmill  GOALS:  Goals reviewed with patient? Yes, initiated, will continue review with completion of further assessment/testing  SHORT TERM GOALS: Target date: 09/05/2023   Patient will be independent in home exercise program to improve dizziness, balance and mobility for better functional independence with ADLs and increased QOL. Baseline: Goal status: INITIAL   LONG TERM GOALS: Target date: 10/03/2023     1.  Patient will reduce dizziness handicap inventory score to <50, for less dizziness with ADLs and increased safety with home and work tasks.  Baseline:  68 Goal status: INITIAL  2.  Patient will increase ABC scale score >80% to  demonstrate better functional mobility and better confidence with ADLs.  Baseline:  48.69% Goal status: INITIAL  3.  Patient will increase dynamic gait index score to >22/24 as to demonstrate  improved dynamic gait balance for better safety with community/home ambulation and ability to scan environment without LOB.  Baseline: 21/24 Goal status: INITIAL  4. The pt will report ability to complete cardio program at home (e.g. with using her treadmill) for at least 10 minutes 3x/week with no greater than mild dizzy symptoms to return to PLOF.  Baseline: not currently completing cardio program  Goal status: INITIAL  5. The pt will exhibit ability to complete at least 10 reps of cervical flexion>neutral cervical position without increase in dizziness symptoms from baseline for improved mobility/ability to complete ADLs.  Baseline: movement currently triggers symptom increase  Goal status: INITIAL   ASSESSMENT:  CLINICAL IMPRESSION: Pt exhibited mild unsteadiness with majority of balance activities performed today. Dizziness symptoms were not triggered until pt completed a bending intervention with  R head turn. Pt remained at a mild level of dizziness for remainder of session (>20 min) indicating this activity should be targeted but modified to reduce symptom duration.The pt will benefit from further skilled PT to improve deficits in order to increase balance, QOL, decrease fall risk and dizziness.  OBJECTIVE IMPAIRMENTS: Abnormal gait, decreased activity tolerance, decreased balance, decreased coordination, decreased mobility, difficulty walking, dizziness, improper body mechanics, postural dysfunction, and pain.   ACTIVITY LIMITATIONS: carrying, bending, standing, stairs, bed mobility, locomotion level, and ADLs generally impacted when pt with increased dizzy symptoms  PARTICIPATION LIMITATIONS: meal prep, cleaning, driving, shopping, community activity, and yard work  PERSONAL FACTORS: Age,  Sex, Time since onset of injury/illness/exacerbation, and 3+ comorbidities: PMH significant for athritis, breast CA (2006 bilat breast-lobular carcinoma in situ), headache, anxiety, hx of CVA, HLD, osteoporosis    are also affecting patient's functional outcome.   REHAB POTENTIAL: Good  CLINICAL DECISION MAKING: Evolving/moderate complexity  EVALUATION COMPLEXITY: Moderate   PLAN:  PT FREQUENCY: 1-2x/week  PT DURATION: 8 weeks  PLANNED INTERVENTIONS: 97164- PT Re-evaluation, 97110-Therapeutic exercises, 97530- Therapeutic activity, [id number]- Neuromuscular re-education, 97535- Self Care, [id number]- Manual therapy, [id number]- Gait training, [phone]-  Canalith repositioning, Patient/Family education, Balance training, Stair training, Taping, Joint mobilization, Spinal mobilization, Vestibular training, DME instructions, Cryotherapy, and Moist heat  PLAN FOR NEXT SESSION:strength/conditioning program, manual, habituation, positional testing with vestibular goggles   Samie Crews, PT 08/08/2023, 2:15 PM

## 2023-08-10 ENCOUNTER — Ambulatory Visit

## 2023-08-15 ENCOUNTER — Telehealth: Payer: Self-pay

## 2023-08-15 ENCOUNTER — Ambulatory Visit: Attending: Neurology

## 2023-08-15 DIAGNOSIS — R42 Dizziness and giddiness: Secondary | ICD-10-CM | POA: Insufficient documentation

## 2023-08-15 DIAGNOSIS — R2681 Unsteadiness on feet: Secondary | ICD-10-CM | POA: Insufficient documentation

## 2023-08-15 DIAGNOSIS — M542 Cervicalgia: Secondary | ICD-10-CM | POA: Insufficient documentation

## 2023-08-15 DIAGNOSIS — R262 Difficulty in walking, not elsewhere classified: Secondary | ICD-10-CM | POA: Insufficient documentation

## 2023-08-15 NOTE — Telephone Encounter (Signed)
 PT reached out to pt via secure line d/t missed visit and to check in on pt/make sure she is ok. Pt did have to cancel last visit due to daughter being in the ICU. PT provided clinic information if pt needs to call regarding future appointments.  Aminta Kales PT, DPT

## 2023-08-17 ENCOUNTER — Ambulatory Visit

## 2023-08-22 ENCOUNTER — Ambulatory Visit

## 2023-08-22 DIAGNOSIS — M542 Cervicalgia: Secondary | ICD-10-CM

## 2023-08-22 DIAGNOSIS — R42 Dizziness and giddiness: Secondary | ICD-10-CM | POA: Diagnosis not present

## 2023-08-22 DIAGNOSIS — R2681 Unsteadiness on feet: Secondary | ICD-10-CM

## 2023-08-22 DIAGNOSIS — R262 Difficulty in walking, not elsewhere classified: Secondary | ICD-10-CM | POA: Diagnosis not present

## 2023-08-22 NOTE — Therapy (Signed)
 OUTPATIENT PHYSICAL THERAPY VESTIBULAR TREATMENT     Patient Name: Amanda Osborn MRN: [id number] DOB:10-11-1955, 68 y.o., female Today's Date: 08/22/2023  END OF SESSION:  PT End of Session - 08/22/23 1144     Visit Number 9    Number of Visits 17    Date for PT Re-Evaluation 09/06/23    PT Start Time 1150    PT Stop Time 1225    PT Time Calculation (min) 35 min    Equipment Utilized During Treatment Gait belt    Activity Tolerance Patient tolerated treatment well    Behavior During Therapy WFL for tasks assessed/performed             Past Medical History:  Diagnosis Date   Allergy    Anxiety    Arthritis    Breast cancer (HCC) 2006   BIL BREASTS-LOBULAR CARCINOMA IN SITU   Dizziness    Headache    History of CVA (cerebrovascular accident)    Hyperlipidemia    Osteoporosis    Stroke (HCC) 2019   NO RESIDUAL EFFECTS   Past Surgical History:  Procedure Laterality Date   AUGMENTATION MAMMAPLASTY Bilateral 2011   BREAST CYST ASPIRATION Bilateral    BREAST EXCISIONAL BIOPSY Left 2006   breast ca   BREAST LUMPECTOMY     CARDIAC CATHETERIZATION     COLONOSCOPY     COLONOSCOPY WITH PROPOFOL  N/A 06/28/2016   Procedure: COLONOSCOPY WITH PROPOFOL ;  Surgeon: Deveron Fly, MD;  Location: [facility] ENDOSCOPY;  Service: Endoscopy;  Laterality: N/A;   CYSTOSCOPY W/ URETERAL STENT PLACEMENT Left 09/21/2019   Procedure: CYSTOSCOPY WITH RETROGRADE PYELOGRAM/URETERAL STENT PLACEMENT;  Surgeon: Lawerence Pressman, MD;  Location: [facility] ORS;  Service: Urology;  Laterality: Left;   LYMPH NODE BIOPSY     NECK   MASTECTOMY Bilateral    TEE WITHOUT CARDIOVERSION N/A 12/30/2017   Procedure: TRANSESOPHAGEAL ECHOCARDIOGRAM (TEE);  Surgeon: Ronney Cola, MD;  Location: [facility] ORS;  Service: Cardiovascular;  Laterality: N/A;   TOTAL VAGINAL HYSTERECTOMY     URETEROSCOPY Left 09/21/2019   Procedure: URETEROSCOPY-DIAGNOSTIC;  Surgeon: Lawerence Pressman, MD;  Location: [facility] ORS;   Service: Urology;  Laterality: Left;   VAGINAL HYSTERECTOMY     Patient Active Problem List   Diagnosis Date Noted   Pelvic pain in female 08/30/2019   Dizziness 03/21/2019   CVA (cerebral vascular accident) (HCC) 12/29/2017   Slurred speech 12/28/2017   Malignant neoplastic disease (HCC) 03/18/2015   Chondromalacia of patella 09/13/2014   Bursitis 09/13/2014   Allergic rhinitis 09/13/2014   Cancer (HCC) 09/13/2014   Cervical disc disease 09/13/2014    PCP: Clarise Crooks, MD REFERRING PROVIDER: Devora Folks, Linnell Richardson, MD   REFERRING DIAG:  Diagnosis  G43.809 (ICD-10-CM) - Vestibular migraine  R42 (ICD-10-CM) - Dizziness and giddiness    THERAPY DIAG:  Dizziness and giddiness  Unsteadiness on feet  Difficulty in walking, not elsewhere classified  Cervicalgia  ONSET DATE: 2019  Rationale for Evaluation and Treatment: Rehabilitation  SUBJECTIVE:   SUBJECTIVE STATEMENT: Pt returns following absence due to family emergency. Her daughter was recently in ICU for a head injury, needed brain surgery and daughter staying with pt now, pt is taking care of her.. Pt reports balance is still not good and that the dizzy feeling goes along with it. She will need to take some time off from PT to assist her daughter.  Pt accompanied by: self  PERTINENT HISTORY:   FROM EVAL: Pt had a mild  stroke in 2019. She has dealt with dizziness since the stroke. She reports her dizziness has worsened over last two years, balance is not good, and she has headaches. Pt has experienced vertigo (says went to ED for this 2x) with vomiting. Now her dizziness is more feeling as if she is moving and not so much spinning. She reports 2 falls in her home in the last six months. She stumbles when she gets up and is not sure on her feet/not steady. It's always there. Pt used to take Meclizine  (not currently) and reports she is also taking medication for migraine 1x/day. She rates current dizziness as 3/10. Balance  is noticeably worse in the dark, difficulty with uneven surfaces. Not using an AD, pt furniture surfs. Pt has a dull HA most of the time, comes on random/instantly. Pt has ringing in both ears (this started three weeks ago); saw ENT said hearing was OK but did not have ringing in ears at the time.  Pt reports she can't hear as well recently. This is an issue for both ears but mainly L side. No increased dizziness as passenger in a car, some dizziness in a busy store, no dizziness with watching TV, but dizzy with scrolling on a phone/looking at it for a long time (feels bad when she looks back up). No symptom increase with head tilt back, but present with tilt forward/down, dizziness with quick horizontal head turns and pt reports sometimes she can feel somewhat dizzy rolling side to side in her bed. Pt reports hx of VNG testing which was unremarkable. Pt reports she has zero energy since this all began. Pt used to use treadmill, but has not recently due to symptoms. PMH significant for athritis, breast CA (2006 bilat breast-lobular carcinoma in situ), headache, anxiety, hx of CVA, HLD, osteoporosis    PAIN:  Are you having pain? Pt reports pain "all over" most noticably in her neck and back, says neck pain worsened in last 2-3 months, back problems ongoing for 7 years  PRECAUTIONS: Fall  RED FLAGS: None   WEIGHT BEARING RESTRICTIONS: No  FALLS: Has patient fallen in last 6 months? Yes 2  LIVING ENVIRONMENT: deferred  PLOF: Independent  PATIENT GOALS: Any improvement will help (balance, dizzy symptoms)  OBJECTIVE:  Note: Objective measures were completed at Evaluation unless otherwise noted.  DIAGNOSTIC FINDINGS:  via chart  DG cervical spine 03/02/23: " FINDINGS:  No fracture, dislocation or subluxation. Minimal anterolisthesis C7  with respect to T1. No osteolytic or osteoblastic changes.  Prevertebral and cervical cranial soft tissues are unremarkable.   Degenerative disc disease  noted with disc space narrowing and  marginal osteophytes most severely at C4-5.   IMPRESSION:  Degenerative changes. No acute osseous abnormalities. No change from  prior study    Electronically Signed    By: Sydell Eva M.D.    On: 03/02/2023 22:26  "  MR BRAIN 03/01/23:  "FINDINGS: Brain: No acute infarction, hemorrhage, hydrocephalus, extra-axial collection or mass lesion. Mild chronic small vessel ischemia in the cerebral white matter. Age normal brain volume. Small chronic left cerebellar infarct. No abnormal enhancement. No evidence of retrocochlear lesion.   Vascular: Normal flow voids.   Skull and upper cervical spine: Normal marrow signal.   Sinuses/Orbits: Negative.   IMPRESSION: 1. No recent insult or specific explanation for symptoms. 2. Small chronic left cerebellar infarct present since at least 2019. Mild chronic small vessel ischemia.     Electronically Signed   By: Arlyce Lambert  Watts M.D.   On: 03/01/2023 07:29"   CT ANGIO HEAD NECK 1024/24: " IMPRESSION: 1. No intracranial large vessel occlusion or significant stenosis. 2. No hemodynamically significant stenosis in the neck. 3. No evidence of vertebral or carotid artery dissection.     Electronically Signed   By: Zoila Hines M.D.   On: 02/03/2023 18:37"  CT HEAD 02/03/23: " FINDINGS: Brain: No evidence of acute infarction, hemorrhage, hydrocephalus, extra-axial collection or mass lesion/mass effect. Small remote cerebellar infarct.   Vascular: No hyperdense vessel.   Skull: Normal. Negative for fracture or focal lesion.   Sinuses/Orbits: Clear sinuses.  No acute orbital findings.   Other: No mastoid effusions.   IMPRESSION: No evidence of acute intracranial abnormality.     Electronically Signed   By: Stevenson Elbe M.D.   On: 02/03/2023 15:32"  COGNITION: Overall cognitive status: Within functional limits for tasks assessed   SENSATION: Not assessed   POSTURE:   Elevated shoulders L more than R   Cervical ROM:   07/19/23 Cervical AROM measurements: Extension 55 deg, some pain  Flexion: 50 deg Lateral flexion 40 deg bilateral and pain-limited  Rotation 73 R, 63 L - some pain limitation   STRENGTH:   LOWER EXTREMITY MMT:  deferred  BED MOBILITY:  Some impairment due to dizziness (such as rolling)  TRANSFERS: Assistive device utilized: None  Sit to stand: Complete Independence Stand to sit: Complete Independence Chair to chair: Complete Independence *pt did report impairment with sit>stand with increased dizziness symptoms    GAIT: Gait pattern: increased variability in BOS/unsteadiness with scanning Distance walked: clinic distances Assistive device utilized: None Level of assistance: SBA and CGA  FUNCTIONAL TESTS:  Dynamic Gait Index: 21/24  PATIENT SURVEYS:  ABC scale 48.69 DHI 68  VESTIBULAR ASSESSMENT:  OCULOMOTOR EXAM:  Ocular Alignment: normal  Ocular ROM: No Limitations  Spontaneous Nystagmus: absent  Gaze-Induced Nystagmus: absent  Smooth Pursuits: saccades - mild, likely WNL for age  Saccades: hypometric/undershoots with corrective saccade   Convergence/Divergence: sees double approx 4" from face     VESTIBULAR - OCULAR REFLEX: 07/14/2023  Slow VOR: WNL  VOR Cancellation: WNL   Head-Impulse Test:  WNL bilat       POSITIONAL TESTING: not assessed, pt reports hx positional testing with no findings             TREATMENT DATE: 08/22/23 Gait belt donned throughout for pt safety and CGA provided unless otherwise noted    Physical Performance: PT instructed pt in DGI. See below for results. Demonstrates increased fall risk with score of 21/24. (<19 indicates increased fall risk)    [facility] PT Assessment - 08/22/23 0001       Dynamic Gait Index   Level Surface Normal    Change in Gait Speed Normal    Gait with Horizontal Head Turns Mild Impairment    Gait with Vertical Head Turns Mild Impairment    Gait  and Pivot Turn Normal    Step Over Obstacle Normal    Step Around Obstacles Normal    Steps Mild Impairment    Total Score 21            *Slight dizziness with head turns   NMR: DHI: 46 At support surface- SLB 2x30 sec each LE  Standing corner EC 2x30 sec -steady  10 reps of cervical flexion>neutral cervical position - no dizziness Please refer to goal section below for remaining goal question & answer   Updated & reviewed in session  with pt additionally completed sets and reps of walking with head rotation and nod as prescribed below: Access Code: [email or web address] URL: [email or web address] Date: 08/22/2023 Prepared by: Aminta Kales  Exercises - Standing Single Leg Stance with Counter Support  - 1 x daily - 6-7 x weekly - 2 sets - 1 reps - 30 seconds/leg hold - Walking with Head Rotation  - 1 x daily - 7 x weekly - 2 sets - 1 reps - 60 sec hold - Walking with Head Nod  - 1 x daily - 7 x weekly - 2 sets - 1 reps - 60 sec  hold    PATIENT EDUCATION: Education details: exercise technique Person educated: Patient Education method: Explanation Education comprehension: verbalized understanding  HOME EXERCISE PROGRAM:   Updated 5/12: Access Code: [email or web address] URL: [email or web address] Date: 08/22/2023 Prepared by: Aminta Kales  Exercises - Standing Single Leg Stance with Counter Support  - 1 x daily - 6-7 x weekly - 2 sets - 1 reps - 30 seconds/leg hold - Walking with Head Rotation  - 1 x daily - 7 x weekly - 2 sets - 1 reps - 60 sec hold - Walking with Head Nod  - 1 x daily - 7 x weekly - 2 sets - 1 reps - 60 sec  hold   PREVIOUS Added to HEP:  Access Code: [email or web address] URL: [email or web address] Date: 08/03/2023 Prepared by: Aminta Kales  Exercises - Standing Single Leg Stance with Counter Support  - 1 x daily - 6-7 x weekly - 2 sets - 1 reps - 30 seconds/leg hold   Access Code: [email or web address] URL:  [email or web address] Date: 07/14/2023 Prepared by: Aminta Kales  Exercises - Seated Head Nods Vestibular Habituation  - 1 x daily - 6-7 x weekly - 2 sets - 5 reps - Vestibular Habituation - Seated Horizontal Head Rotation  - 1 x daily - 6-7 x weekly - 2 sets - 5 reps - Seated Scapular Retraction  - 1 x daily - 7 x weekly - 2 sets - 10 reps - Seated Upper Trapezius Stretch  - 1 x daily - 7 x weekly - 2 sets - 1 reps - 30 seconds/side hold - Walking on Treadmill  - 1 x daily - 3-5 x weekly - 1 sets - 1 reps - 5 minutes hold  - hold on to treadmill  GOALS:  Goals reviewed with patient? Yes, initiated, will continue review with completion of further assessment/testing  SHORT TERM GOALS: Target date: 09/19/2023   Patient will be independent in home exercise program to improve dizziness, balance and mobility for better functional independence with ADLs and increased QOL. Baseline: Goal status: INITIAL   LONG TERM GOALS: Target date: 10/17/2023     1.  Patient will reduce dizziness handicap inventory score to <50, for less dizziness with ADLs and increased safety with home and work tasks.  Baseline:  68; 5/12: 46  Goal status: MET  2.  Patient will increase ABC scale score >80% to demonstrate better functional mobility and better confidence with ADLs.  Baseline:  48.69%; 5/12: deferred  Goal status: INITIAL  3.  Patient will increase dynamic gait index score to >22/24 as to demonstrate  improved dynamic gait balance for better safety with community/home ambulation and ability to scan environment without LOB.  Baseline: 21/24; 5/12: 21/24 Goal status: ONGOING  4. The pt will report ability to complete cardio program at home (e.g. with using her treadmill) for at least 10 minutes 3x/week with no greater than mild  dizzy symptoms to return to PLOF.  Baseline: not currently completing cardio program; 5/12: using treadmill but having to use BUE every day until recently (past 2-3  weeks)  Goal status: PARTIALLY MET  5. The pt will exhibit ability to complete at least 10 reps of cervical flexion>neutral cervical position without increase in dizziness symptoms from baseline for improved mobility/ability to complete ADLs.  Baseline: movement currently triggers symptom increase; 5/12: pt reports no dizziness increase   Goal status: MET   ASSESSMENT:  CLINICAL IMPRESSION: Pt returns to PT following absence due to family emergency. As a result, pt will need to take a few weeks away from PT to assist with her daughter's care.  Goals reassessed as a result so pt's functional status and dizziness can be tracked and compared to her return. Pt has now met DHI goal, partially met cardio program goal, and met cervical flexion>neutral dizziness goal. While pt making gains, pt with same score on DGI compared to previous assessment. HEP updated to address deficits found with testing. Patient's condition has the potential to improve in response to therapy. Maximum improvement is yet to be obtained. The anticipated improvement is attainable and reasonable in a generally predictable time.  The pt will benefit from further skilled PT to improve deficits in order to increase balance, QOL, decrease fall risk and dizziness.  OBJECTIVE IMPAIRMENTS: Abnormal gait, decreased activity tolerance, decreased balance, decreased coordination, decreased mobility, difficulty walking, dizziness, improper body mechanics, postural dysfunction, and pain.   ACTIVITY LIMITATIONS: carrying, bending, standing, stairs, bed mobility, locomotion level, and ADLs generally impacted when pt with increased dizzy symptoms  PARTICIPATION LIMITATIONS: meal prep, cleaning, driving, shopping, community activity, and yard work  PERSONAL FACTORS: Age, Sex, Time since onset of injury/illness/exacerbation, and 3+ comorbidities: PMH significant for athritis, breast CA (2006 bilat breast-lobular carcinoma in situ), headache,  anxiety, hx of CVA, HLD, osteoporosis   are also affecting patient's functional outcome.   REHAB POTENTIAL: Good  CLINICAL DECISION MAKING: Evolving/moderate complexity  EVALUATION COMPLEXITY: Moderate   PLAN:  PT FREQUENCY: 1-2x/week  PT DURATION: 8 weeks  PLANNED INTERVENTIONS: 97164- PT Re-evaluation, 97110-Therapeutic exercises, 97530- Therapeutic activity, 97112- Neuromuscular re-education, 97535- Self Care, 31517- Manual therapy, [phone]- Gait training, [phone]- Canalith repositioning, Patient/Family education, Balance training, Stair training, Taping, Joint mobilization, Spinal mobilization, Vestibular training, DME instructions, Cryotherapy, and Moist heat  PLAN FOR NEXT SESSION:strength/conditioning program, manual, habituation, positional testing with vestibular goggles   Samie Crews, PT 08/22/2023, 3:09 PM

## 2023-08-24 ENCOUNTER — Ambulatory Visit

## 2023-08-29 ENCOUNTER — Ambulatory Visit

## 2023-08-31 ENCOUNTER — Ambulatory Visit

## 2023-09-07 ENCOUNTER — Ambulatory Visit

## 2023-09-12 ENCOUNTER — Ambulatory Visit

## 2023-09-14 ENCOUNTER — Ambulatory Visit

## 2023-09-20 ENCOUNTER — Ambulatory Visit

## 2023-09-22 ENCOUNTER — Ambulatory Visit

## 2023-09-27 ENCOUNTER — Ambulatory Visit

## 2023-09-29 DIAGNOSIS — R519 Headache, unspecified: Secondary | ICD-10-CM | POA: Diagnosis not present

## 2023-09-29 DIAGNOSIS — G4733 Obstructive sleep apnea (adult) (pediatric): Secondary | ICD-10-CM | POA: Diagnosis not present

## 2023-09-29 DIAGNOSIS — H9312 Tinnitus, left ear: Secondary | ICD-10-CM | POA: Diagnosis not present

## 2023-09-29 DIAGNOSIS — R42 Dizziness and giddiness: Secondary | ICD-10-CM | POA: Diagnosis not present

## 2023-09-29 DIAGNOSIS — G3184 Mild cognitive impairment, so stated: Secondary | ICD-10-CM | POA: Diagnosis not present

## 2023-10-04 DIAGNOSIS — R519 Headache, unspecified: Secondary | ICD-10-CM | POA: Diagnosis not present

## 2023-10-04 DIAGNOSIS — E782 Mixed hyperlipidemia: Secondary | ICD-10-CM | POA: Diagnosis not present

## 2023-10-04 DIAGNOSIS — I639 Cerebral infarction, unspecified: Secondary | ICD-10-CM | POA: Diagnosis not present

## 2023-10-04 DIAGNOSIS — I2089 Other forms of angina pectoris: Secondary | ICD-10-CM | POA: Diagnosis not present

## 2023-10-04 DIAGNOSIS — G4733 Obstructive sleep apnea (adult) (pediatric): Secondary | ICD-10-CM | POA: Diagnosis not present

## 2023-10-04 DIAGNOSIS — R42 Dizziness and giddiness: Secondary | ICD-10-CM | POA: Diagnosis not present

## 2023-10-04 DIAGNOSIS — G8929 Other chronic pain: Secondary | ICD-10-CM | POA: Diagnosis not present

## 2023-10-05 ENCOUNTER — Ambulatory Visit

## 2023-10-05 ENCOUNTER — Emergency Department
Admission: EM | Admit: 2023-10-05 | Discharge: 2023-10-05 | Disposition: A | Discharge status: Home / Self Care | Attending: Emergency Medicine | Admitting: Emergency Medicine

## 2023-10-05 ENCOUNTER — Encounter: Payer: Self-pay | Admitting: *Deleted

## 2023-10-05 ENCOUNTER — Other Ambulatory Visit: Payer: Self-pay

## 2023-10-05 DIAGNOSIS — Z8673 Personal history of transient ischemic attack (TIA), and cerebral infarction without residual deficits: Secondary | ICD-10-CM | POA: Diagnosis not present

## 2023-10-05 DIAGNOSIS — S61211A Laceration without foreign body of left index finger without damage to nail, initial encounter: Secondary | ICD-10-CM | POA: Insufficient documentation

## 2023-10-05 DIAGNOSIS — Y9389 Activity, other specified: Secondary | ICD-10-CM | POA: Diagnosis not present

## 2023-10-05 DIAGNOSIS — Z23 Encounter for immunization: Secondary | ICD-10-CM | POA: Insufficient documentation

## 2023-10-05 DIAGNOSIS — Z853 Personal history of malignant neoplasm of breast: Secondary | ICD-10-CM | POA: Diagnosis not present

## 2023-10-05 DIAGNOSIS — W540XXA Bitten by dog, initial encounter: Secondary | ICD-10-CM | POA: Diagnosis not present

## 2023-10-05 DIAGNOSIS — T148XXA Other injury of unspecified body region, initial encounter: Secondary | ICD-10-CM

## 2023-10-05 DIAGNOSIS — S61251A Open bite of left index finger without damage to nail, initial encounter: Secondary | ICD-10-CM | POA: Diagnosis not present

## 2023-10-05 MED ORDER — AMOXICILLIN-POT CLAVULANATE 875-125 MG PO TABS: 1.0000 | ORAL_TABLET | Freq: Two times a day (BID) | ORAL | 0 refills | Status: AC

## 2023-10-05 MED ORDER — IBUPROFEN 600 MG PO TABS: 600.0000 mg | ORAL_TABLET | Freq: Three times a day (TID) | ORAL | 0 refills | Status: AC | PRN

## 2023-10-05 MED ORDER — TRAMADOL HCL 50 MG PO TABS
50.0000 mg | ORAL_TABLET | Freq: Once | ORAL | Status: AC
  Administered 2023-10-05: 50 mg via ORAL
  Filled 2023-10-05: qty 1

## 2023-10-05 MED ORDER — TETANUS-DIPHTH-ACELL PERTUSSIS 5-2.5-18.5 LF-MCG/0.5 IM SUSY
0.5000 mL | PREFILLED_SYRINGE | Freq: Once | INTRAMUSCULAR | Status: AC
  Administered 2023-10-05: 0.5 mL via INTRAMUSCULAR
  Filled 2023-10-05: qty 0.5

## 2023-10-05 MED ORDER — AMOXICILLIN-POT CLAVULANATE 875-125 MG PO TABS
1.0000 | ORAL_TABLET | Freq: Once | ORAL | Status: AC
  Administered 2023-10-05: 1 via ORAL
  Filled 2023-10-05: qty 1

## 2023-10-05 NOTE — ED Triage Notes (Signed)
 Pt ambulatory to triage.   Pt has dog bite to right index finger.  2 lacerations to finger.  Bleeding controlled.  Pt alert.

## 2023-10-05 NOTE — ED Notes (Signed)
 Reported to [employer]

## 2023-10-05 NOTE — Discharge Instructions (Addendum)
 You have been diagnosed with dog bite, laceration of the left index finger.  Please take Augmentin  1 tablet by mouth every 12 hours for 7 days.  Can take ibuprofen every 8 hours as needed for pain.  Please check for signs of infection redness, swelling, tenderness.  Please come back to ED or go to your PCP if you have new symptoms or symptoms worsen.

## 2023-10-05 NOTE — ED Provider Notes (Addendum)
 [facility] Provider Note    Event Date/Time   First MD Initiated Contact with Patient 10/05/23 2200     (approximate)   History   Laceration and Animal Bite    HPI  Amanda Osborn is a 68 y.o. female    with a past medical history of vertigo, dizziness, OSA, history of breast cancer,  who presents to the ED complaining of animal bite. According to the patient, she was playing with her dog, she had the ball on her hand and the dog bite her to get.  Dog has immunizations up-to-date.  Per independent chart review last Tdap is due.  Patient is here with her daughter.     Patient Active Problem List   Diagnosis Date Noted   Pelvic pain in female 08/30/2019   Dizziness 03/21/2019   CVA (cerebral vascular accident) (HCC) 12/29/2017   Slurred speech 12/28/2017   Malignant neoplastic disease (HCC) 03/18/2015   Chondromalacia of patella 09/13/2014   Bursitis 09/13/2014   Allergic rhinitis 09/13/2014   Cancer (HCC) 09/13/2014   Cervical disc disease 09/13/2014     ROS: Patient currently denies any vision changes, tinnitus, difficulty speaking, facial droop, sore throat, chest pain, shortness of breath, abdominal pain, nausea/vomiting/diarrhea, dysuria, or weakness/numbness/paresthesias in any extremity   Physical Exam   Triage Vital Signs: ED Triage Vitals  Encounter Vitals Group     BP 10/05/23 2138 101/78     Girls Systolic BP Percentile --      Girls Diastolic BP Percentile --      Boys Systolic BP Percentile --      Boys Diastolic BP Percentile --      Pulse Rate 10/05/23 2138 63     Resp 10/05/23 2138 18     Temp 10/05/23 2138 98.6 F (37 C)     Temp Source 10/05/23 2138 Oral     SpO2 10/05/23 2138 100 %     Weight 10/05/23 2139 102 lb (46.3 kg)     Height 10/05/23 2139 5' 3 (1.6 m)     Head Circumference --      Peak Flow --      Pain Score 10/05/23 2139 7     Pain Loc --      Pain Education --      Exclude from Growth Chart --      Most recent vital signs: Vitals:   10/05/23 2138  BP: 101/78  Pulse: 63  Resp: 18  Temp: 98.6 F (37 C)  SpO2: 100%     Physical Exam Vitals and nursing note reviewed.   Constitutional:      General: Awake and alert. No acute distress.    Appearance: Normal appearance. The patient is normal weight.      Able to speak in complete sentences without cough or dyspnea  HENT:     Head: Normocephalic and atraumatic.     Mouth: Mucous membranes are moist.  Eyes:     General: PERRL. Normal EOMs          Conjunctiva/sclera: Conjunctivae normal.  Nose No congestion/rhinorrhea  CV:                  Good peripheral perfusion.  Regular rate and rhythm  Resp:               Normal effort.  Equal breath sounds bilaterally.  Abd:  No distention.  Soft, nontender.  No rebound or guarding.  Musculoskeletal:        General: No swelling. Normal range of motion.  Right index finger: Presence of laceration in the volar side of the first phalange  and third phalange  of the second finger.  No active bleeding, full ROM, sensation is intact.  No signs of infection  Skin:    General: Skin is warm and dry.     Capillary Refill: Capillary refill takes less than 2 seconds.     Findings: No rash.  Neurological:     Mental Status: The patient is awake and alert. MAE spontaneously. No gross focal neurologic deficits are appreciated.  Psychiatric Mood and affect are normal. Speech and behavior are normal.  ED Results / Procedures / Treatments   Labs (all labs ordered are listed, but only abnormal results are displayed) Labs Reviewed - No data to display   EKG     RADIOLOGY     PROCEDURES:  Critical Care performed:   .Laceration Repair  Date/Time: 10/05/2023 10:48 PM  Performed by: Janit Kast, PA-C Authorized by: Janit Kast, PA-C   Consent:    Consent obtained:  Verbal   Consent given by:  Patient   Risks, benefits, and alternatives were discussed:  yes     Risks discussed:  Infection and pain Universal protocol:    Procedure explained and questions answered to patient or proxy's satisfaction: yes     Patient identity confirmed:  Verbally with patient Anesthesia:    Anesthesia method:  None Laceration details:    Location:  Finger   Finger location:  L index finger   Length (cm):  2 (2 lacerations)   Depth (mm):  2 Exploration:    Limited defect created (wound extended): no     Hemostasis achieved with:  Direct pressure Treatment:    Area cleansed with:  Povidone-iodine   Amount of cleaning:  Extensive   Irrigation solution:  Sterile saline   Irrigation volume:  500   Irrigation method:  Tap   Visualized foreign bodies/material removed: no   Repair type:    Repair type:  Simple Post-procedure details:    Dressing:  Non-adherent dressing and bulky dressing   Procedure completion:  Tolerated well, no immediate complications    MEDICATIONS ORDERED IN ED: Medications  Tdap (BOOSTRIX) injection 0.5 mL (has no administration in time range)  amoxicillin -clavulanate (AUGMENTIN ) 875-125 MG per tablet 1 tablet (has no administration in time range)  traMADol  (ULTRAM ) tablet 50 mg (has no administration in time range)      IMPRESSION / MDM / ASSESSMENT AND PLAN / ED COURSE  I reviewed the triage vital signs and the nursing notes.  Differential diagnosis includes, but is not limited to, dog bite, tendon injury, cellulitis, foreign body, fracture  Patient's presentation is most consistent with acute, uncomplicated illness.   Amanda Osborn is a 68 y.o., female who presents today with history of dog bite while playing with her dog, dog has immunizations up-to-date today, patient is due for Tdap.  Physical exam there is presence of 2 lacerations in the volar side at the level of the 1st and 3rd phalange of the left second finger.  There was no active bleeding.  No signs of infection. Patient's diagnosis is consistent with dog  bite, lacerations at the volar side of the left second finger.  I did not order imaging or labs, physical exam was reassuring. I did review the patient's allergies and  medications.The patient is in stable and satisfactory condition for discharge home. During admission patient received Tdap booster, Augmentin , tramadol  due to pharmacy hours. Patient will be discharged home with prescriptions for Augmentin , ibuprofen. Patient  is to follow up with PCP and wound center as needed or otherwise directed. Patient is given ED precautions to return to the ED for any worsening or new symptoms. Discussed plan of care with patient, answered all of patient's questions, Patient agreeable to plan of care. Advised patient to take medications according to the instructions on the label. Discussed possible side effects of new medications. Patient verbalized understanding.    FINAL CLINICAL IMPRESSION(S) / ED DIAGNOSES   Final diagnoses:  Animal bite  Laceration of left index finger without foreign body without damage to nail, initial encounter     Rx / DC Orders   ED Discharge Orders          Ordered    amoxicillin -clavulanate (AUGMENTIN ) 875-125 MG tablet  2 times daily        10/05/23 2246    ibuprofen (ADVIL) 600 MG tablet  Every 8 hours PRN        10/05/23 2246             Note:  This document was prepared using Dragon voice recognition software and may include unintentional dictation errors.   Janit Kast, PA-C 10/05/23 2249    Janit Kast, PA-C 10/05/23 2251    Floy Roberts, MD 10/05/23 2300

## 2023-10-07 DIAGNOSIS — C801 Malignant (primary) neoplasm, unspecified: Secondary | ICD-10-CM | POA: Diagnosis not present

## 2023-10-07 DIAGNOSIS — Z9889 Other specified postprocedural states: Secondary | ICD-10-CM | POA: Diagnosis not present

## 2023-10-10 ENCOUNTER — Ambulatory Visit

## 2023-10-10 DIAGNOSIS — H903 Sensorineural hearing loss, bilateral: Secondary | ICD-10-CM | POA: Diagnosis not present

## 2023-10-17 ENCOUNTER — Telehealth: Payer: Self-pay

## 2023-10-17 ENCOUNTER — Ambulatory Visit: Attending: Neurology

## 2023-10-17 NOTE — Telephone Encounter (Signed)
 PT contacted pt via secure line due to missed visit/no-show this morning. PT left VM informing pt she does likely need new referral to return therapy, and PT provided information about next visit date/time that is currently still on schedule. PT left clinic number with instruction to please call back clinic if pt would like to hold appointments to get back in to PT or if she'd like to cancel all remaining appointments.  Darryle Patten PT, DPT

## 2023-10-19 ENCOUNTER — Ambulatory Visit

## 2023-10-24 ENCOUNTER — Ambulatory Visit

## 2023-10-26 ENCOUNTER — Ambulatory Visit

## 2023-10-27 DIAGNOSIS — F411 Generalized anxiety disorder: Secondary | ICD-10-CM | POA: Diagnosis not present

## 2023-10-27 DIAGNOSIS — F5105 Insomnia due to other mental disorder: Secondary | ICD-10-CM | POA: Diagnosis not present

## 2023-10-31 ENCOUNTER — Ambulatory Visit

## 2023-10-31 DIAGNOSIS — F5105 Insomnia due to other mental disorder: Secondary | ICD-10-CM | POA: Diagnosis not present

## 2023-10-31 DIAGNOSIS — F411 Generalized anxiety disorder: Secondary | ICD-10-CM | POA: Diagnosis not present

## 2023-11-02 ENCOUNTER — Ambulatory Visit

## 2023-11-07 ENCOUNTER — Ambulatory Visit

## 2023-11-09 ENCOUNTER — Ambulatory Visit

## 2023-11-14 ENCOUNTER — Ambulatory Visit

## 2023-11-16 ENCOUNTER — Ambulatory Visit

## 2023-11-16 DIAGNOSIS — L814 Other melanin hyperpigmentation: Secondary | ICD-10-CM | POA: Diagnosis not present

## 2023-11-16 DIAGNOSIS — D2272 Melanocytic nevi of left lower limb, including hip: Secondary | ICD-10-CM | POA: Diagnosis not present

## 2023-11-16 DIAGNOSIS — D485 Neoplasm of uncertain behavior of skin: Secondary | ICD-10-CM | POA: Diagnosis not present

## 2023-11-16 DIAGNOSIS — D225 Melanocytic nevi of trunk: Secondary | ICD-10-CM | POA: Diagnosis not present

## 2023-11-16 DIAGNOSIS — D0361 Melanoma in situ of right upper limb, including shoulder: Secondary | ICD-10-CM | POA: Diagnosis not present

## 2023-11-16 DIAGNOSIS — D2271 Melanocytic nevi of right lower limb, including hip: Secondary | ICD-10-CM | POA: Diagnosis not present

## 2023-11-16 DIAGNOSIS — D2261 Melanocytic nevi of right upper limb, including shoulder: Secondary | ICD-10-CM | POA: Diagnosis not present

## 2023-11-16 DIAGNOSIS — D2262 Melanocytic nevi of left upper limb, including shoulder: Secondary | ICD-10-CM | POA: Diagnosis not present

## 2023-11-16 DIAGNOSIS — L821 Other seborrheic keratosis: Secondary | ICD-10-CM | POA: Diagnosis not present

## 2023-11-21 ENCOUNTER — Ambulatory Visit

## 2023-11-23 ENCOUNTER — Ambulatory Visit

## 2023-12-13 ENCOUNTER — Other Ambulatory Visit: Payer: Self-pay | Admitting: Family Medicine

## 2023-12-13 DIAGNOSIS — E782 Mixed hyperlipidemia: Secondary | ICD-10-CM

## 2023-12-13 NOTE — Telephone Encounter (Signed)
 Copied from CRM [id number]. Topic: Clinical - Medication Refill >> Dec 13, 2023 11:42 AM Willma R wrote: Medication: rosuvastatin  (CRESTOR ) 20 MG tablet alendronate  (FOSAMAX ) 70 MG tablet  Has the patient contacted their pharmacy? Yes, call dr  This is the patient's preferred pharmacy:  [facility] [address] [place] - [address] [place] [address] Phone: [phone] Fax: [phone]  Is this the correct pharmacy for this prescription? Yes If no, delete pharmacy and type the correct one.   Has the prescription been filled recently? No  Is the patient out of the medication? Yes  Has the patient been seen for an appointment in the last year OR does the patient have an upcoming appointment? Yes  Can we respond through MyChart? Yes  Agent: Please be advised that Rx refills may take up to 3 business days. We ask that you follow-up with your pharmacy.

## 2023-12-14 DIAGNOSIS — D0361 Melanoma in situ of right upper limb, including shoulder: Secondary | ICD-10-CM | POA: Diagnosis not present

## 2023-12-14 MED ORDER — ALENDRONATE SODIUM 70 MG PO TABS: 70.0000 mg | ORAL_TABLET | ORAL | 0 refills | Status: DC

## 2023-12-14 MED ORDER — ROSUVASTATIN CALCIUM 20 MG PO TABS: 20.0000 mg | ORAL_TABLET | Freq: Every day | ORAL | 0 refills | Status: DC

## 2023-12-14 NOTE — Telephone Encounter (Signed)
 Requested medication (s) are due for refill today: yes  Requested medication (s) are on the active medication list: yes  Last refill:  04/01/23  Future visit scheduled: no  Notes to clinic:  Unable to refill per protocol, last refill by another provider. Provider no longer at pratice     Requested Prescriptions  Pending Prescriptions Disp Refills   rosuvastatin  (CRESTOR ) 20 MG tablet 90 tablet 1    Sig: Take 1 tablet (20 mg total) by mouth daily.     Cardiovascular:  Antilipid - Statins 2 Failed - 12/14/2023  8:39 AM      Failed - Valid encounter within last 12 months    Recent Outpatient Visits   None            Failed - Lipid Panel in normal range within the last 12 months    Cholesterol, Total  Date Value Ref Range Status  07/28/2022 148 100 - 199 mg/dL Final   LDL Chol Calc (NIH)  Date Value Ref Range Status  07/28/2022 63 0 - 99 mg/dL Final   HDL  Date Value Ref Range Status  07/28/2022 73 >39 mg/dL Final   Triglycerides  Date Value Ref Range Status  07/28/2022 58 0 - 149 mg/dL Final         Passed - Cr in normal range and within 360 days    Creatinine, Ser  Date Value Ref Range Status  03/01/2023 0.69 0.44 - 1.00 mg/dL Final         Passed - Patient is not pregnant       alendronate  (FOSAMAX ) 70 MG tablet 12 tablet 1    Sig: Take with a full glass of water on an empty stomach.     Endocrinology:  Bisphosphonates Failed - 12/14/2023  8:39 AM      Failed - Vitamin D in normal range and within 360 days    No results found for: CI7874NY7, CI6874NY7, CI874NY7UNU, 25OHVITD3, 25OHVITD2, 25OHVITD1, VD25OH       Failed - Mg Level in normal range and within 360 days    No results found for: MG       Failed - Phosphate in normal range and within 360 days    Phosphorus  Date Value Ref Range Status  05/17/2018 3.3 3.0 - 4.3 mg/dL Final    Comment:                  **Please note reference interval change**         Failed - Valid encounter  within last 12 months    Recent Outpatient Visits   None            Passed - Ca in normal range and within 360 days    Calcium   Date Value Ref Range Status  03/01/2023 9.1 8.9 - 10.3 mg/dL Final         Passed - Cr in normal range and within 360 days    Creatinine, Ser  Date Value Ref Range Status  03/01/2023 0.69 0.44 - 1.00 mg/dL Final         Passed - eGFR is 30 or above and within 360 days    GFR calc Af Amer  Date Value Ref Range Status  09/13/2019 >60 >60 mL/min Final   GFR, Estimated  Date Value Ref Range Status  03/01/2023 >60 >60 mL/min Final    Comment:    (NOTE) Calculated using the CKD-EPI Creatinine Equation (2021)    eGFR  Date Value Ref Range Status  01/28/2023 80 >59 mL/min/1.73 Final         Passed - Bone Mineral Density or Dexa Scan completed in the last 2 years

## 2023-12-19 ENCOUNTER — Telehealth: Payer: Self-pay

## 2023-12-19 DIAGNOSIS — E782 Mixed hyperlipidemia: Secondary | ICD-10-CM

## 2023-12-19 MED ORDER — ROSUVASTATIN CALCIUM 20 MG PO TABS: 20.0000 mg | ORAL_TABLET | Freq: Every day | ORAL | 0 refills | Status: DC

## 2023-12-19 NOTE — Telephone Encounter (Signed)
 Copied from CRM [id number]. Topic: Clinical - Medication Question >> Dec 19, 2023 10:01 AM Avram MATSU wrote: Reason for CRM: rosuvastatin  (CRESTOR ) 20 MG tablet [[id number]] patient stated she sent in a request for a refill and would like to know the status. Please advise [phone]

## 2023-12-28 ENCOUNTER — Ambulatory Visit (INDEPENDENT_AMBULATORY_CARE_PROVIDER_SITE_OTHER): Admitting: Family Medicine

## 2023-12-28 ENCOUNTER — Encounter: Payer: Self-pay | Admitting: Family Medicine

## 2023-12-28 VITALS — BP 104/76 | HR 72 | Ht 63.0 in | Wt 104.4 lb

## 2023-12-28 DIAGNOSIS — M810 Age-related osteoporosis without current pathological fracture: Secondary | ICD-10-CM | POA: Diagnosis not present

## 2023-12-28 DIAGNOSIS — Z79899 Other long term (current) drug therapy: Secondary | ICD-10-CM | POA: Diagnosis not present

## 2023-12-28 DIAGNOSIS — Z9013 Acquired absence of bilateral breasts and nipples: Secondary | ICD-10-CM | POA: Diagnosis not present

## 2023-12-28 DIAGNOSIS — Z8673 Personal history of transient ischemic attack (TIA), and cerebral infarction without residual deficits: Secondary | ICD-10-CM

## 2023-12-28 DIAGNOSIS — G44229 Chronic tension-type headache, not intractable: Secondary | ICD-10-CM | POA: Diagnosis not present

## 2023-12-28 DIAGNOSIS — Z853 Personal history of malignant neoplasm of breast: Secondary | ICD-10-CM | POA: Insufficient documentation

## 2023-12-28 DIAGNOSIS — G43809 Other migraine, not intractable, without status migrainosus: Secondary | ICD-10-CM | POA: Diagnosis not present

## 2023-12-28 DIAGNOSIS — G4733 Obstructive sleep apnea (adult) (pediatric): Secondary | ICD-10-CM | POA: Diagnosis not present

## 2023-12-28 DIAGNOSIS — Z9882 Breast implant status: Secondary | ICD-10-CM | POA: Insufficient documentation

## 2023-12-28 DIAGNOSIS — E782 Mixed hyperlipidemia: Secondary | ICD-10-CM | POA: Diagnosis not present

## 2023-12-28 DIAGNOSIS — G3184 Mild cognitive impairment, so stated: Secondary | ICD-10-CM | POA: Insufficient documentation

## 2023-12-28 DIAGNOSIS — C801 Malignant (primary) neoplasm, unspecified: Secondary | ICD-10-CM | POA: Diagnosis not present

## 2023-12-28 DIAGNOSIS — Z23 Encounter for immunization: Secondary | ICD-10-CM

## 2023-12-28 MED ORDER — ROSUVASTATIN CALCIUM 20 MG PO TABS: 20.0000 mg | ORAL_TABLET | Freq: Every day | ORAL | 1 refills | Status: AC

## 2023-12-28 MED ORDER — ALENDRONATE SODIUM 70 MG PO TABS: 70.0000 mg | ORAL_TABLET | ORAL | 1 refills | Status: AC

## 2023-12-28 NOTE — Assessment & Plan Note (Signed)
 Does not tolerate cpap, managed by Neurology

## 2023-12-28 NOTE — Assessment & Plan Note (Signed)
 Follows Gynecology, who orders her MRI breast for surveillance.

## 2023-12-28 NOTE — Assessment & Plan Note (Signed)
 Lab Results  Component Value Date   LDLCALC 63 07/28/2022   Continue Crestor  20 mg every day, Refilled 90 day supply.

## 2023-12-28 NOTE — Assessment & Plan Note (Signed)
 Likely due to hx of CVA, Follows Neurology, cont Vestibular rehab.

## 2023-12-28 NOTE — Assessment & Plan Note (Signed)
 Dexa scan:  04/2023 :  The BMD measured at Femur Neck Right is 0.635 g/cm2 with a T-score of -2.9. This patient's diagnostic category is OSTEOPOROSIS according to World Health Organization [facility]) criteria. The scan quality is good.   On Fosamax , denies side effects sent. Next Dexa due 2027

## 2023-12-28 NOTE — Assessment & Plan Note (Signed)
 Stable, follows Neurology
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
## 2023-12-29 ENCOUNTER — Ambulatory Visit: Payer: Self-pay | Admitting: Family Medicine

## 2023-12-29 LAB — CBC WITH DIFFERENTIAL/PLATELET
Basophils Absolute: 0 x10E3/uL (ref 0.0–0.2)
Basos: 0 %
EOS (ABSOLUTE): 0.1 x10E3/uL (ref 0.0–0.4)
Eos: 2 %
Hematocrit: 36.7 % (ref 34.0–46.6)
Hemoglobin: 12.1 g/dL (ref 11.1–15.9)
Immature Grans (Abs): 0 x10E3/uL (ref 0.0–0.1)
Immature Granulocytes: 0 %
Lymphocytes Absolute: 1.5 x10E3/uL (ref 0.7–3.1)
Lymphs: 30 %
MCH: 32.5 pg (ref 26.6–33.0)
MCHC: 33 g/dL (ref 31.5–35.7)
MCV: 99 fL — ABNORMAL HIGH (ref 79–97)
Monocytes Absolute: 0.4 x10E3/uL (ref 0.1–0.9)
Monocytes: 9 %
Neutrophils Absolute: 2.9 x10E3/uL (ref 1.4–7.0)
Neutrophils: 59 %
Platelets: 149 x10E3/uL — ABNORMAL LOW (ref 150–450)
RBC: 3.72 x10E6/uL — ABNORMAL LOW (ref 3.77–5.28)
RDW: 12.1 % (ref 11.7–15.4)
WBC: 5 x10E3/uL (ref 3.4–10.8)

## 2023-12-29 LAB — COMPREHENSIVE METABOLIC PANEL WITH GFR
ALT: 33 IU/L — ABNORMAL HIGH (ref 0–32)
AST: 29 IU/L (ref 0–40)
Albumin: 4.6 g/dL (ref 3.9–4.9)
Alkaline Phosphatase: 41 IU/L — ABNORMAL LOW (ref 49–135)
BUN/Creatinine Ratio: 26 (ref 12–28)
BUN: 20 mg/dL (ref 8–27)
Bilirubin Total: 0.4 mg/dL (ref 0.0–1.2)
CO2: 25 mmol/L (ref 20–29)
Calcium: 9.4 mg/dL (ref 8.7–10.3)
Chloride: 104 mmol/L (ref 96–106)
Creatinine, Ser: 0.78 mg/dL (ref 0.57–1.00)
Globulin, Total: 1.6 g/dL (ref 1.5–4.5)
Glucose: 77 mg/dL (ref 70–99)
Potassium: 4.1 mmol/L (ref 3.5–5.2)
Sodium: 143 mmol/L (ref 134–144)
Total Protein: 6.2 g/dL (ref 6.0–8.5)
eGFR: 82 mL/min/1.73 (ref >59)

## 2023-12-29 LAB — LIPID PANEL
Chol/HDL Ratio: 2.1 ratio (ref 0.0–4.4)
Cholesterol, Total: 148 mg/dL (ref 100–199)
HDL: 72 mg/dL (ref >39)
LDL Chol Calc (NIH): 63 mg/dL (ref 0–99)
Triglycerides: 61 mg/dL (ref 0–149)
VLDL Cholesterol Cal: 13 mg/dL (ref 5–40)

## 2024-01-24 ENCOUNTER — Telehealth: Payer: Self-pay | Admitting: Family Medicine

## 2024-01-24 NOTE — Telephone Encounter (Signed)
 Copied from CRM #[id number]. Topic: Medicare AWV >> Jan 24, 2024  9:03 AM Nathanel DEL wrote: Reason for CRM: Called LVM 01/24/2024 to change appts to office visits only due to Medicare Part B changes   Nathanel Paschal; Care Guide Ambulatory Clinical Support [place] [facility] Direct Dial: [phone]

## 2024-01-24 NOTE — Telephone Encounter (Signed)
 Copied from CRM [id number]. Topic: Medicare AWV >> Jan 24, 2024  1:24 PM Nathanel DEL wrote: Reason for CRM: LVM 01/24/24 to change AWV appt from telephone to office due to Traditional Medicare changes. All Medicare A/B patient are to do office visits only   Nathanel Paschal; Care Guide Ambulatory Clinical Support [place] [facility] Direct Dial: [phone]

## 2024-01-25 ENCOUNTER — Ambulatory Visit (INDEPENDENT_AMBULATORY_CARE_PROVIDER_SITE_OTHER)

## 2024-01-25 VITALS — BP 120/72 | Ht 63.0 in | Wt 104.6 lb

## 2024-01-25 DIAGNOSIS — Z0000 Encounter for general adult medical examination without abnormal findings: Secondary | ICD-10-CM

## 2024-01-25 NOTE — Progress Notes (Signed)
 Subjective:   MEKAELA AZIZI is a 68 y.o. who presents for a Medicare Wellness preventive visit.  As a reminder, Annual Wellness Visits don't include a physical exam, and some assessments may be limited, especially if this visit is performed virtually. We may recommend an in-person follow-up visit with your provider if needed.  Visit Complete: In person  Persons Participating in Visit: Patient.  AWV Questionnaire: No: Patient Medicare AWV questionnaire was not completed prior to this visit.  Cardiac Risk Factors include: advanced age (>40men, >42 women);dyslipidemia;sedentary lifestyle     Objective:    Today's Vitals   01/25/24 1013 01/25/24 1016  BP: 120/72   Weight: 104 lb 9.6 oz (47.4 kg)   Height: 5' 3 (1.6 m)   PainSc:  4    Body mass index is 18.53 kg/m.     01/25/2024   10:31 AM 10/05/2023    9:39 PM 03/01/2023    4:35 AM 01/19/2023   10:09 AM 01/07/2021    2:54 PM 01/07/2020    3:01 PM 09/21/2019    7:47 AM  Advanced Directives  Does Patient Have a Medical Advance Directive? No No No;Yes No Yes Yes Yes  Type of Advance Directive     Living will Healthcare Power of [place];Living will Living will  Copy of Healthcare Power of Attorney in Chart?      No - copy requested   Would patient like information on creating a medical advance directive? No - Patient declined   No - Patient declined       Current Medications (verified) Outpatient Encounter Medications as of 01/25/2024  Medication Sig   acyclovir  (ZOVIRAX ) 400 MG tablet Take 400 mg by mouth 2 (two) times daily as needed (fever blister).    alendronate  (FOSAMAX ) 70 MG tablet Take 1 tablet (70 mg total) by mouth once a week. Take with a full glass of water on an empty stomach.   aspirin  EC 325 MG EC tablet Take 1 tablet (325 mg total) by mouth daily.   buPROPion (WELLBUTRIN SR) 100 MG 12 hr tablet Take 100 mg by mouth every morning.    Cholecalciferol (VITAMIN D-3) 125 MCG (5000 UT) TABS Take 5,000 Units  by mouth daily.   meclizine  (ANTIVERT ) 25 MG tablet Take 1 tablet (25 mg total) by mouth 3 (three) times daily as needed for dizziness.   Multiple Vitamin (MULTIVITAMIN WITH MINERALS) TABS tablet Take 1 tablet by mouth daily. Alive Multivitamin   rosuvastatin  (CRESTOR ) 20 MG tablet Take 1 tablet (20 mg total) by mouth daily.   scopolamine (TRANSDERM-SCOP) 1 MG/3DAYS Place 1 patch onto the skin every 3 (three) days. (Patient taking differently: Place 1 patch onto the skin every 3 (three) days. TAKES PRN)   sertraline  (ZOLOFT ) 50 MG tablet Take 1.5 tablets (75 mg total) by mouth daily.   traZODone  (DESYREL ) 50 MG tablet Take 1.5 tablets (75 mg total) by mouth at bedtime.   triamcinolone  (NASACORT ) 55 MCG/ACT AERO nasal inhaler Place 2 sprays into the nose daily.   No facility-administered encounter medications on file as of 01/25/2024.    Allergies (verified) Montelukast  sodium   History: Past Medical History:  Diagnosis Date   Allergy    Anxiety    Arthritis    Breast cancer (HCC) 2006   BIL BREASTS-LOBULAR CARCINOMA IN SITU   Dizziness    Headache    History of CVA (cerebrovascular accident)    Hyperlipidemia    Osteoporosis    Stroke (HCC) 2019  NO RESIDUAL EFFECTS   Past Surgical History:  Procedure Laterality Date   AUGMENTATION MAMMAPLASTY Bilateral 2011   BREAST CYST ASPIRATION Bilateral    BREAST EXCISIONAL BIOPSY Left 2006   breast ca   BREAST LUMPECTOMY     CARDIAC CATHETERIZATION     COLONOSCOPY     COLONOSCOPY WITH PROPOFOL  N/A 06/28/2016   Procedure: COLONOSCOPY WITH PROPOFOL ;  Surgeon: Gladis RAYMOND Mariner, MD;  Location: [facility] ENDOSCOPY;  Service: Endoscopy;  Laterality: N/A;   CYSTOSCOPY W/ URETERAL STENT PLACEMENT Left 09/21/2019   Procedure: CYSTOSCOPY WITH RETROGRADE PYELOGRAM/URETERAL STENT PLACEMENT;  Surgeon: Francisca Redell BROCKS, MD;  Location: [facility] ORS;  Service: Urology;  Laterality: Left;   LYMPH NODE BIOPSY     NECK   MASTECTOMY Bilateral    TEE  WITHOUT CARDIOVERSION N/A 12/30/2017   Procedure: TRANSESOPHAGEAL ECHOCARDIOGRAM (TEE);  Surgeon: Bosie Vinie LABOR, MD;  Location: [facility] ORS;  Service: Cardiovascular;  Laterality: N/A;   TOTAL VAGINAL HYSTERECTOMY     URETEROSCOPY Left 09/21/2019   Procedure: URETEROSCOPY-DIAGNOSTIC;  Surgeon: Francisca Redell BROCKS, MD;  Location: [facility] ORS;  Service: Urology;  Laterality: Left;   VAGINAL HYSTERECTOMY     Family History  Problem Relation Age of Onset   Breast cancer Paternal Aunt    Breast cancer Paternal Grandmother    Dementia Mother        passed at age 23   Leukemia Father        passed at age 81   Leukemia Brother    Social History   Socioeconomic History   Marital status: Married    Spouse name: Neida Ellegood   Number of children: 2   Years of education: college   Highest education level: Bachelor's degree (e.g., BA, AB, BS)  Occupational History   Occupation: retired from Designer, industrial/product work  Tobacco Use   Smoking status: Never   Smokeless tobacco: Never  Vaping Use   Vaping status: Never Used  Substance and Sexual Activity   Alcohol use: No    Alcohol/week: 0.0 standard drinks of alcohol   Drug use: No   Sexual activity: Yes  Other Topics Concern   Not on file  Social History Narrative   Lives at home with her husband, Alm.   Right-handed.   No caffeine use.   Social Drivers of Corporate investment banker Strain: Low Risk  (01/25/2024)   Overall Financial Resource Strain (CARDIA)    Difficulty of Paying Living Expenses: Not hard at all  Food Insecurity: No Food Insecurity (01/25/2024)   Hunger Vital Sign    Worried About Running Out of Food in the Last Year: Never true    Ran Out of Food in the Last Year: Never true  Transportation Needs: No Transportation Needs (01/25/2024)   PRAPARE - Administrator, Civil Service (Medical): No    Lack of Transportation (Non-Medical): No  Physical Activity: Insufficiently Active (01/25/2024)   Exercise Vital  Sign    Days of Exercise per Week: 2 days    Minutes of Exercise per Session: 30 min  Stress: Stress Concern Present (01/25/2024)   [employer] of Occupational Health - Occupational Stress Questionnaire    Feeling of Stress: To some extent  Social Connections: Socially Integrated (01/25/2024)   Social Connection and Isolation Panel    Frequency of Communication with Friends and Family: More than three times a week    Frequency of Social Gatherings with Friends and Family: More than three times a week  Attends Religious Services: More than 4 times per year    Active Member of Clubs or Organizations: Yes    Attends Engineer, structural: More than 4 times per year    Marital Status: Married    Tobacco Counseling Counseling given: Not Answered    Clinical Intake:  Pre-visit preparation completed: Yes  Pain : 0-10 Pain Score: 4  Pain Type: Chronic pain Pain Location: Back Pain Orientation: Lower Pain Descriptors / Indicators: Aching, Constant Pain Onset: More than a month ago Pain Frequency: Constant     BMI - recorded: 18.53 Nutritional Status: BMI <19  Underweight Nutritional Risks: None Diabetes: No  Lab Results  Component Value Date   HGBA1C 5.3 12/29/2017     How often do you need to have someone help you when you read instructions, pamphlets, or other written materials from your doctor or pharmacy?: 1 - Never  Interpreter Needed?: No  Information entered by :: JHONNIE DAS, LPN   Activities of Daily Living    01/25/2024   10:34 AM  In your present state of health, do you have any difficulty performing the following activities:  Hearing? 0  Vision? 0  Difficulty concentrating or making decisions? 1  Comment MEMORY  Walking or climbing stairs? 0  Dressing or bathing? 0  Doing errands, shopping? 0  Preparing Food and eating ? N  Using the Toilet? N  In the past six months, have you accidently leaked urine? N  Do you have problems  with loss of bowel control? N  Managing your Medications? N  Managing your Finances? N  Housekeeping or managing your Housekeeping? N    Patient Care Team: Kotturi, Vinay K, MD as PCP - General (Family Medicine) Schermerhorn, Debby PARAS, MD as Referring Physician (Obstetrics and Gynecology) Pa, [facility] (Optometry)  I have updated your Care Teams any recent Medical Services you may have received from other providers in the past year.     Assessment:   This is a routine wellness examination for Yailine.  Hearing/Vision screen Hearing Screening - Comments:: NO AIDS- ENT TESTED AND SAID SHE HAD NO HEARING LOSS Vision Screening - Comments:: READERS- [facility]- SEEN EVERY 2 YEARS   Goals Addressed             This Visit's Progress    DIET - INCREASE WATER INTAKE         Depression Screen     01/25/2024   10:26 AM 12/28/2023   11:14 AM 04/01/2023    1:54 PM 02/07/2023    1:30 PM 01/19/2023   10:07 AM 07/28/2022   10:08 AM 02/19/2022    8:26 AM  PHQ 2/9 Scores  PHQ - 2 Score 1 2 2 1 1  0 0  PHQ- 9 Score 3 11 10 2 2  0 0    Fall Risk     01/25/2024   10:33 AM 12/28/2023   11:14 AM 04/01/2023    1:54 PM 02/07/2023    1:30 PM 01/19/2023   10:10 AM  Fall Risk   Falls in the past year? 1 0 0 0 0  Number falls in past yr: 1 0 0 0 0  Comment DIZZINESS      Injury with Fall? 0 0 0 0 0  Risk for fall due to : History of fall(s);Other (Comment) No Fall Risks No Fall Risks No Fall Risks No Fall Risks  Risk for fall due to: Comment HAD STROKE IN 2018, GETS DIZZY OFTEN  Follow up Falls evaluation completed;Falls prevention discussed Falls evaluation completed Falls evaluation completed Falls evaluation completed Falls prevention discussed;Falls evaluation completed    MEDICARE RISK AT HOME:  Medicare Risk at Home Any stairs in or around the home?: Yes If so, are there any without handrails?: No Home free of loose throw rugs in walkways, pet beds, electrical  cords, etc?: Yes Adequate lighting in your home to reduce risk of falls?: Yes Life alert?: No Use of a cane, walker or w/c?: No Grab bars in the bathroom?: Yes Shower chair or bench in shower?: Yes Elevated toilet seat or a handicapped toilet?: Yes  TIMED UP AND GO:  Was the test performed?  Yes  Length of time to ambulate 10 feet: 4 sec Gait steady and fast without use of assistive device  Cognitive Function: 6CIT completed        01/25/2024   10:36 AM 01/19/2023   10:11 AM 01/15/2022    3:35 PM 01/07/2020    3:03 PM  6CIT Screen  What Year? 0 points 0 points 0 points 0 points  What month? 0 points 0 points 0 points 0 points  What time? 0 points 0 points 0 points 0 points  Count back from 20 0 points 0 points 2 points 0 points  Months in reverse 0 points 0 points 0 points 0 points  Repeat phrase 0 points 0 points 2 points 0 points  Total Score 0 points 0 points 4 points 0 points    Immunizations Immunization History  Administered Date(s) Administered   Fluad Quad(high Dose 65+) 12/21/2018, 01/21/2021   INFLUENZA, HIGH DOSE SEASONAL PF 12/28/2023   Influenza,inj,Quad PF,6+ Mos 12/29/2017   Influenza-Unspecified 01/11/2016, 12/29/2017   PFIZER(Purple Top)SARS-COV-2 Vaccination 05/28/2019, 06/18/2019, 03/20/2020   PNEUMOCOCCAL CONJUGATE-20 08/08/2020   Pneumococcal Conjugate-13 12/28/2018   Tdap 10/05/2023    Screening Tests Health Maintenance  Topic Date Due   Hepatitis C Screening  Never done   Zoster Vaccines- Shingrix (1 of 2) Never done   Medicare Annual Wellness (AWV)  01/24/2025   DEXA SCAN  04/19/2025   Colonoscopy  06/29/2026   Pneumococcal Vaccine: 50+ Years  Completed   Influenza Vaccine  Completed   Meningococcal B Vaccine  Aged Out   DTaP/Tdap/Td  Discontinued   Mammogram  Discontinued   COVID-19 Vaccine  Discontinued    Health Maintenance Items Addressed: NEEDS SHINGRIX, DOESN'T WANT ANYMORE COVID; UP TO DATE ON COLONOSCOPY & BDS; HAD BREAST CA  AND GETS MRI (ORDERED BY GYN MED)   Additional Screening:  Vision Screening: Recommended annual ophthalmology exams for early detection of glaucoma and other disorders of the eye. Is the patient up to date with their annual eye exam?  Yes  Who is the provider or what is the name of the office in which the patient attends annual eye exams? [facility]  Dental Screening: Recommended annual dental exams for proper oral hygiene  Community Resource Referral / Chronic Care Management: CRR required this visit?  No   CCM required this visit?  No   Plan:    I have personally reviewed and noted the following in the patient's chart:   Medical and social history Use of alcohol, tobacco or illicit drugs  Current medications and supplements including opioid prescriptions. Patient is not currently taking opioid prescriptions. Functional ability and status Nutritional status Physical activity Advanced directives List of other physicians Hospitalizations, surgeries, and ER visits in previous 12 months Vitals Screenings to include cognitive, depression, and falls Referrals  and appointments  In addition, I have reviewed and discussed with patient certain preventive protocols, quality metrics, and best practice recommendations. A written personalized care plan for preventive services as well as general preventive health recommendations were provided to patient.   Jhonnie GORMAN Das, LPN   [id number]   After Visit Summary: (In Person-Declined) Patient declined AVS at this time.  Notes: Nothing significant to report at this time.

## 2024-01-25 NOTE — Patient Instructions (Addendum)
 Amanda Osborn,  Thank you for taking the time for your Medicare Wellness Visit. I appreciate your continued commitment to your health goals. Please review the care plan we discussed, and feel free to reach out if I can assist you further.  Medicare recommends these wellness visits once per year to help you and your care team stay ahead of potential health issues. These visits are designed to focus on prevention, allowing your provider to concentrate on managing your acute and chronic conditions during your regular appointments.  Please note that Annual Wellness Visits do not include a physical exam. Some assessments may be limited, especially if the visit was conducted virtually. If needed, we may recommend a separate in-person follow-up with your provider.  Ongoing Care Seeing your primary care provider every 3 to 6 months helps us  monitor your health and provide consistent, personalized care.   Referrals If a referral was made during today's visit and you haven't received any updates within two weeks, please contact the referred provider directly to check on the status.  Recommended Screenings:  Health Maintenance  Topic Date Due   Hepatitis C Screening  Never done   Zoster (Shingles) Vaccine (1 of 2) Never done   Medicare Annual Wellness Visit  01/24/2025   DEXA scan (bone density measurement)  04/19/2025   Colon Cancer Screening  06/29/2026   Pneumococcal Vaccine for age over 95  Completed   Flu Shot  Completed   Meningitis B Vaccine  Aged Out   DTaP/Tdap/Td vaccine  Discontinued   Breast Cancer Screening  Discontinued   COVID-19 Vaccine  Discontinued     Advance Care Planning is important because it: Ensures you receive medical care that aligns with your values, goals, and preferences. Provides guidance to your family and loved ones, reducing the emotional burden of decision-making during critical moments.  Vision: Annual vision screenings are recommended for early detection  of glaucoma, cataracts, and diabetic retinopathy. These exams can also reveal signs of chronic conditions such as diabetes and high blood pressure.  Dental: Annual dental screenings help detect early signs of oral cancer, gum disease, and other conditions linked to overall health, including heart disease and diabetes.  Please see the attached documents for additional preventive care recommendations.    NEXT AWV 02/06/25 @ 10:10 AM IN PERSON

## 2024-02-24 DIAGNOSIS — H2513 Age-related nuclear cataract, bilateral: Secondary | ICD-10-CM | POA: Diagnosis not present

## 2024-06-26 ENCOUNTER — Ambulatory Visit: Admitting: Family Medicine

## 2025-02-06 ENCOUNTER — Ambulatory Visit
